# Patient Record
Sex: Female | Born: 1985 | Race: White | Hispanic: No
Health system: Southern US, Community
[De-identification: ages and names within clinical notes are randomized; demographics above are authoritative.]

## PROBLEM LIST (undated history)

## (undated) ENCOUNTER — Inpatient Hospital Stay (HOSPITAL_COMMUNITY): Payer: Self-pay

## (undated) DIAGNOSIS — Z8719 Personal history of other diseases of the digestive system: Secondary | ICD-10-CM

## (undated) DIAGNOSIS — G479 Sleep disorder, unspecified: Secondary | ICD-10-CM

## (undated) DIAGNOSIS — O99419 Diseases of the circulatory system complicating pregnancy, unspecified trimester: Secondary | ICD-10-CM

## (undated) DIAGNOSIS — R002 Palpitations: Secondary | ICD-10-CM

## (undated) DIAGNOSIS — I10 Essential (primary) hypertension: Secondary | ICD-10-CM

## (undated) DIAGNOSIS — M419 Scoliosis, unspecified: Secondary | ICD-10-CM

## (undated) DIAGNOSIS — I059 Rheumatic mitral valve disease, unspecified: Secondary | ICD-10-CM

## (undated) DIAGNOSIS — F419 Anxiety disorder, unspecified: Secondary | ICD-10-CM

## (undated) HISTORY — DX: Rheumatic mitral valve disease, unspecified: I05.9

## (undated) HISTORY — DX: Sleep disorder, unspecified: G47.9

## (undated) HISTORY — DX: Rheumatic mitral valve disease, unspecified: O99.419

## (undated) HISTORY — DX: Palpitations: R00.2

---

## 2012-05-18 ENCOUNTER — Emergency Department (HOSPITAL_COMMUNITY)
Admission: EM | Admit: 2012-05-18 | Discharge: 2012-05-18 | Discharge status: Against Medical Advice | Payer: 59 | Attending: Emergency Medicine | Admitting: Emergency Medicine

## 2012-05-18 ENCOUNTER — Encounter (HOSPITAL_COMMUNITY): Payer: Self-pay | Admitting: *Deleted

## 2012-05-18 DIAGNOSIS — R300 Dysuria: Secondary | ICD-10-CM | POA: Insufficient documentation

## 2012-05-18 DIAGNOSIS — M549 Dorsalgia, unspecified: Secondary | ICD-10-CM | POA: Insufficient documentation

## 2012-05-18 HISTORY — DX: Scoliosis, unspecified: M41.9

## 2012-05-18 LAB — URINALYSIS, ROUTINE W REFLEX MICROSCOPIC
Leukocytes, UA: NEGATIVE
Protein, ur: NEGATIVE mg/dL
Specific Gravity, Urine: 1.022 (ref 1.005–1.030)
Urobilinogen, UA: 0.2 mg/dL (ref 0.0–1.0)

## 2012-05-18 LAB — POCT PREGNANCY, URINE: Preg Test, Ur: NEGATIVE

## 2012-05-18 NOTE — ED Notes (Signed)
PT reports back pain that radiates into both legs. PT also reports dysuria, blood in stools ,sinus pain and increased gas. PCP last seen 3 weeks ago.

## 2012-05-24 ENCOUNTER — Ambulatory Visit
Admission: RE | Admit: 2012-05-24 | Discharge: 2012-05-24 | Disposition: A | Discharge status: Home / Self Care | Payer: 59 | Source: Ambulatory Visit | Attending: Family Medicine | Admitting: Family Medicine

## 2012-05-24 ENCOUNTER — Other Ambulatory Visit: Payer: Self-pay | Admitting: Family Medicine

## 2012-05-24 DIAGNOSIS — M545 Low back pain: Secondary | ICD-10-CM

## 2012-07-26 ENCOUNTER — Ambulatory Visit (INDEPENDENT_AMBULATORY_CARE_PROVIDER_SITE_OTHER): Payer: 59 | Admitting: Internal Medicine

## 2012-07-26 ENCOUNTER — Encounter: Payer: Self-pay | Admitting: *Deleted

## 2012-07-26 ENCOUNTER — Encounter: Payer: Self-pay | Admitting: Internal Medicine

## 2012-07-26 VITALS — BP 162/89 | HR 96 | Ht 63.0 in | Wt 174.4 lb

## 2012-07-26 DIAGNOSIS — I059 Rheumatic mitral valve disease, unspecified: Secondary | ICD-10-CM

## 2012-07-26 DIAGNOSIS — I340 Nonrheumatic mitral (valve) insufficiency: Secondary | ICD-10-CM

## 2012-07-26 NOTE — Patient Instructions (Addendum)
Your physician has requested that you have an echocardiogram. Echocardiography is a painless test that uses sound waves to create images of your heart. It provides your doctor with information about the size and shape of your heart and how well your heart's chambers and valves are working. This procedure takes approximately one hour. There are no restrictions for this procedure.   

## 2012-07-26 NOTE — Progress Notes (Signed)
HPI Patient says when she was notcing palpitations more.  Feels weird flopping.  Notices while resting.  Feels pounding. Does not feel them when she is active.  Denies SOB with activity.  No dizziness.   Had when had acid reflux.  Feels may be worse. Anxiety in past would make worse. Does water aerobics 3x per wk  An hour each visit.  No problems  No SOB  No palpitatons.  No chest tightness.  Allergies  Allergen Reactions  . Amoxicillin Itching    Current Outpatient Prescriptions  Medication Sig Dispense Refill  . bifidobacterium infantis (ALIGN) capsule Take 1 capsule by mouth daily.      . cetirizine (ZYRTEC) 10 MG tablet Take 10 mg by mouth daily.      . lansoprazole (PREVACID) 15 MG capsule Take 15 mg by mouth daily.      . Prenatal Vit-Fe Fumarate-FA (PRENATAL MULTIVITAMIN) TABS Take 1 tablet by mouth daily.        Past Medical History  Diagnosis Date  . Hiatal hernia   . Scoliosis   . Palpitations   . Trouble in sleeping     No past surgical history on file.  No family history on file.  History   Social History  . Marital Status: Married    Spouse Name: N/A    Number of Children: N/A  . Years of Education: N/A   Occupational History  . Not on file.   Social History Main Topics  . Smoking status: Never Smoker   . Smokeless tobacco: Not on file  . Alcohol Use: No  . Drug Use: No  . Sexually Active: Yes    Birth Control/ Protection: None   Other Topics Concern  . Not on file   Social History Narrative  . No narrative on file    Review of Systems:  All systems reviewed.  They are negative to the above problem except as previously stated.  Vital Signs: BP 162/89  Pulse 96  Ht 5\' 3"  (1.6 m)  Wt 174 lb 6.4 oz (79.107 kg)  BMI 30.89 kg/m2  Physical Exam Patient is in NAD HEENT:  Normocephalic, atraumatic. EOMI, PERRLA.  Neck: JVP is normal.  No bruits.  Lungs: clear to auscultation. No rales no wheezes.  Heart: Regular rate and rhythm. Normal S1,  S2. No S3.   No significant murmurs. PMI not displaced.  Abdomen:  Supple, nontender. Normal bowel sounds. No masses. No hepatomegaly.  Extremities:   Good distal pulses throughout. No lower extremity edema.  Musculoskeletal :moving all extremities.  Neuro:   alert and oriented x3.  CN II-XII grossly intact.  EKG:  SR  96 bpm.   Assessment and Plan:  Palpitations.  I am not convinced they are hemodynamically signif arrhythmia.  She notices them when she is quiet  Short lived I would recomm staying active.  Get adequate fluids.  Try relaxing 30 min before bedtime with legs up  See if helps  Discuss reflux meds with OB  SHe has had an echo in the past that by report said she had mild to moderate MR  I do not hear today  With that finding I would recomm a repeat echo to evla.

## 2012-07-31 ENCOUNTER — Ambulatory Visit (HOSPITAL_COMMUNITY): Payer: 59 | Attending: Internal Medicine

## 2012-07-31 DIAGNOSIS — Z331 Pregnant state, incidental: Secondary | ICD-10-CM | POA: Insufficient documentation

## 2012-07-31 DIAGNOSIS — R002 Palpitations: Secondary | ICD-10-CM

## 2012-07-31 DIAGNOSIS — I340 Nonrheumatic mitral (valve) insufficiency: Secondary | ICD-10-CM

## 2012-07-31 NOTE — Progress Notes (Signed)
Echocardiogram performed.  

## 2012-08-05 ENCOUNTER — Telehealth: Payer: Self-pay | Admitting: Internal Medicine

## 2012-08-05 NOTE — Telephone Encounter (Signed)
Called patient with echo results.

## 2012-08-05 NOTE — Telephone Encounter (Signed)
PT HAD ECHO 07-31-12, WOULD LIKE RESULTS

## 2012-08-06 LAB — OB RESULTS CONSOLE RUBELLA ANTIBODY, IGM: Rubella: IMMUNE

## 2012-08-06 LAB — OB RESULTS CONSOLE GC/CHLAMYDIA
Chlamydia: NEGATIVE
Gonorrhea: NEGATIVE

## 2012-08-06 LAB — OB RESULTS CONSOLE ANTIBODY SCREEN: Antibody Screen: NEGATIVE

## 2012-08-06 LAB — OB RESULTS CONSOLE ABO/RH

## 2012-10-13 ENCOUNTER — Inpatient Hospital Stay (HOSPITAL_COMMUNITY)
Admission: AD | Admit: 2012-10-13 | Discharge: 2012-10-13 | Disposition: A | Discharge status: Home / Self Care | Payer: 59 | Source: Ambulatory Visit | Attending: Obstetrics and Gynecology | Admitting: Obstetrics and Gynecology

## 2012-10-13 ENCOUNTER — Encounter (HOSPITAL_COMMUNITY): Payer: Self-pay | Admitting: Obstetrics and Gynecology

## 2012-10-13 DIAGNOSIS — K529 Noninfective gastroenteritis and colitis, unspecified: Secondary | ICD-10-CM

## 2012-10-13 DIAGNOSIS — M549 Dorsalgia, unspecified: Secondary | ICD-10-CM | POA: Insufficient documentation

## 2012-10-13 DIAGNOSIS — K5289 Other specified noninfective gastroenteritis and colitis: Secondary | ICD-10-CM

## 2012-10-13 DIAGNOSIS — O210 Mild hyperemesis gravidarum: Secondary | ICD-10-CM | POA: Insufficient documentation

## 2012-10-13 LAB — URINALYSIS, ROUTINE W REFLEX MICROSCOPIC
Glucose, UA: NEGATIVE mg/dL
Hgb urine dipstick: NEGATIVE
Ketones, ur: >80 mg/dL — AB
pH: 6 (ref 5.0–8.0)

## 2012-10-13 MED ORDER — LACTATED RINGERS IV BOLUS (SEPSIS)
1000.0000 mL | Freq: Once | INTRAVENOUS | Status: AC
  Administered 2012-10-13: 1000 mL via INTRAVENOUS

## 2012-10-13 MED ORDER — PROMETHAZINE HCL 25 MG/ML IJ SOLN
25.0000 mg | Freq: Once | INTRAVENOUS | Status: AC
  Administered 2012-10-13: 25 mg via INTRAVENOUS
  Filled 2012-10-13: qty 1

## 2012-10-13 MED ORDER — PROMETHAZINE HCL 25 MG PO TABS: 25.0000 mg | ORAL_TABLET | Freq: Four times a day (QID) | ORAL | Status: DC | PRN

## 2012-10-13 NOTE — MAU Note (Signed)
[name] is [redacted]w[redacted]d presents to MAU with chief complaint of vomiting X 3 days. No diarrhea; however stools are more "loose" than normal. Denies fever, says she is having some back pain. Shes vomited 4 times in the last 24 hours. Denies fever.

## 2012-10-13 NOTE — MAU Provider Note (Signed)
Chief Complaint: Emesis  First Provider Initiated Contact with Patient 10/13/12 0901      SUBJECTIVE HPI: [name] is a 27 y.o. G1P0 at [redacted]w[redacted]d by LMP who presents with vomiting and loose stools X 3 days. Different that N/V of pregnancy. Denies fever, chills, sick contacts, VB or LOF. Reports mild back pain and cramping.   Past Medical History  Diagnosis Date  . Hiatal hernia   . Scoliosis   . Palpitations   . Trouble in sleeping    OB History    Grav Para Term Preterm Abortions TAB SAB Ect Mult Living   1              # Outc Date GA Lbr Len/2nd Wgt Sex Del Anes PTL Lv   1 CUR              Past Surgical History  Procedure Date  . No past surgeries    History   Social History  . Marital Status: Married    Spouse Name: N/A    Number of Children: N/A  . Years of Education: N/A   Occupational History  . Not on file.   Social History Main Topics  . Smoking status: Never Smoker   . Smokeless tobacco: Not on file  . Alcohol Use: No  . Drug Use: No  . Sexually Active: Yes    Birth Control/ Protection: None   Other Topics Concern  . Not on file   Social History Narrative  . No narrative on file   No current facility-administered medications on file prior to encounter.   Current Outpatient Prescriptions on File Prior to Encounter  Medication Sig Dispense Refill  . omeprazole (PRILOSEC OTC) 20 MG tablet Take 20 mg by mouth daily.      . Prenatal Vit-Fe Fumarate-FA (PRENATAL MULTIVITAMIN) TABS Take 1 tablet by mouth daily.      . sertraline (ZOLOFT) 50 MG tablet Take 50 mg by mouth daily.      . promethazine (PHENERGAN) 25 MG tablet Take 1 tablet (25 mg total) by mouth every 6 (six) hours as needed for nausea.  30 tablet  1   Allergies  Allergen Reactions  . Amoxicillin Itching    ROS: Pertinent items in HPI  OBJECTIVE Blood pressure 127/90, pulse 130, temperature 97.9 F (36.6 C), resp. rate 18, last menstrual period 04/17/2012. 12:05 Pulse 96 after IV  fluids. Pulse: 97 GENERAL: Well-developed, well-nourished female in no acute distress, ill-appearing HEENT: Normocephalic HEART: Tachycardic RESP: normal effort ABDOMEN: Soft, non-tender EXTREMITIES: Nontender, no edema NEURO: Alert and oriented SPECULUM EXAM: deferred BIMANUAL: cervix long and closed; uterus S=D. FHR 152 by doppler.  LAB RESULTS Results for orders placed during the hospital encounter of 10/13/12 (from the past 24 hour(s))  URINALYSIS, ROUTINE W REFLEX MICROSCOPIC     Status: Abnormal   Collection Time   10/13/12  8:30 AM      Component Value Range   Color, Urine YELLOW  YELLOW   APPearance CLEAR  CLEAR   Specific Gravity, Urine >1.030 (*) 1.005 - 1.030   pH 6.0  5.0 - 8.0   Glucose, UA NEGATIVE  NEGATIVE mg/dL   Hgb urine dipstick NEGATIVE  NEGATIVE   Bilirubin Urine SMALL (*) NEGATIVE   Ketones, ur >80 (*) NEGATIVE mg/dL   Protein, ur NEGATIVE  NEGATIVE mg/dL   Urobilinogen, UA 0.2  0.0 - 1.0 mg/dL   Nitrite NEGATIVE  NEGATIVE   Leukocytes, UA NEGATIVE  NEGATIVE  IMAGING No results found.  MAU COURSE Feeling better and tolerating POs after Phenergan and IV fluids.   ASSESSMENT 1. Gastroenteritis, acute   2. Other current maternal conditions classifiable elsewhere, antepartum    PLAN Discharge home. Advance diet slowly.      Follow-up Information    Follow up with Oliver Pila, MD. On 10/15/2012.   Contact information:   [address], [address] [place] [address] [phone]       Follow up with [facility] [place] [facility]ERN[facility] ADMISS[facility]. (As needed if symptoms worsen)    Contact information:   [address] [id number] mc [place] [address] [phone]          Medication List     As of 10/13/2012 11:59 AM    TAKE these medications         DICLEGIS 10-10 MG Tbec   Generic drug: Doxylamine-Pyridoxine   Take 1 tablet by mouth 4 (four) times daily as needed. For nausea       omeprazole 20 MG tablet   Commonly known as: PRILOSEC OTC   Take 20 mg by mouth daily.      prenatal multivitamin Tabs   Take 1 tablet by mouth daily.      promethazine 25 MG tablet   Commonly known as: PHENERGAN   Take 1 tablet (25 mg total) by mouth every 6 (six) hours as needed for nausea.      sertraline 50 MG tablet   Commonly known as: ZOLOFT   Take 50 mg by mouth daily.        [place], [name] 10/13/2012  11:59 AM

## 2012-11-05 ENCOUNTER — Other Ambulatory Visit (HOSPITAL_COMMUNITY): Payer: Self-pay | Admitting: Obstetrics and Gynecology

## 2012-11-05 DIAGNOSIS — R1011 Right upper quadrant pain: Secondary | ICD-10-CM

## 2012-11-07 ENCOUNTER — Ambulatory Visit (HOSPITAL_COMMUNITY)
Admission: RE | Admit: 2012-11-07 | Discharge: 2012-11-07 | Disposition: A | Discharge status: Home / Self Care | Payer: 59 | Source: Ambulatory Visit | Attending: Obstetrics and Gynecology | Admitting: Obstetrics and Gynecology

## 2012-11-07 DIAGNOSIS — R1011 Right upper quadrant pain: Secondary | ICD-10-CM | POA: Insufficient documentation

## 2012-11-07 DIAGNOSIS — O99891 Other specified diseases and conditions complicating pregnancy: Secondary | ICD-10-CM | POA: Insufficient documentation

## 2012-11-11 ENCOUNTER — Inpatient Hospital Stay (HOSPITAL_COMMUNITY)
Admission: AD | Admit: 2012-11-11 | Discharge: 2012-11-11 | Disposition: A | Discharge status: Hospice | Payer: 59 | Source: Ambulatory Visit | Attending: Obstetrics and Gynecology | Admitting: Obstetrics and Gynecology

## 2012-11-11 ENCOUNTER — Encounter (HOSPITAL_COMMUNITY): Payer: Self-pay | Admitting: *Deleted

## 2012-11-11 DIAGNOSIS — O99891 Other specified diseases and conditions complicating pregnancy: Secondary | ICD-10-CM | POA: Insufficient documentation

## 2012-11-11 DIAGNOSIS — N949 Unspecified condition associated with female genital organs and menstrual cycle: Secondary | ICD-10-CM

## 2012-11-11 DIAGNOSIS — M545 Low back pain, unspecified: Secondary | ICD-10-CM | POA: Insufficient documentation

## 2012-11-11 DIAGNOSIS — R109 Unspecified abdominal pain: Secondary | ICD-10-CM | POA: Insufficient documentation

## 2012-11-11 HISTORY — DX: Anxiety disorder, unspecified: F41.9

## 2012-11-11 LAB — URINALYSIS, ROUTINE W REFLEX MICROSCOPIC
Bilirubin Urine: NEGATIVE
Glucose, UA: NEGATIVE mg/dL
Hgb urine dipstick: NEGATIVE
Nitrite: NEGATIVE
Specific Gravity, Urine: 1.02 (ref 1.005–1.030)
pH: 6 (ref 5.0–8.0)

## 2012-11-11 NOTE — MAU Provider Note (Signed)
History     CSN: [id number]  Arrival date and time: 11/11/12 1844   First Provider Initiated Contact with Patient 11/11/12 2153      Chief Complaint  Patient presents with  . Abdominal Pain  . Back Pain   HPI [name] is a 27 y.o. G1P0 at [redacted]w[redacted]d who presents to MAU with complaint of lower abdominal and lower back pain. The patient states that this has been going on since Friday. She feels that it occurs when she changes positions or strains her muscles in her lower abdomen or if she is standing for a long period of time. She does not have any pain right now. She denies vaginal bleeding, abnormal discharge, LOF, UTI symptoms or diarrhea. She has had some nausea without vomiting, however this has been consistent throughout the pregnancy. She reports occasional tightening of the upper abdomen. She feels that her PO hydration has been adequate, although she does drink juice instead of water some of the time. She reports good fetal movement.   OB History   Grav Para Term Preterm Abortions TAB SAB Ect Mult Living   1               Past Medical History  Diagnosis Date  . Hiatal hernia   . Scoliosis   . Palpitations   . Trouble in sleeping   . Anxiety     Past Surgical History  Procedure Laterality Date  . No past surgeries      Family History  Problem Relation Age of Onset  . Diabetes Mother   . Hypertension Mother   . Hypertension Father     History  Substance Use Topics  . Smoking status: Never Smoker   . Smokeless tobacco: Not on file  . Alcohol Use: No    Allergies:  Allergies  Allergen Reactions  . Amoxicillin Itching    Prescriptions prior to admission  Medication Sig Dispense Refill  . pantoprazole (PROTONIX) 20 MG tablet Take 20 mg by mouth daily.      . Prenatal Vit-Fe Fumarate-FA (PRENATAL MULTIVITAMIN) TABS Take 1 tablet by mouth daily.      . promethazine (PHENERGAN) 25 MG tablet Take 1 tablet (25 mg total) by mouth every 6 (six) hours as  needed for nausea.  30 tablet  1  . sertraline (ZOLOFT) 50 MG tablet Take 50 mg by mouth daily.        Review of Systems  Constitutional: Negative for fever, chills and malaise/fatigue.  Gastrointestinal: Positive for nausea and abdominal pain. Negative for vomiting and diarrhea.  Genitourinary: Negative for dysuria, urgency and frequency.       Neg - vaginal bleeding Neg- abnormal discharge  Musculoskeletal: Positive for back pain.  Neurological: Negative for tingling, focal weakness and headaches.   Physical Exam   Blood pressure 132/83, pulse 120, temperature 98.6 F (37 C), temperature source Oral, resp. rate 16, height 5' 1.5" (1.562 m), weight 171 lb 6.4 oz (77.747 kg), last menstrual period 05/29/2012, SpO2 100.00%.  Physical Exam  Constitutional: She is oriented to person, place, and time. She appears well-developed and well-nourished. No distress.  HENT:  Head: Normocephalic and atraumatic.  Cardiovascular: Regular rhythm and normal heart sounds.   tachycardic  Respiratory: Effort normal and breath sounds normal. No respiratory distress.  GI: Soft. Bowel sounds are normal. She exhibits no distension and no mass. There is tenderness (mild diffuse tenderness of the lower abdomen). There is no rebound and no guarding.  Neurological: She  is alert and oriented to person, place, and time.  Skin: Skin is warm and dry. No erythema.  Psychiatric: She has a normal mood and affect.  Dilation: Closed Effacement (%): Thick Cervical Position: Posterior Exam by:: Naaman Plummer PA  Results for orders placed during the hospital encounter of 11/11/12 (from the past 24 hour(s))  URINALYSIS, ROUTINE W REFLEX MICROSCOPIC     Status: Abnormal   Collection Time    11/11/12  7:40 PM      Result Value Range   Color, Urine YELLOW  YELLOW   APPearance CLEAR  CLEAR   Specific Gravity, Urine 1.020  1.005 - 1.030   pH 6.0  5.0 - 8.0   Glucose, UA NEGATIVE  NEGATIVE mg/dL   Hgb urine dipstick  NEGATIVE  NEGATIVE   Bilirubin Urine NEGATIVE  NEGATIVE   Ketones, ur 15 (*) NEGATIVE mg/dL   Protein, ur NEGATIVE  NEGATIVE mg/dL   Urobilinogen, UA 0.2  0.0 - 1.0 mg/dL   Nitrite NEGATIVE  NEGATIVE   Leukocytes, UA NEGATIVE  NEGATIVE    MAU Course  Procedures None  MDM Discussed patient with Dr. Ambrose Mantle. He agrees that this is most likely round ligament pain. Patient can take tylenol as needed, warm baths for comfort and follow-up as scheduled in the office this week.   Assessment and Plan  A: Round ligament pain  P: Discharge home Patient may take tylenol PRN for pain Discussed modifying activities that cause more discomfort and warm baths for comfort Patient should keep follow-up as scheduled in the office later this week Patient may return to MAU as needed or if her condition were to change or worsen  Freddi Starr, PA-C  11/11/2012, 9:53 PM

## 2012-11-11 NOTE — MAU Note (Signed)
Patient states she has been having lower abdominal and mid back pain since 2-21. Pain comes and goes. Has felt tightening in the mid abdomen. Vaginal irritation and buring today. Slight discharge during the pregnancy but the irritation is new. Feeling fetal movement. No bleeding, or vomiting. Has had nausea throughout the pregnancy.

## 2012-11-27 ENCOUNTER — Encounter (HOSPITAL_COMMUNITY): Payer: Self-pay | Admitting: *Deleted

## 2012-11-27 ENCOUNTER — Inpatient Hospital Stay (HOSPITAL_COMMUNITY)
Admission: AD | Admit: 2012-11-27 | Discharge: 2012-11-27 | Disposition: A | Discharge status: Home / Self Care | Payer: 59 | Source: Ambulatory Visit | Attending: Obstetrics and Gynecology | Admitting: Obstetrics and Gynecology

## 2012-11-27 DIAGNOSIS — O209 Hemorrhage in early pregnancy, unspecified: Secondary | ICD-10-CM | POA: Insufficient documentation

## 2012-11-27 DIAGNOSIS — R109 Unspecified abdominal pain: Secondary | ICD-10-CM | POA: Insufficient documentation

## 2012-11-27 DIAGNOSIS — M549 Dorsalgia, unspecified: Secondary | ICD-10-CM

## 2012-11-27 LAB — URINALYSIS, ROUTINE W REFLEX MICROSCOPIC
Glucose, UA: NEGATIVE mg/dL
Hgb urine dipstick: NEGATIVE
Specific Gravity, Urine: <1.005 — ABNORMAL LOW (ref 1.005–1.030)
Urobilinogen, UA: 0.2 mg/dL (ref 0.0–1.0)
pH: 5.5 (ref 5.0–8.0)

## 2012-11-27 LAB — URINE MICROSCOPIC-ADD ON

## 2012-11-27 LAB — WET PREP, GENITAL
Trich, Wet Prep: NONE SEEN
Yeast Wet Prep HPF POC: NONE SEEN

## 2012-11-27 NOTE — MAU Note (Signed)
Patient states she traveled to [place] this past weekend. Has noticed small spots of pink to red blood on the tissue off and on since. States she is having some abdominal cramping and low back pressure. Reports fetal movement.

## 2012-11-27 NOTE — MAU Provider Note (Signed)
History     CSN: [id number]  Arrival date and time: 11/27/12 1191   First Provider Initiated Contact with Patient 11/27/12 1918      Chief Complaint  Patient presents with  . Abdominal Pain  . Back Pain  . Vaginal Discharge   HPI [name] 27 y.o. [redacted]w[redacted]d   Traveled to [place] and back since last Thursday.  Has had some vaginal discharge and periodic spotting of very small amount of blood - dots on panty liner.  No blood yesterday.  Began again today.  Denies dysuria.  Has good fetal movement.  Has noticed a lower abdominal cramping today.  Called the office and was instructed to come to MAU.  OB History   Grav Para Term Preterm Abortions TAB SAB Ect Mult Living   1               Past Medical History  Diagnosis Date  . Hiatal hernia   . Scoliosis   . Palpitations   . Trouble in sleeping   . Anxiety     Past Surgical History  Procedure Laterality Date  . No past surgeries      Family History  Problem Relation Age of Onset  . Diabetes Mother   . Hypertension Mother   . Hypertension Father     History  Substance Use Topics  . Smoking status: Never Smoker   . Smokeless tobacco: Not on file  . Alcohol Use: No    Allergies:  Allergies  Allergen Reactions  . Amoxicillin Itching    Prescriptions prior to admission  Medication Sig Dispense Refill  . cetirizine (ZYRTEC) 10 MG tablet Take 10 mg by mouth daily.      . pantoprazole (PROTONIX) 20 MG tablet Take 20 mg by mouth daily.      . Prenatal Vit-Fe Fumarate-FA (PRENATAL MULTIVITAMIN) TABS Take 1 tablet by mouth daily.      . promethazine (PHENERGAN) 25 MG tablet Take 1 tablet (25 mg total) by mouth every 6 (six) hours as needed for nausea.  30 tablet  1  . sertraline (ZOLOFT) 50 MG tablet Take 50 mg by mouth daily.        Review of Systems  Constitutional: Negative for fever.  Gastrointestinal: Negative for nausea and vomiting.       Abdominal cramping  Genitourinary:       Vaginal discharge. Vaginal  spotting. No dysuria.  Musculoskeletal: Negative for back pain.   Physical Exam   Blood pressure 132/88, pulse 115, temperature 98.1 F (36.7 C), temperature source Oral, resp. rate 16, height 5\' 2"  (1.575 m), weight 177 lb 9.6 oz (80.559 kg), last menstrual period 05/29/2012, SpO2 100.00%.  Physical Exam  Nursing note and vitals reviewed. Constitutional: She is oriented to person, place, and time. She appears well-developed and well-nourished. No distress.  HENT:  Head: Normocephalic.  Eyes: EOM are normal.  Neck: Neck supple.  Cardiovascular: Normal rate, regular rhythm and normal heart sounds.   Respiratory: Effort normal and breath sounds normal. No respiratory distress.  GI: Soft. Bowel sounds are normal. She exhibits no distension and no mass. There is no tenderness. There is no rebound and no guarding.  Genitourinary: Vagina normal. Uterus is enlarged (appropriate for GA). Uterus is not tender. Cervix exhibits discharge (small amount of white mucus discharge noted at the cervical os and in the vagina) and friability. Cervix exhibits no motion tenderness. Right adnexum displays no mass and no tenderness. Left adnexum displays no mass and no  tenderness.    Musculoskeletal: Normal range of motion.  Neurological: She is alert and oriented to person, place, and time.  Skin: Skin is warm and dry.  Psychiatric: She has a normal mood and affect.   Results for orders placed during the hospital encounter of 11/27/12 (from the past 24 hour(s))  URINALYSIS, ROUTINE W REFLEX MICROSCOPIC     Status: Abnormal   Collection Time    11/27/12  6:35 PM      Result Value Range   Color, Urine YELLOW  YELLOW   APPearance HAZY (*) CLEAR   Specific Gravity, Urine <1.005 (*) 1.005 - 1.030   pH 5.5  5.0 - 8.0   Glucose, UA NEGATIVE  NEGATIVE mg/dL   Hgb urine dipstick NEGATIVE  NEGATIVE   Bilirubin Urine NEGATIVE  NEGATIVE   Ketones, ur NEGATIVE  NEGATIVE mg/dL   Protein, ur NEGATIVE  NEGATIVE  mg/dL   Urobilinogen, UA 0.2  0.0 - 1.0 mg/dL   Nitrite NEGATIVE  NEGATIVE   Leukocytes, UA SMALL (*) NEGATIVE  URINE MICROSCOPIC-ADD ON     Status: Abnormal   Collection Time    11/27/12  6:35 PM      Result Value Range   Squamous Epithelial / LPF MANY (*) RARE   WBC, UA 3-6  <3 WBC/hpf   Bacteria, UA RARE  RARE   Urine-Other AMORPHOUS URATES/PHOSPHATES    WET PREP, GENITAL     Status: Abnormal   Collection Time    11/27/12  8:20 PM      Result Value Range   Yeast Wet Prep HPF POC NONE SEEN  NONE SEEN   Trich, Wet Prep NONE SEEN  NONE SEEN   Clue Cells Wet Prep HPF POC NONE SEEN  NONE SEEN   WBC, Wet Prep HPF POC FEW (*) NONE SEEN   Fetal Monitoring: Baseline: 140 bpm, moderate variability, few accelerations, few variable decelerations Contractions: none Reassuring for gestational age  MAU Course  Procedures  MDM 2000 Care assumed by Naaman Plummer, PA 2000 - care assumed by Joseph Berkshire, PA Wet prep performed Discussed patient's lab results and NST with Dr. Ambrose Mantle. He agrees that the patient is not at risk for preterm labor at this time. She may follow-up in the office as scheduled or call sooner if she continues to have concerns.   Assessment and Plan  A: Vaginal bleeding in pregnancy  P: Discharge home Patient should keep follow-up appointment with [facility] as scheduled Patient may call the office if symptoms persist or worsen Patient may return to MAU as needed or if her condition were to change or worsen  Freddi Starr, PA-C  11/27/2012, 8:53 PM

## 2012-11-27 NOTE — MAU Note (Signed)
States having some vaginal irritation.

## 2013-01-07 ENCOUNTER — Inpatient Hospital Stay (HOSPITAL_COMMUNITY)
Admission: AD | Admit: 2013-01-07 | Discharge: 2013-01-08 | Disposition: A | Discharge status: Home / Self Care | Payer: Commercial Indemnity | Source: Ambulatory Visit | Attending: Obstetrics and Gynecology | Admitting: Obstetrics and Gynecology

## 2013-01-07 ENCOUNTER — Encounter (HOSPITAL_COMMUNITY): Payer: Self-pay | Admitting: *Deleted

## 2013-01-07 DIAGNOSIS — O4700 False labor before 37 completed weeks of gestation, unspecified trimester: Secondary | ICD-10-CM | POA: Insufficient documentation

## 2013-01-07 DIAGNOSIS — M545 Low back pain, unspecified: Secondary | ICD-10-CM | POA: Insufficient documentation

## 2013-01-07 DIAGNOSIS — O99891 Other specified diseases and conditions complicating pregnancy: Secondary | ICD-10-CM | POA: Insufficient documentation

## 2013-01-07 DIAGNOSIS — N859 Noninflammatory disorder of uterus, unspecified: Secondary | ICD-10-CM

## 2013-01-07 DIAGNOSIS — R109 Unspecified abdominal pain: Secondary | ICD-10-CM | POA: Insufficient documentation

## 2013-01-07 LAB — URINALYSIS, ROUTINE W REFLEX MICROSCOPIC
Glucose, UA: 100 mg/dL — AB
Leukocytes, UA: NEGATIVE
Nitrite: NEGATIVE
Specific Gravity, Urine: >1.03 — ABNORMAL HIGH (ref 1.005–1.030)
pH: 6 (ref 5.0–8.0)

## 2013-01-07 LAB — FETAL FIBRONECTIN: Fetal Fibronectin: NEGATIVE

## 2013-01-07 MED ORDER — NIFEDIPINE 10 MG PO CAPS: 10.0000 mg | ORAL_CAPSULE | Freq: Once | ORAL | Status: DC

## 2013-01-07 MED ORDER — NIFEDIPINE 10 MG PO CAPS
10.0000 mg | ORAL_CAPSULE | Freq: Three times a day (TID) | ORAL | Status: DC
  Administered 2013-01-07: 10 mg via ORAL
  Filled 2013-01-07: qty 1

## 2013-01-07 MED ORDER — OXYCODONE-ACETAMINOPHEN 5-325 MG PO TABS
1.0000 | ORAL_TABLET | Freq: Once | ORAL | Status: AC
  Administered 2013-01-07: 1 via ORAL
  Filled 2013-01-07: qty 1

## 2013-01-07 MED ORDER — NIFEDIPINE 10 MG PO CAPS
10.0000 mg | ORAL_CAPSULE | Freq: Once | ORAL | Status: AC
  Administered 2013-01-07: 10 mg via ORAL
  Filled 2013-01-07: qty 1

## 2013-01-07 NOTE — MAU Note (Signed)
PT SAYS  SHE STARTED HAVING ABD PAIN LAST NIGHT -  SHE CALLED [facility] TONIGHT-    HAS BEEN IN MAU- FEB-  HERE FOR UC.   LAST SEX- - Sunday.  DENIES HSV AND MRSA.

## 2013-01-07 NOTE — MAU Provider Note (Signed)
History     CSN: [id number]  Arrival date and time: 01/07/13 1943   First Provider Initiated Contact with Patient 01/07/13 2053      No chief complaint on file.  HPI This is a 27 y.o. female at [redacted]w[redacted]d who presents with c/o cramping in her abdomen and low back with "balling up" sensations. + fetal movement. No leaking or bleeding.   RN Note: PT SAYS SHE STARTED HAVING ABD PAIN LAST NIGHT - SHE CALLED [facility] TONIGHT- HAS BEEN IN MAU- FEB- HERE FOR UC. LAST SEX- - Sunday. DENIES HSV AND MRSA.  OB History   Grav Para Term Preterm Abortions TAB SAB Ect Mult Living   1               Past Medical History  Diagnosis Date  . Hiatal hernia   . Scoliosis   . Palpitations   . Trouble in sleeping   . Anxiety     Past Surgical History  Procedure Laterality Date  . No past surgeries      Family History  Problem Relation Age of Onset  . Diabetes Mother   . Hypertension Mother   . Hypertension Father     History  Substance Use Topics  . Smoking status: Never Smoker   . Smokeless tobacco: Not on file  . Alcohol Use: No    Allergies:  Allergies  Allergen Reactions  . Amoxicillin Itching    Prescriptions prior to admission  Medication Sig Dispense Refill  . cetirizine (ZYRTEC) 10 MG tablet Take 10 mg by mouth daily.      . pantoprazole (PROTONIX) 20 MG tablet Take 20 mg by mouth daily.      . Prenatal Vit-Fe Fumarate-FA (PRENATAL MULTIVITAMIN) TABS Take 1 tablet by mouth daily.      . promethazine (PHENERGAN) 25 MG tablet Take 1 tablet (25 mg total) by mouth every 6 (six) hours as needed for nausea.  30 tablet  1  . sertraline (ZOLOFT) 50 MG tablet Take 50 mg by mouth daily.        Review of Systems  Constitutional: Negative for fever, chills and malaise/fatigue.  Gastrointestinal: Positive for abdominal pain. Negative for nausea, vomiting, diarrhea and constipation.  Genitourinary: Negative for dysuria.  Neurological: Negative for dizziness, weakness and  headaches.   Physical Exam   Blood pressure 131/78, pulse 122, temperature 98.3 F (36.8 C), temperature source Oral, resp. rate 20, height 5\' 2"  (1.575 m), weight 189 lb 8 oz (85.957 kg), last menstrual period 05/29/2012.  Physical Exam  Constitutional: She is oriented to person, place, and time. She appears well-developed and well-nourished. No distress.  HENT:  Head: Normocephalic.  Cardiovascular: Normal rate.   Respiratory: Effort normal.  GI: Soft. She exhibits no distension. There is no tenderness. There is no rebound and no guarding.  Genitourinary: Vagina normal and uterus normal. No vaginal discharge found.  Cervix closed, soft, maybe 30-40%/-2/vertex  Musculoskeletal: Normal range of motion.  Neurological: She is alert and oriented to person, place, and time.  Skin: Skin is warm and dry.  Psychiatric: She has a normal mood and affect.  Fetal heart rated reassuring Uterine irritability, 10 second cramps frequently  Results for orders placed during the hospital encounter of 01/07/13 (from the past 24 hour(s))  URINALYSIS, ROUTINE W REFLEX MICROSCOPIC     Status: Abnormal   Collection Time    01/07/13  7:57 PM      Result Value Range   Color, Urine YELLOW  YELLOW  APPearance CLEAR  CLEAR   Specific Gravity, Urine >1.030 (*) 1.005 - 1.030   pH 6.0  5.0 - 8.0   Glucose, UA 100 (*) NEGATIVE mg/dL   Hgb urine dipstick NEGATIVE  NEGATIVE   Bilirubin Urine NEGATIVE  NEGATIVE   Ketones, ur 15 (*) NEGATIVE mg/dL   Protein, ur NEGATIVE  NEGATIVE mg/dL   Urobilinogen, UA 0.2  0.0 - 1.0 mg/dL   Nitrite NEGATIVE  NEGATIVE   Leukocytes, UA NEGATIVE  NEGATIVE  FETAL FIBRONECTIN     Status: None   Collection Time    01/07/13  9:05 PM      Result Value Range   Fetal Fibronectin NEGATIVE  NEGATIVE    MAU Course  Procedures  MDM Consulted Dr Jackelyn Knife. FFn done  >>  Negative.  Given 2 doses of Procardia,  He states no need to do cervical length if FFn is negative.  >>  Irritability subsided after second dose.    Assessment and Plan  A:  SIUP at [redacted]w[redacted]d        Uterine irritability, possibly related to dehydration         P:  Discharge home      PO hydration to clear urine color      Call Office in am if still cramping  [facility] 01/07/2013, 9:19 PM

## 2013-01-08 NOTE — Progress Notes (Signed)
FHT from 4-22 reviewed, reactive NST, irreg ctx, ctx spaced out.

## 2013-02-21 ENCOUNTER — Inpatient Hospital Stay (HOSPITAL_COMMUNITY)
Admission: AD | Admit: 2013-02-21 | Discharge: 2013-02-21 | Disposition: A | Discharge status: Home / Self Care | Payer: Managed Care, Other (non HMO) | Source: Ambulatory Visit | Attending: Obstetrics and Gynecology | Admitting: Obstetrics and Gynecology

## 2013-02-21 ENCOUNTER — Encounter (HOSPITAL_COMMUNITY): Payer: Self-pay

## 2013-02-21 ENCOUNTER — Inpatient Hospital Stay (HOSPITAL_COMMUNITY): Payer: Managed Care, Other (non HMO)

## 2013-02-21 DIAGNOSIS — O368390 Maternal care for abnormalities of the fetal heart rate or rhythm, unspecified trimester, not applicable or unspecified: Secondary | ICD-10-CM | POA: Insufficient documentation

## 2013-02-21 DIAGNOSIS — O4700 False labor before 37 completed weeks of gestation, unspecified trimester: Secondary | ICD-10-CM | POA: Insufficient documentation

## 2013-02-21 LAB — OB RESULTS CONSOLE GBS: GBS: POSITIVE

## 2013-02-21 NOTE — Progress Notes (Signed)
FHT from this am reviewed.  Reassuring but nmot reactive, occasional variable decel, min-mod variability.  BPP 8/8.

## 2013-02-21 NOTE — MAU Note (Signed)
Contractions every 4 minutes since 1 am this morning. Denies leaking of fluid or vaginal bleeding. Positive fetal movement. Negative pre E labs in office yesterday.

## 2013-03-03 ENCOUNTER — Encounter (HOSPITAL_COMMUNITY): Payer: Self-pay | Admitting: *Deleted

## 2013-03-03 ENCOUNTER — Telehealth (HOSPITAL_COMMUNITY): Payer: Self-pay | Admitting: *Deleted

## 2013-03-03 NOTE — Telephone Encounter (Signed)
Preadmission screen  

## 2013-03-04 ENCOUNTER — Encounter (HOSPITAL_COMMUNITY)
Admission: RE | Disposition: A | Discharge status: Home / Self Care | Payer: Self-pay | Source: Ambulatory Visit | Attending: Obstetrics and Gynecology

## 2013-03-04 ENCOUNTER — Encounter (HOSPITAL_COMMUNITY): Payer: Self-pay | Admitting: Anesthesiology

## 2013-03-04 ENCOUNTER — Inpatient Hospital Stay (HOSPITAL_COMMUNITY): Payer: Managed Care, Other (non HMO) | Admitting: Anesthesiology

## 2013-03-04 ENCOUNTER — Inpatient Hospital Stay (HOSPITAL_COMMUNITY)
Admission: RE | Admit: 2013-03-04 | Discharge: 2013-03-07 | DRG: 766 | Disposition: A | Discharge status: Home / Self Care | Payer: Managed Care, Other (non HMO) | Source: Ambulatory Visit | Attending: Obstetrics and Gynecology | Admitting: Obstetrics and Gynecology

## 2013-03-04 ENCOUNTER — Encounter (HOSPITAL_COMMUNITY): Payer: Self-pay

## 2013-03-04 VITALS — BP 124/86 | HR 97 | Temp 98.4°F | Resp 18 | Ht 63.0 in | Wt 192.0 lb

## 2013-03-04 DIAGNOSIS — Z98891 History of uterine scar from previous surgery: Secondary | ICD-10-CM

## 2013-03-04 DIAGNOSIS — Z22330 Carrier of Group B streptococcus: Secondary | ICD-10-CM

## 2013-03-04 DIAGNOSIS — O99892 Other specified diseases and conditions complicating childbirth: Secondary | ICD-10-CM | POA: Diagnosis present

## 2013-03-04 LAB — CBC
HCT: 33.6 % — ABNORMAL LOW (ref 36.0–46.0)
Hemoglobin: 10.7 g/dL — ABNORMAL LOW (ref 12.0–15.0)
MCH: 25.5 pg — ABNORMAL LOW (ref 26.0–34.0)
MCHC: 31.8 g/dL (ref 30.0–36.0)
MCV: 80 fL (ref 78.0–100.0)
RDW: 14.9 % (ref 11.5–15.5)

## 2013-03-04 SURGERY — Surgical Case
Anesthesia: Epidural

## 2013-03-04 MED ORDER — MORPHINE SULFATE 0.5 MG/ML IJ SOLN
INTRAMUSCULAR | Status: AC
  Filled 2013-03-04: qty 10

## 2013-03-04 MED ORDER — ACETAMINOPHEN 325 MG PO TABS: 650.0000 mg | ORAL_TABLET | ORAL | Status: DC | PRN

## 2013-03-04 MED ORDER — OXYTOCIN 40 UNITS IN LACTATED RINGERS INFUSION - SIMPLE MED: 62.5000 mL/h | INTRAVENOUS | Status: DC

## 2013-03-04 MED ORDER — SODIUM BICARBONATE 8.4 % IV SOLN
INTRAVENOUS | Status: AC
  Filled 2013-03-04: qty 50

## 2013-03-04 MED ORDER — CITRIC ACID-SODIUM CITRATE 334-500 MG/5ML PO SOLN
30.0000 mL | ORAL | Status: DC | PRN
  Administered 2013-03-04: 30 mL via ORAL
  Filled 2013-03-04: qty 15

## 2013-03-04 MED ORDER — LACTATED RINGERS IV SOLN
INTRAVENOUS | Status: DC
  Administered 2013-03-04 (×6): via INTRAVENOUS

## 2013-03-04 MED ORDER — LACTATED RINGERS IV SOLN
500.0000 mL | Freq: Once | INTRAVENOUS | Status: AC
  Administered 2013-03-04: 500 mL via INTRAVENOUS

## 2013-03-04 MED ORDER — MORPHINE SULFATE (PF) 0.5 MG/ML IJ SOLN
INTRAMUSCULAR | Status: DC | PRN
  Administered 2013-03-04: 4 ug via EPIDURAL

## 2013-03-04 MED ORDER — PHENYLEPHRINE 40 MCG/ML (10ML) SYRINGE FOR IV PUSH (FOR BLOOD PRESSURE SUPPORT)
PREFILLED_SYRINGE | INTRAVENOUS | Status: AC
  Filled 2013-03-04: qty 5

## 2013-03-04 MED ORDER — DIPHENHYDRAMINE HCL 50 MG/ML IJ SOLN: 12.5000 mg | INTRAMUSCULAR | Status: DC | PRN

## 2013-03-04 MED ORDER — LACTATED RINGERS IV SOLN
INTRAVENOUS | Status: DC | PRN
  Administered 2013-03-04: 21:00:00 via INTRAVENOUS

## 2013-03-04 MED ORDER — CLINDAMYCIN PHOSPHATE 900 MG/50ML IV SOLN
900.0000 mg | Freq: Three times a day (TID) | INTRAVENOUS | Status: DC
  Administered 2013-03-04 (×2): 900 mg via INTRAVENOUS
  Filled 2013-03-04 (×5): qty 50

## 2013-03-04 MED ORDER — OXYTOCIN 10 UNIT/ML IJ SOLN
40.0000 [IU] | INTRAMUSCULAR | Status: DC | PRN
  Administered 2013-03-04: 21:00:00 via INTRAVENOUS

## 2013-03-04 MED ORDER — ONDANSETRON HCL 4 MG/2ML IJ SOLN: 4.0000 mg | Freq: Four times a day (QID) | INTRAMUSCULAR | Status: DC | PRN

## 2013-03-04 MED ORDER — PHENYLEPHRINE 40 MCG/ML (10ML) SYRINGE FOR IV PUSH (FOR BLOOD PRESSURE SUPPORT)
80.0000 ug | PREFILLED_SYRINGE | INTRAVENOUS | Status: DC | PRN
  Filled 2013-03-04: qty 5

## 2013-03-04 MED ORDER — SODIUM BICARBONATE 8.4 % IV SOLN
INTRAVENOUS | Status: DC | PRN
  Administered 2013-03-04: 5 mL via EPIDURAL

## 2013-03-04 MED ORDER — ONDANSETRON HCL 4 MG/2ML IJ SOLN
INTRAMUSCULAR | Status: DC | PRN
  Administered 2013-03-04: 4 mg via INTRAVENOUS

## 2013-03-04 MED ORDER — PHENYLEPHRINE 40 MCG/ML (10ML) SYRINGE FOR IV PUSH (FOR BLOOD PRESSURE SUPPORT): 80.0000 ug | PREFILLED_SYRINGE | INTRAVENOUS | Status: DC | PRN

## 2013-03-04 MED ORDER — LIDOCAINE HCL (PF) 1 % IJ SOLN: 30.0000 mL | INTRAMUSCULAR | Status: DC | PRN

## 2013-03-04 MED ORDER — EPHEDRINE 5 MG/ML INJ
10.0000 mg | INTRAVENOUS | Status: DC | PRN
  Filled 2013-03-04: qty 4

## 2013-03-04 MED ORDER — GENTAMICIN SULFATE 40 MG/ML IJ SOLN
330.0000 mg | Freq: Once | INTRAVENOUS | Status: DC
  Filled 2013-03-04: qty 8.25

## 2013-03-04 MED ORDER — IBUPROFEN 600 MG PO TABS: 600.0000 mg | ORAL_TABLET | Freq: Four times a day (QID) | ORAL | Status: DC | PRN

## 2013-03-04 MED ORDER — LACTATED RINGERS IV SOLN: 500.0000 mL | INTRAVENOUS | Status: DC | PRN

## 2013-03-04 MED ORDER — SCOPOLAMINE 1 MG/3DAYS TD PT72: 1.0000 | MEDICATED_PATCH | Freq: Once | TRANSDERMAL | Status: DC

## 2013-03-04 MED ORDER — OXYTOCIN 40 UNITS IN LACTATED RINGERS INFUSION - SIMPLE MED
1.0000 m[IU]/min | INTRAVENOUS | Status: DC
  Administered 2013-03-04: 1 m[IU]/min via INTRAVENOUS
  Filled 2013-03-04: qty 1000

## 2013-03-04 MED ORDER — MEPERIDINE HCL 25 MG/ML IJ SOLN: 6.2500 mg | INTRAMUSCULAR | Status: DC | PRN

## 2013-03-04 MED ORDER — EPHEDRINE 5 MG/ML INJ: 10.0000 mg | INTRAVENOUS | Status: DC | PRN

## 2013-03-04 MED ORDER — FENTANYL CITRATE 0.05 MG/ML IJ SOLN: 25.0000 ug | INTRAMUSCULAR | Status: DC | PRN

## 2013-03-04 MED ORDER — FENTANYL 2.5 MCG/ML BUPIVACAINE 1/10 % EPIDURAL INFUSION (WH - ANES)
INTRAMUSCULAR | Status: DC | PRN
  Administered 2013-03-04: 14 mL/h via EPIDURAL

## 2013-03-04 MED ORDER — OXYTOCIN BOLUS FROM INFUSION: 500.0000 mL | INTRAVENOUS | Status: DC

## 2013-03-04 MED ORDER — LIDOCAINE HCL (PF) 1 % IJ SOLN
INTRAMUSCULAR | Status: DC | PRN
  Administered 2013-03-04 (×2): 4 mL

## 2013-03-04 MED ORDER — OXYTOCIN 10 UNIT/ML IJ SOLN
INTRAMUSCULAR | Status: AC
  Filled 2013-03-04: qty 4

## 2013-03-04 MED ORDER — KETOROLAC TROMETHAMINE 60 MG/2ML IM SOLN: 60.0000 mg | Freq: Once | INTRAMUSCULAR | Status: AC | PRN

## 2013-03-04 MED ORDER — SCOPOLAMINE 1 MG/3DAYS TD PT72
MEDICATED_PATCH | TRANSDERMAL | Status: AC
  Administered 2013-03-04: 1.5 mg via TRANSDERMAL
  Filled 2013-03-04: qty 1

## 2013-03-04 MED ORDER — FENTANYL 2.5 MCG/ML BUPIVACAINE 1/10 % EPIDURAL INFUSION (WH - ANES)
14.0000 mL/h | INTRAMUSCULAR | Status: DC | PRN
  Filled 2013-03-04: qty 125

## 2013-03-04 MED ORDER — ONDANSETRON HCL 4 MG/2ML IJ SOLN
INTRAMUSCULAR | Status: AC
  Filled 2013-03-04: qty 2

## 2013-03-04 MED ORDER — TERBUTALINE SULFATE 1 MG/ML IJ SOLN: 0.2500 mg | Freq: Once | INTRAMUSCULAR | Status: AC | PRN

## 2013-03-04 MED ORDER — OXYCODONE-ACETAMINOPHEN 5-325 MG PO TABS: 1.0000 | ORAL_TABLET | ORAL | Status: DC | PRN

## 2013-03-04 MED ORDER — KETOROLAC TROMETHAMINE 60 MG/2ML IM SOLN
INTRAMUSCULAR | Status: AC
  Administered 2013-03-04: 60 mg via INTRAMUSCULAR
  Filled 2013-03-04: qty 2

## 2013-03-04 MED ORDER — LIDOCAINE-EPINEPHRINE (PF) 2 %-1:200000 IJ SOLN
INTRAMUSCULAR | Status: AC
  Filled 2013-03-04: qty 20

## 2013-03-04 MED ORDER — PHENYLEPHRINE HCL 10 MG/ML IJ SOLN
INTRAMUSCULAR | Status: DC | PRN
  Administered 2013-03-04: 40 ug via INTRAVENOUS
  Administered 2013-03-04: 80 ug via INTRAVENOUS
  Administered 2013-03-04 (×2): 40 ug via INTRAVENOUS

## 2013-03-04 SURGICAL SUPPLY — 31 items
CLAMP CORD UMBIL (MISCELLANEOUS) IMPLANT
CLOTH BEACON ORANGE TIMEOUT ST (SAFETY) ×2 IMPLANT
CONTAINER PREFILL 10% NBF 15ML (MISCELLANEOUS) IMPLANT
DRAPE LG THREE QUARTER DISP (DRAPES) ×2 IMPLANT
DRSG OPSITE POSTOP 4X10 (GAUZE/BANDAGES/DRESSINGS) ×2 IMPLANT
DRSG VASELINE 3X18 (GAUZE/BANDAGES/DRESSINGS) ×2 IMPLANT
DURAPREP 26ML APPLICATOR (WOUND CARE) ×2 IMPLANT
ELECT REM PT RETURN 9FT ADLT (ELECTROSURGICAL) ×2
ELECTRODE REM PT RTRN 9FT ADLT (ELECTROSURGICAL) ×1 IMPLANT
EXTRACTOR VACUUM KIWI (MISCELLANEOUS) IMPLANT
EXTRACTOR VACUUM M CUP 4 TUBE (SUCTIONS) IMPLANT
GLOVE BIO SURGEON STRL SZ7.5 (GLOVE) ×2 IMPLANT
GOWN PREVENTION PLUS XLARGE (GOWN DISPOSABLE) ×2 IMPLANT
GOWN STRL REIN XL XLG (GOWN DISPOSABLE) ×4 IMPLANT
KIT ABG SYR 3ML LUER SLIP (SYRINGE) IMPLANT
NEEDLE HYPO 25X5/8 SAFETYGLIDE (NEEDLE) IMPLANT
NS IRRIG 1000ML POUR BTL (IV SOLUTION) ×2 IMPLANT
PACK C SECTION WH (CUSTOM PROCEDURE TRAY) ×2 IMPLANT
PAD OB MATERNITY 4.3X12.25 (PERSONAL CARE ITEMS) ×2 IMPLANT
RTRCTR C-SECT PINK 25CM LRG (MISCELLANEOUS) IMPLANT
STAPLER VISISTAT 35W (STAPLE) IMPLANT
SUT PLAIN 0 NONE (SUTURE) IMPLANT
SUT VIC AB 0 CT1 36 (SUTURE) ×16 IMPLANT
SUT VIC AB 3-0 CTX 36 (SUTURE) ×2 IMPLANT
SUT VIC AB 3-0 SH 27 (SUTURE)
SUT VIC AB 3-0 SH 27X BRD (SUTURE) IMPLANT
SUT VIC AB 4-0 KS 27 (SUTURE) IMPLANT
SUT VICRYL 0 TIES 12 18 (SUTURE) IMPLANT
TOWEL OR 17X24 6PK STRL BLUE (TOWEL DISPOSABLE) ×6 IMPLANT
TRAY FOLEY CATH 14FR (SET/KITS/TRAYS/PACK) ×2 IMPLANT
WATER STERILE IRR 1000ML POUR (IV SOLUTION) ×2 IMPLANT

## 2013-03-04 NOTE — Progress Notes (Signed)
Patient ID: [name], female   DOB: Feb 05, 1986, 27 y.o.   MRN: [id number] Pitocin at 9 mu/ minute and some contractions that are becoming more painful Cervix 3 cm 50% effaced and thje vertex is at - 2 station. AROM produced lightly meconium stained fluid.

## 2013-03-04 NOTE — Op Note (Signed)
[name], [name]               ACCOUNT NO.:  [id number]  MEDICAL RECORD NO.:  [id number]  LOCATION:  [facility]                          FACILITY:  [facility]  PHYSICIAN:  Malachi Pro. Ambrose Mantle, M.D. DATE OF BIRTH:  05/13/1986  DATE OF PROCEDURE:  03/04/2013 DATE OF DISCHARGE:                              OPERATIVE REPORT   PREOPERATIVE DIAGNOSES: 1. Intrauterine pregnancy at 39 plus weeks. 2. Second stage of labor. 3. Deep deceleration of the fetal heart rate in spite of proper     positioning oxygen and stopping Pitocin. 4. The vertex was at a 0 to +1 station, so I did not feel the baby was     deliverable vaginally.  I took the patient for immediate cesarean     section.  POSTOPERATIVE DIAGNOSES: 1. Intrauterine pregnancy at 39 plus weeks. 2. Second stage of labor. 3. Deep deceleration of the fetal heart rate in spite of proper     positioning oxygen and stopping Pitocin. 4. The vertex was at a 0 to +1 station, so I did not feel the baby was     deliverable vaginally.  I took the patient for immediate cesarean     section. 5. Thick meconium staining.  OPERATION:  Low-transverse cervical cesarean section.  OPERATOR:  Malachi Pro. Ambrose Mantle, MD  ANESTHESIA:  Epidural anesthesia.  DESCRIPTION OF PROCEDURE:  The patient was brought to the operating room and while anesthesia was being confirmed, I pushed the uterus far to the left and auscultated the heart rate with the external monitor and the heart rate was acceptable.  There were decelerations, but recovery. Once Dr. Rodman Pickle told me I could prep the abdomen, I scrubbed, prepped the abdomen with Betadine solution and draped it as a sterile field.  I did a time-out.  Anesthesia was confirmed.  I made a transverse incision through the skin, subcutaneous tissue, and fascia.  The fascia was separated from the rectus muscles superiorly and inferiorly.  Peritoneum was opened.  Lower uterine segment was exposed.  A short transverse incision was  made through the superficial layers of the myometrium.  I went the rest away into the amniotic sac with my finger, got a lot of thick meconium-stained fluid, pulled superiorly and inferiorly on the incision, reached out in the pelvis, delivered the baby.  After the head was delivered, the baby cried immediately.  I suctioned the nose and mouth, delivered the rest of the baby.  Clamped the cord, cut it, and gave the baby to Dr. Joana Reamer who was in attendance.  She assigned the Apgars and we agreed that the pH was not necessary.  Routine cord blood studies were obtained.  The placenta was removed intact.  The inside of the uterus was inspected, found to be free of any debris.  I did remove some extra membranes that were meconium stained.  I then closed the uterus in 2 layers using a running lock suture of 0 Vicryl on the first layer, nonlocking suture of the same material on the second layer.  I liberally irrigated the entire pelvis, confirmed hemostasis, inspected the uterus, tubes, and ovaries which were normal, and then closed the abdominal wall with interrupted sutures  of 0 Vicryl on the rectus muscle and peritoneum in 1 layer, 2 running sutures of 0 Vicryl on the fascia, running 3-0 Vicryl on the subcutaneous tissue, and staples on the skin. The patient seemed to tolerate the procedure well.  I do not have the Apgars, but the baby did fine.  Blood loss was estimated at 1000 mL. Sponge and needle counts were correct.  The fetal scalp electrode was left, so that it came in through the incision.  I had the nurse remove the scalp electrode through the vagina.  Sponge and needle counts were correct and the patient was returned to recovery in satisfactory condition.     Malachi Pro. Ambrose Mantle, M.D.     TFH/MEDQ  D:  03/04/2013  T:  03/04/2013  Job:  [id number]

## 2013-03-04 NOTE — Anesthesia Preprocedure Evaluation (Signed)
Anesthesia Evaluation    Airway Mallampati: III TM Distance: >3 FB Neck ROM: Full    Dental no notable dental hx. (+) Teeth Intact   Pulmonary  breath sounds clear to auscultation  Pulmonary exam normal       Cardiovascular negative cardio ROS  Rhythm:Regular Rate:Normal     Neuro/Psych PSYCHIATRIC DISORDERS  Neuromuscular disease    GI/Hepatic Neg liver ROS, hiatal hernia,   Endo/Other  negative endocrine ROS  Renal/GU negative Renal ROS  negative genitourinary   Musculoskeletal   Abdominal   Peds  Hematology negative hematology ROS (+)   Anesthesia Other Findings   Reproductive/Obstetrics (+) Pregnancy (fetal distress --> stat c/s)                           Anesthesia Physical  Anesthesia Plan  ASA: II and emergent  Anesthesia Plan: Epidural   Post-op Pain Management:    Induction:   Airway Management Planned: Natural Airway  Additional Equipment:   Intra-op Plan:   Post-operative Plan:   Informed Consent: I have reviewed the patients History and Physical, chart, labs and discussed the procedure including the risks, benefits and alternatives for the proposed anesthesia with the patient or authorized representative who has indicated his/her understanding and acceptance.     Plan Discussed with: Anesthesiologist, Surgeon and CRNA  Anesthesia Plan Comments:         Anesthesia Quick Evaluation

## 2013-03-04 NOTE — Consult Note (Signed)
Neonatology Note:   Attendance at C-section:    I was asked by Dr. Ambrose Mantle to attend this Stat primary C/S at term due to fetal HR decelerations. The mother is a G1P1 O pos, GBS pos with borderline HTN late in the pregnancy, being induced. ROM 9 hours prior to delivery, fluid with thin meconium. The mother got Clindamycin > 4 hours prior to delivery and was afebrile during labor. Infant with decreased tone but a spontaneous cry. We did bulb suctioning and gave stimulation. He cried well, then became somewhat quiet at about 3 minutes with slightly dusky color. We gave stimulation and he pinked up well after that. Tone was very good by 5 minutes. Ap 7/9. Lungs clear to ausc in DR. To CN to care of Pediatrician.   Doretha Sou, MD

## 2013-03-04 NOTE — Anesthesia Procedure Notes (Signed)
Epidural Patient location during procedure: OB Start time: 03/04/2013 2:22 PM  Staffing Anesthesiologist: Majel Giel A. Performed by: anesthesiologist   Preanesthetic Checklist Completed: patient identified, site marked, surgical consent, pre-op evaluation, timeout performed, IV checked, risks and benefits discussed and monitors and equipment checked  Epidural Patient position: sitting Prep: site prepped and draped and DuraPrep Patient monitoring: continuous pulse ox and blood pressure Approach: midline Injection technique: LOR air  Needle:  Needle type: Tuohy  Needle gauge: 17 G Needle length: 9 cm and 9 Needle insertion depth: 6 cm Catheter type: closed end flexible Catheter size: 19 Gauge Catheter at skin depth: 11 cm Test dose: negative and Other  Assessment Events: blood not aspirated, injection not painful, no injection resistance, negative IV test and no paresthesia  Additional Notes Patient identified. Risks and benefits discussed including failed block, incomplete  Pain control, post dural puncture headache, nerve damage, paralysis, blood pressure Changes, nausea, vomiting, reactions to medications-both toxic and allergic and post Partum back pain. All questions were answered. Patient expressed understanding and wished to proceed. Sterile technique was used throughout procedure. Epidural site was Dressed with sterile barrier dressing. No paresthesias, signs of intravascular injection Or signs of intrathecal spread were encountered.  Patient was more comfortable after the epidural was dosed. Please see RN's note for documentation of vital signs and FHR which are stable.

## 2013-03-04 NOTE — Progress Notes (Signed)
Patient ID: [name], female   DOB: 1986/04/10, 27 y.o.   MRN: [id number] Contractions are painful and pt has asked for an epidural. The cervix is 3-4- cm 70% effaced and the vertex is at - 2 station. She may receive the epidural

## 2013-03-04 NOTE — Progress Notes (Signed)
Patient ID: [name], female   DOB: September 16, 1986, 27 y.o.   MRN: [id number] I was called around 8:05 PM and was told the baby had experienced some deep decelerations that had been corrected with positioning. I was told the cervix was 10 cm dilated and the vertex was at +1 station. I came to the hospital immediately and the FHR had continued to have decelerations in spite of O2 ,positioning and stopping the pitocin.I did not want the pt to push with the bradycardia and when my exam confirmed the nurse's exam I recommended immediate c section.

## 2013-03-04 NOTE — Progress Notes (Signed)
Patient ID: [name], female   DOB: 1986/03/09, 27 y.o.   MRN: [id number] Pt received her epidural and was 6 cm at 5:05 PM Now, she is 8 cm 100% effaced and the vertex is at 0 station

## 2013-03-04 NOTE — H&P (Signed)
[name], [name]               ACCOUNT NO.:  [id number]  MEDICAL RECORD NO.:  [id number]  LOCATION:                                 FACILITY:  PHYSICIAN:  [name], M.D. DATE OF BIRTH:  Oct 20, 1985  DATE OF ADMISSION: DATE OF DISCHARGE:                             HISTORY & PHYSICAL   PRESENT ILLNESS:  A 27 year old white female, para 0, gravida 1, EDC at March 06, 2013, admitted for induction of labor.  The patient is O positive with a negative antibody, RPR nonreactive, hepatitis B surface antigen negative, rubella positive, HIV negative, GC and Chlamydia negative.  Urine culture negative.  One-hour Glucola was normal at 131. The patient had a leukocytosis during pregnancy.  I spoke to Dr. Truett [name], hematologist.  He felt that the leukocytosis was due to pregnancy, but if it persisted postpartum, he would need to see her. The patient complained of itching during latter part of pregnancy and requested bile acids after first one was normal.  Subsequent bile acids were normal x2.  Group B strep culture was positive.  Her cervix is relatively favorable now and she is admitted for induction of labor.  PAST MEDICAL HISTORY:  Hiatal hernia, mitral valve disorder, scoliosis.  SURGICAL HISTORY:  None.  MEDICATIONS:  Prenatal vitamins.  ALLERGIES:  No latex allergy.  She is allergic to amoxicillin.  SOCIAL HISTORY:  Does not drink.  Does not smoke.  No illicit drugs. She is married.  Has a master's degree.  Self-employed Customer service manager.  The group B strep is susceptible to clindamycin.  Multiple nonstress test for borderline hypertension had been done in late pregnancy and they have been sometimes nonreactive and biophysical profiles have been normal.  PHYSICAL EXAMINATION:  GENERAL:  On admission, well-developed, somewhat obese white female, in no distress. VITAL SIGNS:  Blood pressure 128/90, pulse 80. HEART:  Normal size and sounds.  No murmurs. LUNGS:  Clear to  auscultation. ABDOMEN:  Soft.  Fundal height 37.5 cm.  Fetal heart tones normal. Cervix 2 cm, 40% vertex at a -3.  ADMITTING IMPRESSION:  Intrauterine pregnancy at 39+ weeks, borderline hypertension.  The patient had a 24-hour urine that showed less than 300 mg per 24 hours.  She is admitted for induction of labor.     [name], M.D.     TFH/MEDQ  D:  03/03/2013  T:  03/03/2013  Job:  [id number]

## 2013-03-04 NOTE — Anesthesia Preprocedure Evaluation (Signed)
Anesthesia Evaluation    Airway Mallampati: III TM Distance: >3 FB Neck ROM: Full    Dental no notable dental hx. (+) Teeth Intact   Pulmonary  breath sounds clear to auscultation  Pulmonary exam normal       Cardiovascular negative cardio ROS  Rhythm:Regular Rate:Normal     Neuro/Psych PSYCHIATRIC DISORDERS Anxiety  Neuromuscular disease    GI/Hepatic Neg liver ROS, hiatal hernia,   Endo/Other  negative endocrine ROS  Renal/GU negative Renal ROS  negative genitourinary   Musculoskeletal   Abdominal   Peds  Hematology negative hematology ROS (+)   Anesthesia Other Findings   Reproductive/Obstetrics (+) Pregnancy                           Anesthesia Physical Anesthesia Plan  ASA: II  Anesthesia Plan: Epidural   Post-op Pain Management:    Induction:   Airway Management Planned: Natural Airway  Additional Equipment:   Intra-op Plan:   Post-operative Plan:   Informed Consent: I have reviewed the patients History and Physical, chart, labs and discussed the procedure including the risks, benefits and alternatives for the proposed anesthesia with the patient or authorized representative who has indicated his/her understanding and acceptance.     Plan Discussed with: Anesthesiologist  Anesthesia Plan Comments:         Anesthesia Quick Evaluation

## 2013-03-04 NOTE — Transfer of Care (Signed)
Immediate Anesthesia Transfer of Care Note  Patient: [name]  Procedure(s) Performed: Procedure(s): CESAREAN SECTION (N/A)  Patient Location: PACU  Anesthesia Type:Epidural  Level of Consciousness: awake  Airway & Oxygen Therapy: Patient Spontanous Breathing  Post-op Assessment: Report given to PACU RN  Post vital signs: Reviewed and stable  Complications: No apparent anesthesia complications

## 2013-03-05 ENCOUNTER — Encounter (HOSPITAL_COMMUNITY): Payer: Self-pay | Admitting: Obstetrics and Gynecology

## 2013-03-05 LAB — CBC
HCT: 31.5 % — ABNORMAL LOW (ref 36.0–46.0)
Hemoglobin: 10.4 g/dL — ABNORMAL LOW (ref 12.0–15.0)
MCH: 26.2 pg (ref 26.0–34.0)
MCHC: 33 g/dL (ref 30.0–36.0)
MCV: 79.3 fL (ref 78.0–100.0)
RDW: 15 % (ref 11.5–15.5)

## 2013-03-05 LAB — CCBB MATERNAL DONOR DRAW

## 2013-03-05 MED ORDER — DIPHENHYDRAMINE HCL 25 MG PO CAPS
25.0000 mg | ORAL_CAPSULE | ORAL | Status: DC | PRN
  Administered 2013-03-05: 25 mg via ORAL
  Filled 2013-03-05: qty 1

## 2013-03-05 MED ORDER — SODIUM CHLORIDE 0.9 % IJ SOLN: 3.0000 mL | INTRAMUSCULAR | Status: DC | PRN

## 2013-03-05 MED ORDER — SIMETHICONE 80 MG PO CHEW: 80.0000 mg | CHEWABLE_TABLET | ORAL | Status: DC | PRN

## 2013-03-05 MED ORDER — SIMETHICONE 80 MG PO CHEW
80.0000 mg | CHEWABLE_TABLET | Freq: Three times a day (TID) | ORAL | Status: DC
  Administered 2013-03-05 – 2013-03-07 (×9): 80 mg via ORAL

## 2013-03-05 MED ORDER — NALBUPHINE HCL 10 MG/ML IJ SOLN
5.0000 mg | INTRAMUSCULAR | Status: DC | PRN
  Filled 2013-03-05: qty 1

## 2013-03-05 MED ORDER — NALOXONE HCL 0.4 MG/ML IJ SOLN: 0.4000 mg | INTRAMUSCULAR | Status: DC | PRN

## 2013-03-05 MED ORDER — DIPHENHYDRAMINE HCL 50 MG/ML IJ SOLN
12.5000 mg | INTRAMUSCULAR | Status: DC | PRN
  Administered 2013-03-05: 12.5 mg via INTRAVENOUS
  Filled 2013-03-05: qty 1

## 2013-03-05 MED ORDER — WITCH HAZEL-GLYCERIN EX PADS: 1.0000 "application " | MEDICATED_PAD | CUTANEOUS | Status: DC | PRN

## 2013-03-05 MED ORDER — CLINDAMYCIN PHOSPHATE 900 MG/50ML IV SOLN
900.0000 mg | Freq: Three times a day (TID) | INTRAVENOUS | Status: AC
  Administered 2013-03-05 (×2): 900 mg via INTRAVENOUS
  Filled 2013-03-05 (×2): qty 50

## 2013-03-05 MED ORDER — KETOROLAC TROMETHAMINE 30 MG/ML IJ SOLN: 30.0000 mg | Freq: Four times a day (QID) | INTRAMUSCULAR | Status: AC | PRN

## 2013-03-05 MED ORDER — LANOLIN HYDROUS EX OINT: 1.0000 "application " | TOPICAL_OINTMENT | CUTANEOUS | Status: DC | PRN

## 2013-03-05 MED ORDER — ZOLPIDEM TARTRATE 5 MG PO TABS: 5.0000 mg | ORAL_TABLET | Freq: Every evening | ORAL | Status: DC | PRN

## 2013-03-05 MED ORDER — MEASLES, MUMPS & RUBELLA VAC ~~LOC~~ INJ
0.5000 mL | INJECTION | Freq: Once | SUBCUTANEOUS | Status: DC
  Filled 2013-03-05: qty 0.5

## 2013-03-05 MED ORDER — MENTHOL 3 MG MT LOZG: 1.0000 | LOZENGE | OROMUCOSAL | Status: DC | PRN

## 2013-03-05 MED ORDER — IBUPROFEN 600 MG PO TABS
600.0000 mg | ORAL_TABLET | Freq: Four times a day (QID) | ORAL | Status: DC
  Administered 2013-03-05 – 2013-03-07 (×9): 600 mg via ORAL
  Filled 2013-03-05 (×9): qty 1

## 2013-03-05 MED ORDER — ONDANSETRON HCL 4 MG/2ML IJ SOLN: 4.0000 mg | Freq: Three times a day (TID) | INTRAMUSCULAR | Status: DC | PRN

## 2013-03-05 MED ORDER — DIPHENHYDRAMINE HCL 25 MG PO CAPS: 25.0000 mg | ORAL_CAPSULE | Freq: Four times a day (QID) | ORAL | Status: DC | PRN

## 2013-03-05 MED ORDER — OXYTOCIN 40 UNITS IN LACTATED RINGERS INFUSION - SIMPLE MED: 62.5000 mL/h | INTRAVENOUS | Status: AC

## 2013-03-05 MED ORDER — NALOXONE HCL 1 MG/ML IJ SOLN
1.0000 ug/kg/h | INTRAVENOUS | Status: DC | PRN
  Filled 2013-03-05: qty 2

## 2013-03-05 MED ORDER — METOCLOPRAMIDE HCL 5 MG/ML IJ SOLN: 10.0000 mg | Freq: Three times a day (TID) | INTRAMUSCULAR | Status: DC | PRN

## 2013-03-05 MED ORDER — DIBUCAINE 1 % RE OINT: 1.0000 "application " | TOPICAL_OINTMENT | RECTAL | Status: DC | PRN

## 2013-03-05 MED ORDER — NALBUPHINE HCL 10 MG/ML IJ SOLN
5.0000 mg | INTRAMUSCULAR | Status: DC | PRN
  Administered 2013-03-05 (×2): 10 mg via INTRAVENOUS
  Filled 2013-03-05 (×3): qty 1

## 2013-03-05 MED ORDER — DIPHENHYDRAMINE HCL 50 MG/ML IJ SOLN: 25.0000 mg | INTRAMUSCULAR | Status: DC | PRN

## 2013-03-05 MED ORDER — LORATADINE 10 MG PO TABS
10.0000 mg | ORAL_TABLET | Freq: Every day | ORAL | Status: AC
  Administered 2013-03-05 – 2013-03-07 (×3): 10 mg via ORAL
  Filled 2013-03-05 (×4): qty 1

## 2013-03-05 MED ORDER — ONDANSETRON HCL 4 MG/2ML IJ SOLN: 4.0000 mg | INTRAMUSCULAR | Status: DC | PRN

## 2013-03-05 MED ORDER — OXYCODONE-ACETAMINOPHEN 5-325 MG PO TABS
1.0000 | ORAL_TABLET | ORAL | Status: DC | PRN
  Administered 2013-03-05 – 2013-03-07 (×5): 1 via ORAL
  Filled 2013-03-05 (×5): qty 1

## 2013-03-05 MED ORDER — SENNOSIDES-DOCUSATE SODIUM 8.6-50 MG PO TABS
2.0000 | ORAL_TABLET | Freq: Every day | ORAL | Status: DC
  Administered 2013-03-05 – 2013-03-06 (×3): 2 via ORAL

## 2013-03-05 MED ORDER — PRENATAL MULTIVITAMIN CH
1.0000 | ORAL_TABLET | Freq: Every day | ORAL | Status: AC
  Administered 2013-03-05 – 2013-03-07 (×3): 1 via ORAL
  Filled 2013-03-05 (×3): qty 1

## 2013-03-05 MED ORDER — ONDANSETRON HCL 4 MG PO TABS: 4.0000 mg | ORAL_TABLET | ORAL | Status: DC | PRN

## 2013-03-05 MED ORDER — PANTOPRAZOLE SODIUM 20 MG PO TBEC
20.0000 mg | DELAYED_RELEASE_TABLET | Freq: Every day | ORAL | Status: AC
  Administered 2013-03-05 – 2013-03-07 (×3): 20 mg via ORAL
  Filled 2013-03-05 (×4): qty 1

## 2013-03-05 MED ORDER — TETANUS-DIPHTH-ACELL PERTUSSIS 5-2.5-18.5 LF-MCG/0.5 IM SUSP
0.5000 mL | Freq: Once | INTRAMUSCULAR | Status: DC
  Filled 2013-03-05: qty 0.5

## 2013-03-05 MED ORDER — LACTATED RINGERS IV SOLN
INTRAVENOUS | Status: AC
  Administered 2013-03-05: 06:00:00 via INTRAVENOUS

## 2013-03-05 MED ORDER — SERTRALINE HCL 50 MG PO TABS
50.0000 mg | ORAL_TABLET | Freq: Every day | ORAL | Status: AC
  Administered 2013-03-05 – 2013-03-07 (×3): 50 mg via ORAL
  Filled 2013-03-05 (×4): qty 1

## 2013-03-05 NOTE — Anesthesia Postprocedure Evaluation (Signed)
  Anesthesia Post-op Note  Patient: [name]  Procedure(s) Performed: Procedure(s): CESAREAN SECTION (N/A)  Patient Location: PACU and Mother/Baby  Anesthesia Type:Epidural  Level of Consciousness: awake  Airway and Oxygen Therapy: Patient Spontanous Breathing  Post-op Pain: none  Post-op Assessment: Patient's Cardiovascular Status Stable, Respiratory Function Stable, Patent Airway, No signs of Nausea or vomiting, Adequate PO intake, Pain level controlled, No headache, No backache, No residual numbness and No residual motor weakness  Post-op Vital Signs: Reviewed and stable  Complications: No apparent anesthesia complications

## 2013-03-05 NOTE — Progress Notes (Signed)

## 2013-03-05 NOTE — Anesthesia Postprocedure Evaluation (Signed)
  Anesthesia Post-op Note  Anesthesia Post Note  Patient: [name]  Procedure(s) Performed: Procedure(s) (LRB): CESAREAN SECTION (N/A)  Anesthesia type: Epidural  Patient location: PACU  Post pain: Pain level controlled  Post assessment: Post-op Vital signs reviewed  Last Vitals:  Filed Vitals:   03/05/13 0018  BP: 127/81  Pulse: 87  Temp: 37.8 C  Resp: 16    Post vital signs: stable  Level of consciousness: awake  Complications: No apparent anesthesia complications

## 2013-03-05 NOTE — Anesthesia Postprocedure Evaluation (Signed)
  Anesthesia Post-op Note  Patient went for C/S and epidural was used for C/S.  Charts were accidentally not linked.  See C/S chart for further details.

## 2013-03-05 NOTE — Progress Notes (Signed)
Patient ID: [name], female   DOB: 01-Jun-1986, 27 y.o.   MRN: [id number] #1 AFEBRILE bp NORMAL NO COMPLAINTS hgb STABLE

## 2013-03-06 NOTE — Progress Notes (Signed)
Patient ID: [name], female   DOB: 02/10/1986, 27 y.o.   MRN: [id number] #2 afebrile BP normal Tolerating a regular diet, passing flatus, ambulating well and voiding well.

## 2013-03-07 MED ORDER — PANTOPRAZOLE SODIUM 20 MG PO TBEC: 20.0000 mg | DELAYED_RELEASE_TABLET | Freq: Every day | ORAL | Status: DC

## 2013-03-07 MED ORDER — OXYCODONE-ACETAMINOPHEN 5-325 MG PO TABS: 1.0000 | ORAL_TABLET | Freq: Four times a day (QID) | ORAL | Status: DC | PRN

## 2013-03-07 MED ORDER — IBUPROFEN 600 MG PO TABS: 600.0000 mg | ORAL_TABLET | Freq: Four times a day (QID) | ORAL | Status: DC | PRN

## 2013-03-07 NOTE — Progress Notes (Signed)
Patient ID: [name], female   DOB: [birth year], 27 y.o.   MRN: [id number] #3 afebrile doing well for d/c

## 2013-03-07 NOTE — Discharge Summary (Signed)
[name], [name]               ACCOUNT NO.:  [id number]  MEDICAL RECORD NO.:  [id number]  LOCATION:  [id number]                          FACILITY:  [facility]  PHYSICIAN:  Malachi Pro. Ambrose Mantle, M.D. DATE OF BIRTH:  06/02/1986  DATE OF ADMISSION:  03/04/2013 DATE OF DISCHARGE:  03/07/2013                              DISCHARGE SUMMARY   HISTORY OF PRESENT ILLNESS:  A 27 year old white female, para 0, gravida 1, EDC March 06, 2013, admitted for induction of labor.  O positive, negative antibody, RPR nonreactive, hepatitis B surface antigen negative, rubella positive, HIV negative, GC and Chlamydia negative. Urine culture negative.  One hour Glucola 131.  The patient had a leukocytosis during pregnancy.  We will follow the postpartum checkup. If she remains, I will have her see the hematologist.  Group B strep was positive.  She requested multiple bile acid test because she was itching, they were all negative.  The patient was admitted for induction of labor.  She progressed to 10 cm dilatation, always had bradycardia when she went on her back, so she was maintained in lateral position throughout pregnancy and throughout labor got to full dilatation, began having severe decelerations, associated with the severe decelerations was marked variability, so I proceeded to do an urgent C-section.  It was done under epidural anesthesia being female infant delivered 7 pounds 11 ounces.  Apgars of 7 and 9 at 1 and 5 minutes, lot of thick meconium- stained fluid was present.  Postoperatively, the patient did quite well and was discharged on the third postop day passing flatus, tolerating a diet, voiding well, ambulating well with minimal pain.  FINAL DIAGNOSES:  Intrauterine pregnancy at 39+ weeks, delivered vertex by C-section, fetal stress associated with marked decelerations of the fetal heart rate and marked variability.  OPERATION:  Low-transverse cervical C-section.  FINAL CONDITION:   Improved.  INSTRUCTIONS:  Our regular discharge instruction booklet.  The patient is advised to return to the office in about 5 days for followup examination.  LABORATORY DATA:  On admission, white count was only 13,300, hemoglobin 10.7, hematocrit 33.6, platelet count 225,000.  RPR nonreactive. Followup hemoglobin 10.4.  The patient will be given prescriptions for Motrin 600 mg 30 tablets 1 every 6 hours as needed for pain, Percocet 5/325, 30 tablets, 1 every 6 hours as needed for pain, Protonix 20 mg a day, Zyrtec as needed and Zoloft 50 mg daily.  She is to return to the office in about 4-5 days to have her staples removed and bandage removed.    Malachi Pro. Ambrose Mantle, M.D.    TFH/MEDQ  D:  03/07/2013  T:  03/07/2013  Job:  [id number]

## 2013-05-07 ENCOUNTER — Ambulatory Visit (HOSPITAL_COMMUNITY)
Admission: RE | Admit: 2013-05-07 | Discharge: 2013-05-07 | Disposition: A | Discharge status: Home / Self Care | Payer: Commercial Indemnity | Attending: Psychiatry | Admitting: Psychiatry

## 2013-05-07 DIAGNOSIS — F411 Generalized anxiety disorder: Secondary | ICD-10-CM | POA: Insufficient documentation
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
## 2013-07-22 ENCOUNTER — Other Ambulatory Visit: Payer: Self-pay

## 2013-07-22 ENCOUNTER — Encounter: Payer: Self-pay | Admitting: Family Medicine

## 2013-09-07 ENCOUNTER — Encounter (HOSPITAL_COMMUNITY): Payer: Self-pay | Admitting: Emergency Medicine

## 2013-09-07 ENCOUNTER — Emergency Department (INDEPENDENT_AMBULATORY_CARE_PROVIDER_SITE_OTHER)
Admission: EM | Admit: 2013-09-07 | Discharge: 2013-09-07 | Disposition: A | Discharge status: Home / Self Care | Payer: Managed Care, Other (non HMO) | Source: Home / Self Care | Attending: Family Medicine | Admitting: Family Medicine

## 2013-09-07 DIAGNOSIS — H9209 Otalgia, unspecified ear: Secondary | ICD-10-CM

## 2013-09-07 DIAGNOSIS — H6981 Other specified disorders of Eustachian tube, right ear: Secondary | ICD-10-CM

## 2013-09-07 DIAGNOSIS — H6120 Impacted cerumen, unspecified ear: Secondary | ICD-10-CM

## 2013-09-07 DIAGNOSIS — H6980 Other specified disorders of Eustachian tube, unspecified ear: Secondary | ICD-10-CM

## 2013-09-07 DIAGNOSIS — H9203 Otalgia, bilateral: Secondary | ICD-10-CM

## 2013-09-07 DIAGNOSIS — H6123 Impacted cerumen, bilateral: Secondary | ICD-10-CM

## 2013-09-07 IMAGING — US US ABDOMEN COMPLETE
1 series · 13 of 25 positions shown · non-contrast
Comparison: None.

CLINICAL DATA: Side and back pain related to meals.  22 weeks EGA.

COMPLETE ABDOMINAL ULTRASOUND

[Series 1: us abdomen complete · 13 of 71 slices shown]
[im 1/71]
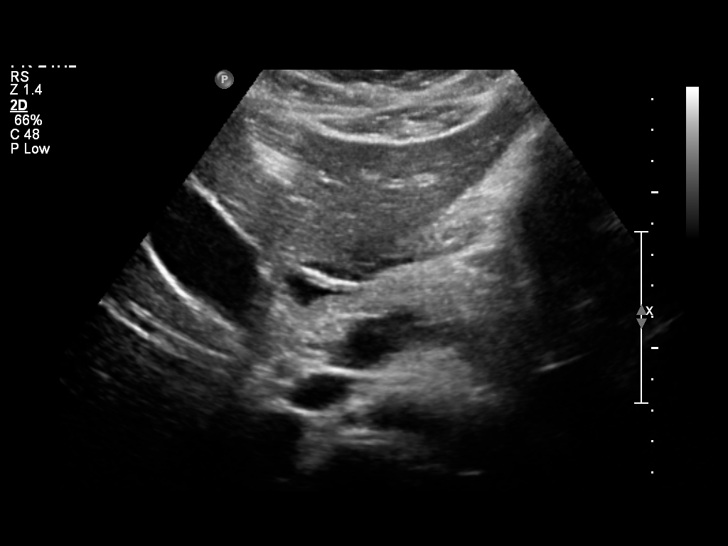
[im 6/71]
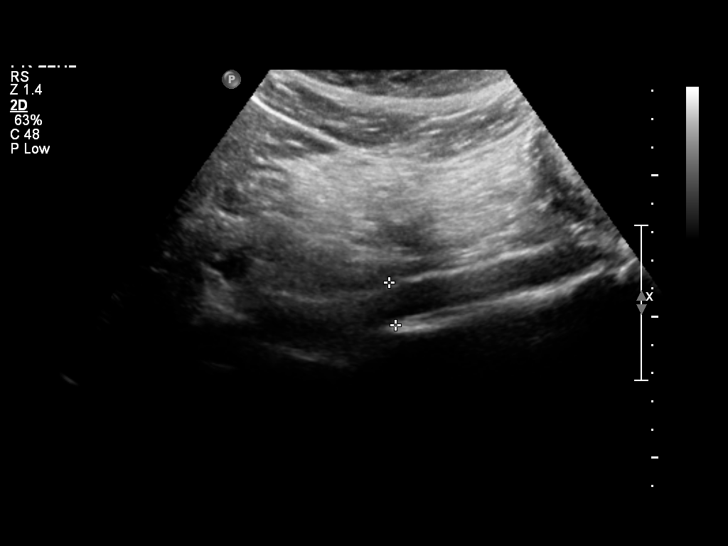
[im 12/71]
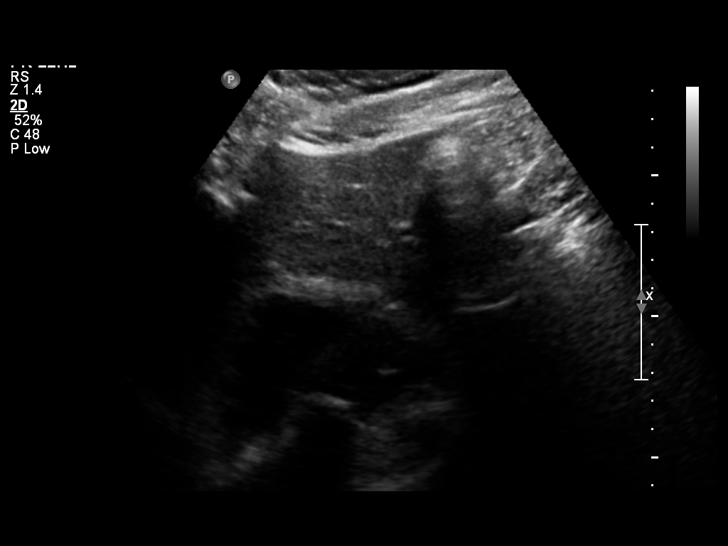
[im 18/71]
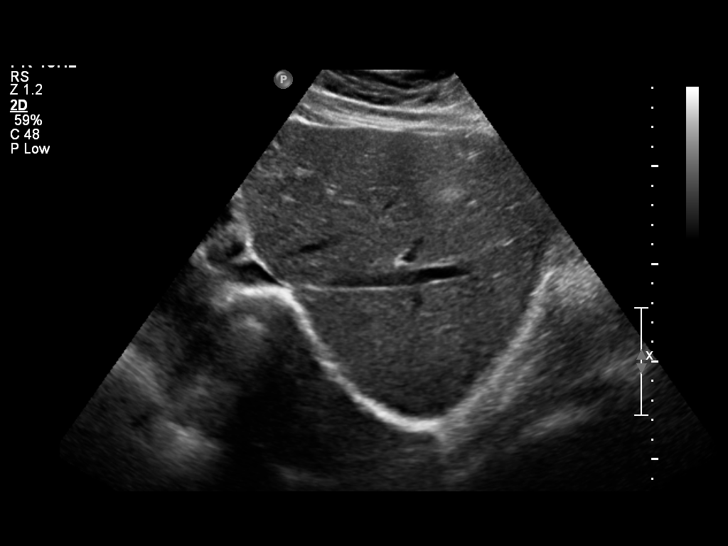
[im 24/71]
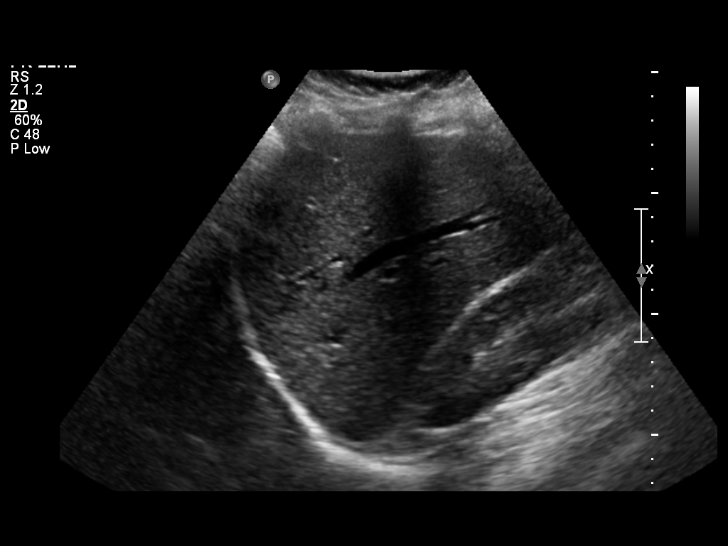
[im 30/71]
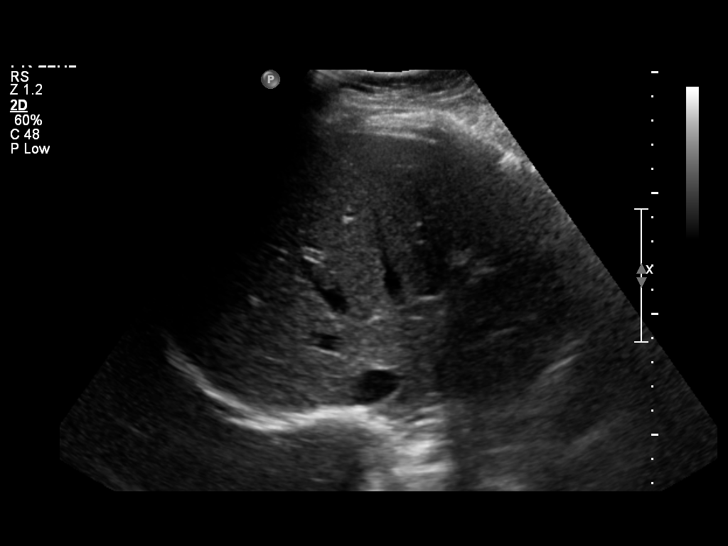
[im 36/71]
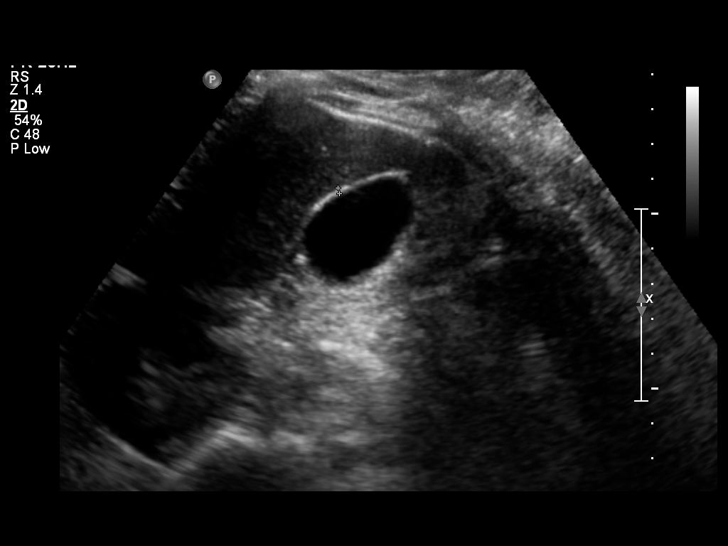
[im 41/71]
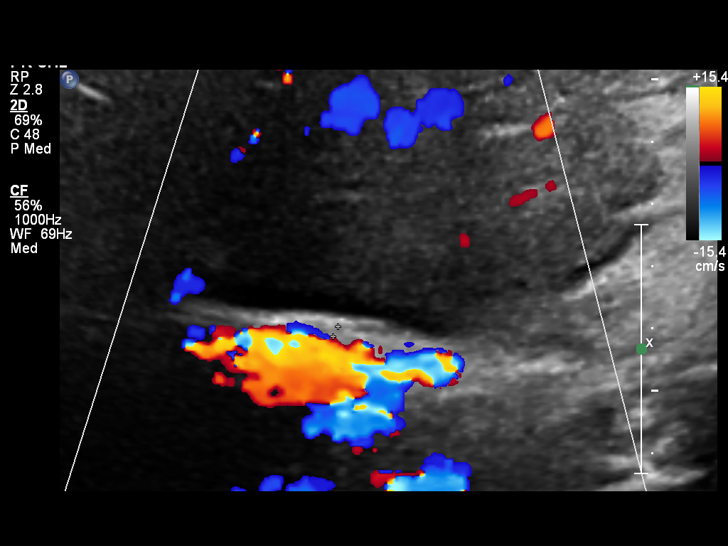
[im 47/71]
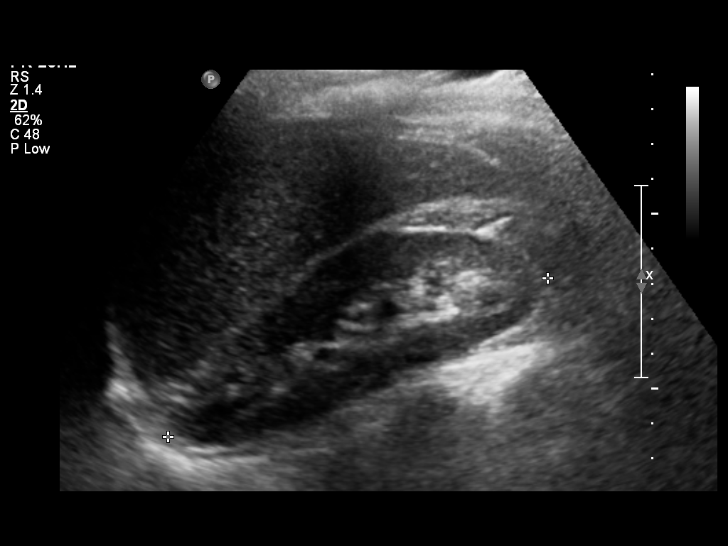
[im 53/71]
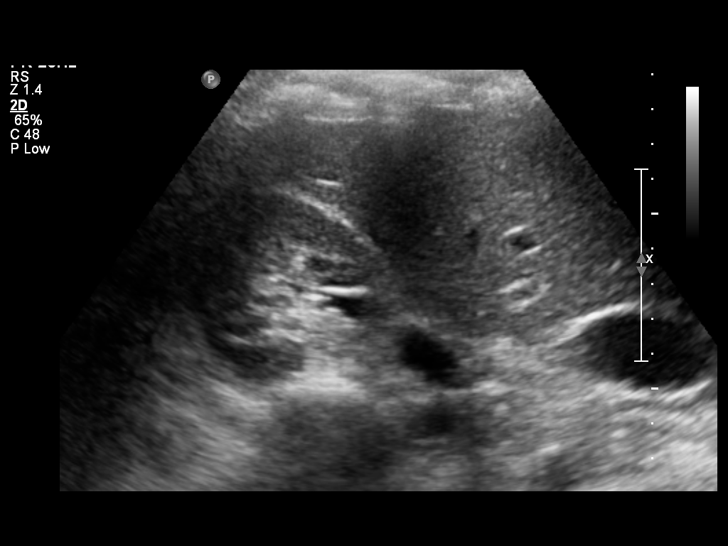
[im 59/71]
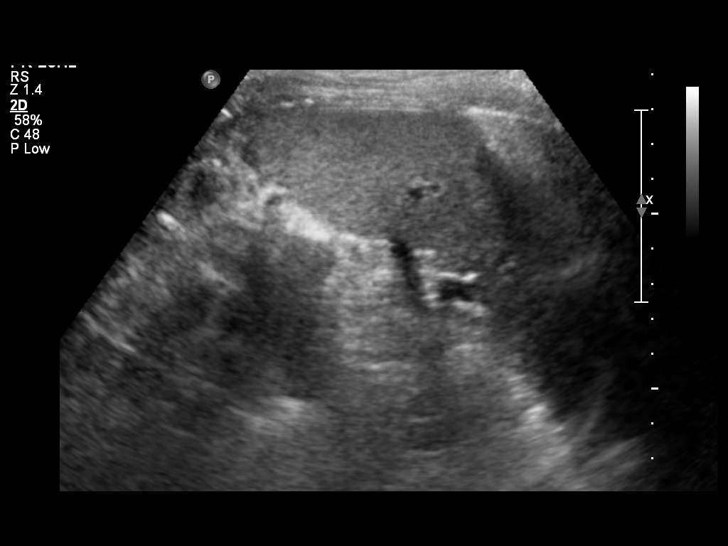
[im 65/71]
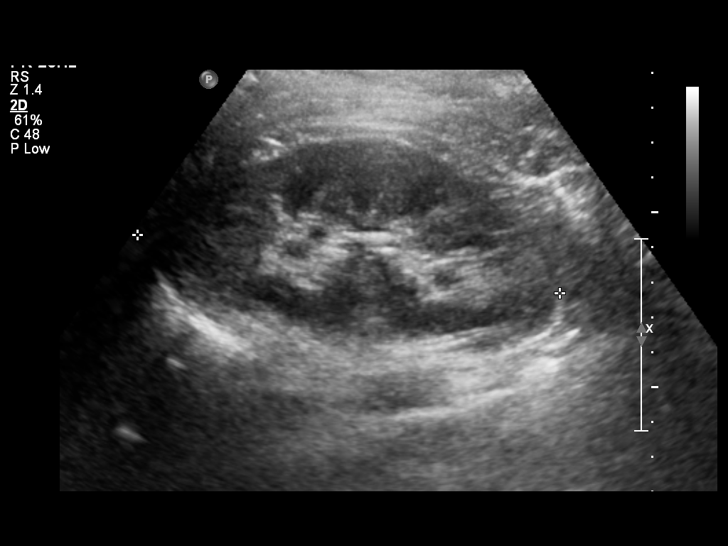
[im 71/71]
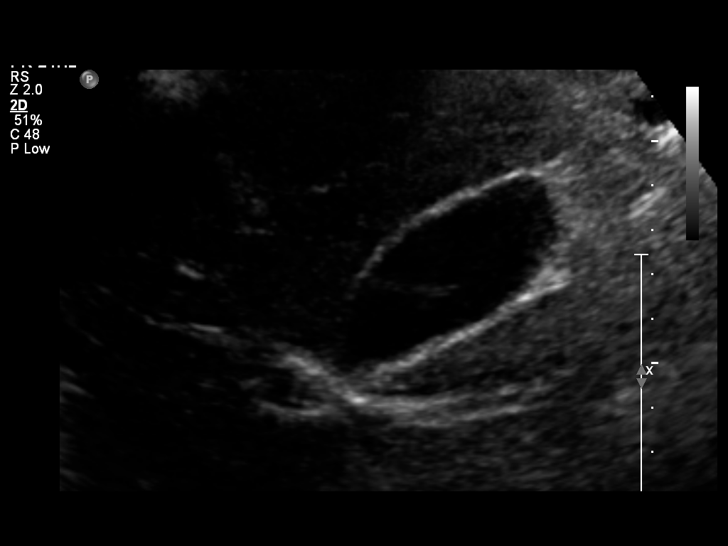

[13 of 25 positions shown; findings below may reference images not displayed]

FINDINGS: Gallbladder:  Is well distended and shows no evidence for
intraluminal stones or sludge.  No pericholecystic fluid or
gallbladder wall thickening is noted.  Evaluation for a sonographic
Murphy's sign is negative

Common bile duct:  Only a small portion of the common bile duct is
visualized and has a normal caliber of 2 mm

Liver:  Appears homogeneous in echotexture with no focal
parenchymal abnormality or signs of intrahepatic ductal dilatation

IVC:  The proximal portion appears normal

Pancreas:  The head and body have a normal appearance.  The tail is
obscured by overlying gas and incompletely assessed

Spleen:  Has a sagittal length of 8.9 cm and a homogeneous
parenchymal pattern

Right Kidney:  Has a sagittal length of 11.7 cm.  No focal
parenchymal abnormality or signs of hydronephrosis are seen

Left Kidney:  Has a sagittal length of 11.9 cm.  No focal
parenchymal abnormalities or signs of hydronephrosis are evident

Abdominal aorta:  Has a maximal caliber of 19 mm with no aneurysmal
dilatation seen.
IMPRESSION: Normal abdominal ultrasound

## 2013-09-07 MED ORDER — FLUTICASONE PROPIONATE 50 MCG/ACT NA SUSP: 2.0000 | Freq: Two times a day (BID) | NASAL | Status: DC

## 2013-09-07 NOTE — ED Provider Notes (Signed)
CSN: [id number]     Arrival date & time 09/07/13  1731 History   First MD Initiated Contact with Patient 09/07/13 1843     Chief Complaint  Patient presents with  . Otalgia  . Sore Throat   (Consider location/radiation/quality/duration/timing/severity/associated sxs/prior Treatment) HPI Comments: 27 year old female presents complaining of right ear pain that radiates down to her neck, now starting to have some slight fullness and pressure in her left ear. A few days ago, she also had a sore throat but this has mostly resolved. She has multiple contacts at home with viral upper respiratory infections. She's not had any fever, chills, NVD, or any other systemic symptoms.  Patient is a 27 y.o. female presenting with ear pain and pharyngitis.  Otalgia Associated symptoms: sore throat   Associated symptoms: no abdominal pain, no cough, no fever, no rash and no vomiting   Sore Throat Pertinent negatives include no chest pain, no abdominal pain and no shortness of breath.    Past Medical History  Diagnosis Date  . Hiatal hernia   . Scoliosis   . Palpitations   . Trouble in sleeping   . Anxiety   . Mitral valve disorder in pregnancy    Past Surgical History  Procedure Laterality Date  . No past surgeries    . Cesarean section N/A 03/04/2013    Procedure: CESAREAN SECTION;  Surgeon: Bing Plume, MD;  Location: [facility];  Service: Obstetrics;  Laterality: N/A;   Family History  Problem Relation Age of Onset  . Diabetes Mother   . Hypertension Mother   . Kidney disease Mother   . Hypertension Father    History  Substance Use Topics  . Smoking status: Never Smoker   . Smokeless tobacco: Never Used  . Alcohol Use: No   OB History   Grav Para Term Preterm Abortions TAB SAB Ect Mult Living   1 1 1  0 0 0 0 0 0 1     Review of Systems  Constitutional: Negative for fever and chills.  HENT: Positive for ear pain and sore throat.   Eyes: Negative for visual disturbance.   Respiratory: Negative for cough and shortness of breath.   Cardiovascular: Negative for chest pain, palpitations and leg swelling.  Gastrointestinal: Negative for nausea, vomiting and abdominal pain.  Endocrine: Negative for polydipsia and polyuria.  Genitourinary: Negative for dysuria, urgency and frequency.  Musculoskeletal: Negative for arthralgias and myalgias.  Skin: Negative for rash.  Neurological: Negative for dizziness, weakness and light-headedness.    Allergies  Amoxicillin  Home Medications   Current Outpatient Rx  Name  Route  Sig  Dispense  Refill  . busPIRone (BUSPAR) 15 MG tablet   Oral   Take 15 mg by mouth 2 (two) times daily.         . cetirizine (ZYRTEC) 10 MG tablet   Oral   Take 10 mg by mouth daily.         Marland Kitchen ibuprofen (ADVIL,MOTRIN) 600 MG tablet   Oral   Take 1 tablet (600 mg total) by mouth every 6 (six) hours as needed for pain.   30 tablet   0   . pantoprazole (PROTONIX) 20 MG tablet   Oral   Take 1 tablet (20 mg total) by mouth daily.   30 tablet   2   . Prenatal Vit-Fe Fumarate-FA (PRENATAL MULTIVITAMIN) TABS   Oral   Take 1 tablet by mouth daily.         Marland Kitchen  sertraline (ZOLOFT) 50 MG tablet   Oral   Take 50 mg by mouth daily.         . fluticasone (FLONASE) 50 MCG/ACT nasal spray   Each Nare   Place 2 sprays into both nostrils 2 (two) times daily.   1 g   2   . oxyCODONE-acetaminophen (PERCOCET/ROXICET) 5-325 MG per tablet   Oral   Take 1 tablet by mouth every 6 (six) hours as needed.   30 tablet   0    BP 143/87  Pulse 87  Temp(Src) 98.5 F (36.9 C) (Oral)  Resp 18  SpO2 98%  LMP 08/27/2013  Breastfeeding? Yes Physical Exam  Nursing note and vitals reviewed. Constitutional: She is oriented to person, place, and time. Vital signs are normal. She appears well-developed and well-nourished. No distress.  HENT:  Head: Normocephalic and atraumatic.  Right Ear: There is drainage.  Left Ear: There is drainage.   Nose: Nose normal. Right sinus exhibits no maxillary sinus tenderness and no frontal sinus tenderness. Left sinus exhibits no maxillary sinus tenderness and no frontal sinus tenderness.  Mouth/Throat: Uvula is midline, oropharynx is clear and moist and mucous membranes are normal.  Bilateral cerumen impaction  Neck: Normal range of motion. Neck supple.  Cardiovascular: Normal rate, regular rhythm and normal heart sounds.  Exam reveals no gallop and no friction rub.   No murmur heard. Pulmonary/Chest: Effort normal and breath sounds normal. No respiratory distress. She has no wheezes. She has no rales.  Lymphadenopathy:    She has no cervical adenopathy.  Neurological: She is alert and oriented to person, place, and time. She has normal strength. Coordination normal.  Skin: Skin is warm and dry. No rash noted. She is not diaphoretic.  Psychiatric: She has a normal mood and affect. Judgment normal.    ED Course  Procedures (including critical care time) Labs Review Labs Reviewed - No data to display Imaging Review No results found.  EKG Interpretation    Date/Time:    Ventricular Rate:    PR Interval:    QRS Duration:   QT Interval:    QTC Calculation:   R Axis:     Text Interpretation:              MDM   1. Otalgia of both ears   2. Eustachian tube dysfunction, right   3. Cerumen impaction, bilateral    Cerumen rinsed out, TMs look perfect, no signs of infection. Eustachian tube dysfunction, treat with Flonase and Advil allergy sinus. Followup when necessary    Graylon Good, PA-C 09/07/13 1921

## 2013-09-07 NOTE — ED Notes (Signed)
27 yr old is here with complaints of ear pain - Right; sore throat for three days. Denies: SOB' Chest Pains

## 2013-09-08 NOTE — ED Provider Notes (Signed)
Medical screening examination/treatment/procedure(s) were performed by a resident physician or non-physician practitioner and as the supervising physician I was immediately available for consultation/collaboration.  Cove Haydon, MD    Azharia Surratt S Ayriana Wix, MD 09/08/13 0738 

## 2013-12-12 ENCOUNTER — Ambulatory Visit: Payer: Self-pay | Admitting: Family Medicine

## 2014-07-20 ENCOUNTER — Encounter (HOSPITAL_COMMUNITY): Payer: Self-pay | Admitting: Emergency Medicine

## 2017-09-20 ENCOUNTER — Ambulatory Visit (INDEPENDENT_AMBULATORY_CARE_PROVIDER_SITE_OTHER): Payer: Managed Care, Other (non HMO) | Admitting: Obstetrics and Gynecology

## 2017-09-20 ENCOUNTER — Encounter: Payer: Self-pay | Admitting: Obstetrics and Gynecology

## 2017-09-20 VITALS — BP 130/90 | HR 113 | Ht 63.0 in | Wt 172.0 lb

## 2017-09-20 DIAGNOSIS — N898 Other specified noninflammatory disorders of vagina: Secondary | ICD-10-CM

## 2017-09-20 DIAGNOSIS — N949 Unspecified condition associated with female genital organs and menstrual cycle: Secondary | ICD-10-CM | POA: Diagnosis not present

## 2017-09-20 LAB — POCT WET PREP WITH KOH
CLUE CELLS WET PREP PER HPF POC: NEGATIVE
KOH Prep POC: NEGATIVE
Trichomonas, UA: NEGATIVE
YEAST WET PREP PER HPF POC: NEGATIVE

## 2017-09-20 NOTE — Progress Notes (Signed)
Chief Complaint  Patient presents with  . Vaginitis    HPI:      [name] is a 32 y.o. G2P1001 who LMP was Patient's last menstrual period was 08/13/2017., presents today for NP eval of increased vag d/c (clear to white, thin) with mild burning vaginally. No itch, odor. Hx of BV in the past. No recent abx use. No meds to treat. No LBP, vaginal bleeding, fevers, pelvic pain, urin sx. She is [redacted]w[redacted]d pregnant and has NOB appt 09/26/17.    Past Medical History:  Diagnosis Date  . Anxiety   . Hiatal hernia   . 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 Palpitations   . Scoliosis   . Trouble in sleeping     Past Surgical History:  Procedure Laterality Date  . CESAREAN SECTION N/A 03/04/2013   Procedure: CESAREAN SECTION;  Surgeon: Bing Plume, MD;  Location: [facility];  Service: Obstetrics;  Laterality: N/A;  . NO PAST SURGERIES      Family History  Problem Relation Age of Onset  . Diabetes Mother   . Hypertension Mother   . Kidney disease Mother   . Hypertension Father     Social History   Socioeconomic History  . Marital status: Married    Spouse name: Not on file  . Number of children: Not on file  . Years of education: Not on file  . Highest education level: Not on file  Social Needs  . Financial resource strain: Not on file  . Food insecurity - worry: Not on file  . Food insecurity - inability: Not on file  . Transportation needs - medical: Not on file  . Transportation needs - non-medical: Not on file  Occupational History  . Not on file  Tobacco Use  . Smoking status: Never Smoker  . Smokeless tobacco: Never Used  Substance and Sexual Activity  . Alcohol use: No  . Drug use: No  . Sexual activity: Yes    Birth control/protection: None  Other Topics Concern  . Not on file  Social History Narrative  . Not on file     Current Outpatient Medications:  .  cetirizine (ZYRTEC) 10 MG tablet, Take 10 mg by mouth daily., Disp: , Rfl:  .  pantoprazole  (PROTONIX) 20 MG tablet, Take 1 tablet (20 mg total) by mouth daily., Disp: 30 tablet, Rfl: 2 .  Prenatal Vit-Fe Fumarate-FA (PRENATAL MULTIVITAMIN) TABS, Take 1 tablet by mouth daily., Disp: , Rfl:  .  sertraline (ZOLOFT) 50 MG tablet, Take 50 mg by mouth daily., Disp: , Rfl:  .  busPIRone (BUSPAR) 15 MG tablet, Take 15 mg by mouth 2 (two) times daily., Disp: , Rfl:  .  fluticasone (FLONASE) 50 MCG/ACT nasal spray, Place 2 sprays into both nostrils 2 (two) times daily. (Patient not taking: Reported on 09/20/2017), Disp: 1 g, Rfl: 2 .  ibuprofen (ADVIL,MOTRIN) 600 MG tablet, Take 1 tablet (600 mg total) by mouth every 6 (six) hours as needed for pain. (Patient not taking: Reported on 09/20/2017), Disp: 30 tablet, Rfl: 0 .  oxyCODONE-acetaminophen (PERCOCET/ROXICET) 5-325 MG per tablet, Take 1 tablet by mouth every 6 (six) hours as needed. (Patient not taking: Reported on 09/20/2017), Disp: 30 tablet, Rfl: 0   ROS:  Review of Systems  Constitutional: Negative for fever.  Gastrointestinal: Negative for blood in stool, constipation, diarrhea, nausea and vomiting.  Genitourinary: Positive for vaginal discharge. Negative for dyspareunia, dysuria, flank pain, frequency, hematuria, urgency, vaginal bleeding  and vaginal pain.  Musculoskeletal: Negative for back pain.  Skin: Negative for rash.     OBJECTIVE:   Vitals:  BP 130/90   Pulse (!) 113   Ht 5\' 3"  (1.6 m)   Wt 172 lb (78 kg)   LMP 08/13/2017   BMI 30.47 kg/m   Physical Exam  Constitutional: She is oriented to person, place, and time and well-developed, well-nourished, and in no distress. Vital signs are normal.  Genitourinary: Vagina normal, uterus normal, cervix normal, right adnexa normal, left adnexa normal and vulva normal. Uterus is not enlarged. Cervix exhibits no motion tenderness and no tenderness. Right adnexum displays no mass and no tenderness. Left adnexum displays no mass and no tenderness. Vulva exhibits no erythema, no  exudate, no lesion, no rash and no tenderness. Vagina exhibits no lesion.  Neurological: She is oriented to person, place, and time.  Vitals reviewed.   Results: Results for orders placed or performed in visit on 09/20/17 (from the past 24 hour(s))  POCT Wet Prep with KOH     Status: Normal   Collection Time: 09/20/17 10:39 AM  Result Value Ref Range   Trichomonas, UA Negative    Clue Cells Wet Prep HPF POC neg    Epithelial Wet Prep HPF POC  Few, Moderate, Many, Too numerous to count   Yeast Wet Prep HPF POC neg    Bacteria Wet Prep HPF POC  Few   RBC Wet Prep HPF POC     WBC Wet Prep HPF POC     KOH Prep POC Negative Negative     Assessment/Plan: Vaginal discharge - Neg wet prep/neg exam. Question related to pregnancy. F/u prn sx/as NOB appt prn. - Plan: POCT Wet Prep with KOH  Vaginal burning - Neg exam. Question due to vaginal moisture. Try OTC hydrocortisone crm ext prn sx. F/u at NOB.    Return if symptoms worsen or fail to improve.  Alicia B. Copland, PA-C 09/20/2017 10:40 AM

## 2017-09-20 NOTE — Patient Instructions (Signed)
I value your feedback and entrusting us with your care. If you get a [facility] patient survey, I would appreciate you taking the time to let us know about your experience today. Thank you! 

## 2017-09-25 ENCOUNTER — Ambulatory Visit (INDEPENDENT_AMBULATORY_CARE_PROVIDER_SITE_OTHER): Payer: Managed Care, Other (non HMO) | Admitting: Maternal Newborn

## 2017-09-25 ENCOUNTER — Encounter: Payer: Self-pay | Admitting: Maternal Newborn

## 2017-09-25 VITALS — BP 140/80 | Wt 173.0 lb

## 2017-09-25 DIAGNOSIS — Z8759 Personal history of other complications of pregnancy, childbirth and the puerperium: Secondary | ICD-10-CM

## 2017-09-25 DIAGNOSIS — O0990 Supervision of high risk pregnancy, unspecified, unspecified trimester: Secondary | ICD-10-CM | POA: Insufficient documentation

## 2017-09-25 DIAGNOSIS — Z3481 Encounter for supervision of other normal pregnancy, first trimester: Secondary | ICD-10-CM

## 2017-09-25 DIAGNOSIS — O34219 Maternal care for unspecified type scar from previous cesarean delivery: Secondary | ICD-10-CM

## 2017-09-25 NOTE — Progress Notes (Signed)
09/25/2017   Chief Complaint: Amenorrhea, positive home pregnancy test, desires prenatal care.  Transfer of Care Patient: no  History of Present Illness: [name] is a 32 y.o. G3P1011 at 5679w1d based on Patient's last menstrual period on 08/13/2017, with an Estimated Date of Delivery: 05/20/18, with the above CC.   Her periods were: regular periods every 28 days She was using no method when she conceived.  She has Positive signs or symptoms of nausea/vomiting of pregnancy. Zofran worked for her during her last pregnancy. She has Negative signs or symptoms of miscarriage or preterm labor; she has some brown spotting and cramping that she is anxious about but feels that it may be lessening. She identifies Negative Zika risk factors for her and her partner On any different medications around the time she conceived/early pregnancy: Yes, was taking Buspar but has recently discontinued. Currently taking Zoloft, Protonix, Zyrtec and Tylenol History of varicella: Yes   ROS: A 12-point review of systems was performed and negative, except as stated in the above HPI.  OBGYN History: As per HPI. OB History  Gravida Para Term Preterm AB Living  3 1 1  0 1 1  SAB TAB Ectopic Multiple Live Births  1 0 0 0 1    # Outcome Date GA Lbr Len/2nd Weight Sex Delivery Anes PTL Lv  3 Current           2 SAB 07/2017             Birth Comments: chemical preg  1 Term 03/[birth year]13 5439w4d 09:18 / 00:56 7 lb 10.9 oz (3.484 kg) M CS-LTranv EPI  LIV      Any issues with any prior pregnancies: yes, G2 was a very early miscarriage Any prior children are healthy, doing well, without any problems or issues: yes History of pap smears: Yes. Last pap smear 2017, normal per patient and she declines Pap today.  History of STIs: No   Past Medical History: Past Medical History:  Diagnosis Date  . Anxiety   . Hiatal hernia   . 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 Palpitations   . Scoliosis   . Trouble in sleeping      Past Surgical History: Past Surgical History:  Procedure Laterality Date  . CESAREAN SECTION N/A 03/04/2013   Procedure: CESAREAN SECTION;  Surgeon: Bing Plumehomas F Henley, MD;  Location: [facility];  Service: Obstetrics;  Laterality: N/A;  . NO PAST SURGERIES      Family History:  Family History  Problem Relation Age of Onset  . Diabetes Mother   . Hypertension Mother   . Kidney disease Mother   . Hypertension Father    She denies any female cancers, bleeding or blood clotting disorders.  She denies any history of intellectual disability, birth defects or genetic disorders in her or the FOB's history  Social History:  Social History   Socioeconomic History  . Marital status: Married    Spouse name: Not on file  . Number of children: 1  . Years of education: 4718  . Highest education level: Not on file  Social Needs  . Financial resource strain: Not on file  . Food insecurity - worry: Not on file  . Food insecurity - inability: Not on file  . Transportation needs - medical: Not on file  . Transportation needs - non-medical: Not on file  Occupational History  . Occupation: self employed  Tobacco Use  . Smoking status: Never Smoker  . Smokeless tobacco: Never  Used  Substance and Sexual Activity  . Alcohol use: No  . Drug use: No  . Sexual activity: Yes    Birth control/protection: None  Other Topics Concern  . Not on file  Social History Narrative  . Not on file   Any cats in the household: no Denies history of and current domestic violence.  Allergy: Allergies  Allergen Reactions  . Amoxicillin Itching    Current Outpatient Medications:  Current Outpatient Medications:  .  cetirizine (ZYRTEC) 10 MG tablet, Take 10 mg by mouth daily., Disp: , Rfl:  .  pantoprazole (PROTONIX) 20 MG tablet, Take 1 tablet (20 mg total) by mouth daily., Disp: 30 tablet, Rfl: 2 .  Prenatal Vit-Fe Fumarate-FA (PRENATAL MULTIVITAMIN) TABS, Take 1 tablet by mouth daily., Disp: , Rfl:  .   sertraline (ZOLOFT) 50 MG tablet, Take 100 mg by mouth daily. , Disp: , Rfl:  .  busPIRone (BUSPAR) 15 MG tablet, Take 15 mg by mouth 2 (two) times daily., Disp: , Rfl:  .  fluticasone (FLONASE) 50 MCG/ACT nasal spray, Place 2 sprays into both nostrils 2 (two) times daily. (Patient not taking: Reported on 09/20/2017), Disp: 1 g, Rfl: 2   Physical Exam:   BP 140/80   Wt 173 lb (78.5 kg)   LMP 08/13/2017   BMI 30.65 kg/m  Body mass index is 30.65 kg/m. Constitutional: Well nourished, well developed female in no acute distress.  Neck:  Supple, normal appearance, and no thyromegaly  Cardiovascular: 1/6 SEM is heard at upper left sternal border, S1 and S2 normal, regular rate and rhythm Respiratory:  Clear to auscultation bilateral. Normal respiratory effort Abdomen: no masses, hernias; diffusely non tender to palpation, non distended Breasts: breasts appear normal, no suspicious masses, no skin or nipple changes or axillary nodes, risk and benefit of breast self-exam was discussed. Neuro/Psych:  Normal mood and affect.  Skin:  Warm and dry.  Lymphatic:  No inguinal lymphadenopathy.   Pelvic exam: is not limited by body habitus External genitalia, Bartholin's glands, Urethra, Skene's glands: within normal limits Vagina: within normal limits and with small amount of old blood in the vault  Cervix: normal appearing cervix without discharge or lesions, closed/long/high Uterus:  enlarged, c/w early pregnancy Adnexa:  no mass, fullness, tenderness  Assessment: [name] is a 32 y.o. G3P1011 105w1d based on Patient's last menstrual period was 08/13/2017. with an Estimated Date of Delivery: 05/20/18, presenting for prenatal care.  Plan:  1) Avoid alcoholic beverages. 2) Patient encouraged not to smoke.  3) Discontinue the use of all non-medicinal drugs and chemicals.  4) Take prenatal vitamins daily.  5) Seatbelt use advised 6) Nutrition, food safety (fish, cheese advisories, and high nitrite  foods) and exercise discussed. 7) Hospital and practice style delivering at [facility] discussed  8) Patient is asked about travel to areas at risk for the Zika virus, and counseled to avoid travel and exposure to mosquitoes or sexual partners who may have themselves been exposed to the virus. Testing is discussed, and will be ordered as appropriate.  9) [facility]birth classes at [facility] advised 10) Genetic Screening, such as with 1st Trimester Screening, cell free fetal DNA, AFP testing, and Ultrasound, as well as with amniocentesis and CVS as appropriate, is discussed with patient. She plans to decline genetic testing this pregnancy. 11) Bonjesta sample given for nausea. Discussed risks/benefits of Zofran. Patient will try Bonjesta and call for Rx, may desire Zofran if ineffective. 12) Discussed risks/benefits of continuing on Zoloft. Patient  has severe anxiety without medication and tried to discontinue during her last pregnancy without success. She will continue taking at lowest possible dose as managed by the prescribing provider. 13) SEM evaluated by cardiologist during prior pregnancy with normal echocardiogram. 14) CMP and P/C ratio today as baseline due to history of gHTN, questionable cHTN. 15) Ultrasound in one week for dates/viability.  Problem list reviewed and updated.  Return in about 1 week (around 10/02/2017) for ROB following ultrasound.   [name], [name] [facility], [facility] 09/25/2017  1:42 PM

## 2017-09-25 NOTE — Patient Instructions (Signed)
First Trimester of Pregnancy The first trimester of pregnancy is from week 1 until the end of week 13 (months 1 through 3). A week after a sperm fertilizes an egg, the egg will implant on the wall of the uterus. This embryo will begin to develop into a baby. Genes from you and your partner will form the baby. The female genes will determine whether the baby will be a boy or a girl. At 6-8 weeks, the eyes and face will be formed, and the heartbeat can be seen on ultrasound. At the end of 12 weeks, all the baby's organs will be formed. Now that you are pregnant, you will want to do everything you can to have a healthy baby. Two of the most important things are to get good prenatal care and to follow your health care provider's instructions. Prenatal care is all the medical care you receive before the baby's birth. This care will help prevent, find, and treat any problems during the pregnancy and childbirth. Body changes during your first trimester Your body goes through many changes during pregnancy. The changes vary from woman to woman.  You may gain or lose a couple of pounds at first.  You may feel sick to your stomach (nauseous) and you may throw up (vomit). If the vomiting is uncontrollable, call your health care provider.  You may tire easily.  You may develop headaches that can be relieved by medicines. All medicines should be approved by your health care provider.  You may urinate more often. Painful urination may mean you have a bladder infection.  You may develop heartburn as a result of your pregnancy.  You may develop constipation because certain hormones are causing the muscles that push stool through your intestines to slow down.  You may develop hemorrhoids or swollen veins (varicose veins).  Your breasts may begin to grow larger and become tender. Your nipples may stick out more, and the tissue that surrounds them (areola) may become darker.  Your gums may bleed and may be  sensitive to brushing and flossing.  Dark spots or blotches (chloasma, mask of pregnancy) may develop on your face. This will likely fade after the baby is born.  Your menstrual periods will stop.  You may have a loss of appetite.  You may develop cravings for certain kinds of food.  You may have changes in your emotions from day to day, such as being excited to be pregnant or being concerned that something may go wrong with the pregnancy and baby.  You may have more vivid and strange dreams.  You may have changes in your hair. These can include thickening of your hair, rapid growth, and changes in texture. Some women also have hair loss during or after pregnancy, or hair that feels dry or thin. Your hair will most likely return to normal after your baby is born.  What to expect at prenatal visits During a routine prenatal visit:  You will be weighed to make sure you and the baby are growing normally.  Your blood pressure will be taken.  Your abdomen will be measured to track your baby's growth.  The fetal heartbeat will be listened to between weeks 10 and 14 of your pregnancy.  Test results from any previous visits will be discussed.  Your health care provider may ask you:  How you are feeling.  If you are feeling the baby move.  If you have had any abnormal symptoms, such as leaking fluid, bleeding, severe headaches,   or abdominal cramping.  If you are using any tobacco products, including cigarettes, chewing tobacco, and electronic cigarettes.  If you have any questions.  Other tests that may be performed during your first trimester include:  Blood tests to find your blood type and to check for the presence of any previous infections. The tests will also be used to check for low iron levels (anemia) and protein on red blood cells (Rh antibodies). Depending on your risk factors, or if you previously had diabetes during pregnancy, you may have tests to check for high blood  sugar that affects pregnant women (gestational diabetes).  Urine tests to check for infections, diabetes, or protein in the urine.  An ultrasound to confirm the proper growth and development of the baby.  Fetal screens for spinal cord problems (spina bifida) and Down syndrome.  HIV (human immunodeficiency virus) testing. Routine prenatal testing includes screening for HIV, unless you choose not to have this test.  You may need other tests to make sure you and the baby are doing well.  Follow these instructions at home: Medicines  Follow your health care provider's instructions regarding medicine use. Specific medicines may be either safe or unsafe to take during pregnancy.  Take a prenatal vitamin that contains at least 600 micrograms (mcg) of folic acid.  If you develop constipation, try taking a stool softener if your health care provider approves. Eating and drinking  Eat a balanced diet that includes fresh fruits and vegetables, whole grains, good sources of protein such as meat, eggs, or tofu, and low-fat dairy. Your health care provider will help you determine the amount of weight gain that is right for you.  Avoid raw meat and uncooked cheese. These carry germs that can cause birth defects in the baby.  Eating four or five small meals rather than three large meals a day may help relieve nausea and vomiting. If you start to feel nauseous, eating a few soda crackers can be helpful. Drinking liquids between meals, instead of during meals, also seems to help ease nausea and vomiting.  Limit foods that are high in fat and processed sugars, such as fried and sweet foods.  To prevent constipation: ? Eat foods that are high in fiber, such as fresh fruits and vegetables, whole grains, and beans. ? Drink enough fluid to keep your urine clear or pale yellow. Activity  Exercise only as directed by your health care provider. Most women can continue their usual exercise routine during  pregnancy. Try to exercise for 30 minutes at least 5 days a week. Exercising will help you: ? Control your weight. ? Stay in shape. ? Be prepared for labor and delivery.  Experiencing pain or cramping in the lower abdomen or lower back is a good sign that you should stop exercising. Check with your health care provider before continuing with normal exercises.  Try to avoid standing for long periods of time. Move your legs often if you must stand in one place for a long time.  Avoid heavy lifting.  Wear low-heeled shoes and practice good posture.  You may continue to have sex unless your health care provider tells you not to. Relieving pain and discomfort  Wear a good support bra to relieve breast tenderness.  Take warm sitz baths to soothe any pain or discomfort caused by hemorrhoids. Use hemorrhoid cream if your health care provider approves.  Rest with your legs elevated if you have leg cramps or low back pain.  If you develop   varicose veins in your legs, wear support hose. Elevate your feet for 15 minutes, 3-4 times a day. Limit salt in your diet. Prenatal care  Schedule your prenatal visits by the twelfth week of pregnancy. They are usually scheduled monthly at first, then more often in the last 2 months before delivery.  Write down your questions. Take them to your prenatal visits.  Keep all your prenatal visits as told by your health care provider. This is important. Safety  Wear your seat belt at all times when driving.  Make a list of emergency phone numbers, including numbers for family, friends, the hospital, and police and fire departments. General instructions  Ask your health care provider for a referral to a local prenatal education class. Begin classes no later than the beginning of month 6 of your pregnancy.  Ask for help if you have counseling or nutritional needs during pregnancy. Your health care provider can offer advice or refer you to specialists for help  with various needs.  Do not use hot tubs, steam rooms, or saunas.  Do not douche or use tampons or scented sanitary pads.  Do not cross your legs for long periods of time.  Avoid cat litter boxes and soil used by cats. These carry germs that can cause birth defects in the baby and possibly loss of the fetus by miscarriage or stillbirth.  Avoid all smoking, herbs, alcohol, and medicines not prescribed by your health care provider. Chemicals in these products affect the formation and growth of the baby.  Do not use any products that contain nicotine or tobacco, such as cigarettes and e-cigarettes. If you need help quitting, ask your health care provider. You may receive counseling support and other resources to help you quit.  Schedule a dentist appointment. At home, brush your teeth with a soft toothbrush and be gentle when you floss. Contact a health care provider if:  You have dizziness.  You have mild pelvic cramps, pelvic pressure, or nagging pain in the abdominal area.  You have persistent nausea, vomiting, or diarrhea.  You have a bad smelling vaginal discharge.  You have pain when you urinate.  You notice increased swelling in your face, hands, legs, or ankles.  You are exposed to fifth disease or chickenpox.  You are exposed to German measles (rubella) and have never had it. Get help right away if:  You have a fever.  You are leaking fluid from your vagina.  You have spotting or bleeding from your vagina.  You have severe abdominal cramping or pain.  You have rapid weight gain or loss.  You vomit blood or material that looks like coffee grounds.  You develop a severe headache.  You have shortness of breath.  You have any kind of trauma, such as from a fall or a car accident. Summary  The first trimester of pregnancy is from week 1 until the end of week 13 (months 1 through 3).  Your body goes through many changes during pregnancy. The changes vary from  woman to woman.  You will have routine prenatal visits. During those visits, your health care provider will examine you, discuss any test results you may have, and talk with you about how you are feeling. This information is not intended to replace advice given to you by your health care provider. Make sure you discuss any questions you have with your health care provider. Document Released: 08/29/2001 Document Revised: 08/16/2016 Document Reviewed: 08/16/2016 Elsevier Interactive Patient Education  2018 Elsevier   Inc.  

## 2017-09-25 NOTE — Progress Notes (Signed)
C/o brown d/c, diarrhea, cramping, nausea.rj

## 2017-09-26 ENCOUNTER — Encounter: Payer: Self-pay | Admitting: Obstetrics & Gynecology

## 2017-09-26 LAB — RPR+RH+ABO+RUB AB+AB SCR+CB...
ANTIBODY SCREEN: NEGATIVE
HEMOGLOBIN: 12.4 g/dL (ref 11.1–15.9)
HIV SCREEN 4TH GENERATION: NONREACTIVE
Hematocrit: 38.1 % (ref 34.0–46.6)
Hepatitis B Surface Ag: NEGATIVE
MCH: 26.9 pg (ref 26.6–33.0)
MCHC: 32.5 g/dL (ref 31.5–35.7)
MCV: 83 fL (ref 79–97)
PLATELETS: 279 10*3/uL (ref 150–379)
RBC: 4.61 x10E6/uL (ref 3.77–5.28)
RDW: 15.7 % — ABNORMAL HIGH (ref 12.3–15.4)
RPR: NONREACTIVE
RUBELLA: 1.45 {index} (ref >0.99)
Rh Factor: POSITIVE
VARICELLA: 378 {index} (ref >165)
WBC: 12.5 10*3/uL — AB (ref 3.4–10.8)

## 2017-09-26 LAB — PROTEIN / CREATININE RATIO, URINE
Creatinine, Urine: 268.2 mg/dL
PROTEIN/CREAT RATIO: 104 mg/g{creat} (ref 0–200)
Protein, Ur: 27.8 mg/dL

## 2017-09-27 LAB — GC/CHLAMYDIA PROBE AMP
Chlamydia trachomatis, NAA: NEGATIVE
NEISSERIA GONORRHOEAE BY PCR: NEGATIVE

## 2017-09-27 LAB — URINE CULTURE

## 2017-09-30 ENCOUNTER — Other Ambulatory Visit: Payer: Self-pay | Admitting: Advanced Practice Midwife

## 2017-09-30 DIAGNOSIS — O219 Vomiting of pregnancy, unspecified: Secondary | ICD-10-CM

## 2017-09-30 MED ORDER — PROMETHAZINE HCL 25 MG PO TABS: 25.0000 mg | ORAL_TABLET | Freq: Four times a day (QID) | ORAL | 2 refills | Status: DC | PRN

## 2017-09-30 NOTE — Progress Notes (Signed)
Phenergan Rx sent per patient request for her nausea/vomiting. Health visitornsurance doesn't cover [employer]esta.

## 2017-10-01 ENCOUNTER — Other Ambulatory Visit: Payer: Self-pay | Admitting: Maternal Newborn

## 2017-10-01 ENCOUNTER — Telehealth: Payer: Self-pay

## 2017-10-01 DIAGNOSIS — O219 Vomiting of pregnancy, unspecified: Secondary | ICD-10-CM

## 2017-10-01 MED ORDER — BONJESTA 20-20 MG PO TBCR: 1.0000 | EXTENDED_RELEASE_TABLET | Freq: Every day | ORAL | 2 refills | Status: DC

## 2017-10-01 NOTE — Telephone Encounter (Signed)
Pt called after hour triage line stating she is out of the [employer]esta medication for Nausea. Please call in refill. She is not sure if insurance will cover it but has not tried Zofran or Phenergan.

## 2017-10-01 NOTE — Progress Notes (Signed)
Bonjesta Rx sent to pharmacy.  Marcelyn BruinsJacelyn Schmid, [name] 10/01/2017  1:26 PM

## 2017-10-04 ENCOUNTER — Ambulatory Visit (INDEPENDENT_AMBULATORY_CARE_PROVIDER_SITE_OTHER): Payer: Managed Care, Other (non HMO)

## 2017-10-04 ENCOUNTER — Ambulatory Visit (INDEPENDENT_AMBULATORY_CARE_PROVIDER_SITE_OTHER): Payer: Managed Care, Other (non HMO) | Admitting: Advanced Practice Midwife

## 2017-10-04 VITALS — BP 132/86 | Wt 172.0 lb

## 2017-10-04 DIAGNOSIS — R002 Palpitations: Secondary | ICD-10-CM | POA: Insufficient documentation

## 2017-10-04 DIAGNOSIS — K219 Gastro-esophageal reflux disease without esophagitis: Secondary | ICD-10-CM | POA: Insufficient documentation

## 2017-10-04 DIAGNOSIS — Z362 Encounter for other antenatal screening follow-up: Secondary | ICD-10-CM

## 2017-10-04 DIAGNOSIS — Z3481 Encounter for supervision of other normal pregnancy, first trimester: Secondary | ICD-10-CM

## 2017-10-04 DIAGNOSIS — Z3A01 Less than 8 weeks gestation of pregnancy: Secondary | ICD-10-CM

## 2017-10-04 DIAGNOSIS — M419 Scoliosis, unspecified: Secondary | ICD-10-CM | POA: Insufficient documentation

## 2017-10-04 DIAGNOSIS — K449 Diaphragmatic hernia without obstruction or gangrene: Secondary | ICD-10-CM | POA: Insufficient documentation

## 2017-10-04 DIAGNOSIS — Z3480 Encounter for supervision of other normal pregnancy, unspecified trimester: Secondary | ICD-10-CM

## 2017-10-04 DIAGNOSIS — I059 Rheumatic mitral valve disease, unspecified: Secondary | ICD-10-CM | POA: Insufficient documentation

## 2017-10-04 NOTE — Progress Notes (Signed)
  Routine Prenatal Care Visit  Subjective  [name] is a 32 y.o. G3P1011 at 4649w3d being seen today for ongoing prenatal care.  She is currently monitored for the following issues for this high-risk pregnancy and has Supervision of normal pregnancy; History of gestational hypertension; History of cesarean delivery, currently pregnant; Hiatal hernia; Mitral valve disorder; Palpitations; and Scoliosis deformity of spine on their problem list.  ----------------------------------------------------------------------------------- Patient reports hemorrhoids. Phenergan is helping nausea.    . Vag. Bleeding: None.   . Denies leaking of fluid.  ----------------------------------------------------------------------------------- The following portions of the patient's history were reviewed and updated as appropriate: allergies, current medications, past family history, past medical history, past social history, past surgical history and problem list. Problem list updated.   Objective  Blood pressure 132/86, weight 172 lb (78 kg), last menstrual period 08/13/2017, currently breastfeeding. Pregravid weight 168 lb (76.2 kg) Total Weight Gain 4 lb (1.814 kg) Urinalysis:      Fetal Status: Fetal Heart Rate (bpm): 153         General:  Alert, oriented and cooperative. Patient is in no acute distress.  Skin: Skin is warm and dry. No rash noted.   Cardiovascular: Normal heart rate noted  Respiratory: Normal respiratory effort, no problems with respiration noted  Abdomen: Soft, gravid, appropriate for gestational age.       Pelvic:  Cervical exam deferred        Extremities: Normal range of motion.     Mental Status: Normal mood and affect. Normal behavior. Normal judgment and thought content.   Assessment   32 y.o. G3P1011 at 6949w3d by  05/20/2018, by Last Menstrual Period presenting for routine prenatal visit  Plan   third Problems (from 09/25/17 to present)    Problem Noted Resolved   Supervision  of normal pregnancy 09/25/2017 by [name], [name], [name] No   Overview Addendum 10/01/2017  8:42 AM by [name], [name], [name]    Clinic [facility] Prenatal [facility]  Dating  Blood type:     Genetic Screen 1 Screen:    AFP:     Quad:     NIPS: Antibody:   Anatomic US  Rubella:   Varicella:    GTT Early:               Third trimester:  RPR:     Rhogam  HBsAg:     TDaP vaccine                       Flu Shot: HIV:     Baby Food                                GBS:   Contraception  Pap:  CBB     CS/VBAC 2014   Support Person                  Preterm labor symptoms and general obstetric precautions including but not limited to vaginal bleeding, contractions, leaking of fluid and fetal movement were reviewed in detail with the patient.   Return in about 4 weeks (around 11/01/2017) for rob.  Tresea MallJane Leno Mathes, [name]  10/04/2017 11:48 AM

## 2017-10-04 NOTE — Progress Notes (Signed)
U/s today. No vb. No lof.  

## 2017-10-04 NOTE — Progress Notes (Signed)
This established (LMP) EDD is 05/20/18 not 08/13/18

## 2017-10-05 LAB — URINE DRUG PANEL 7
Amphetamines, Urine: NEGATIVE ng/mL
BENZODIAZEPINE QUANT UR: NEGATIVE ng/mL
Barbiturate Quant, Ur: NEGATIVE ng/mL
CANNABINOID QUANT UR: NEGATIVE ng/mL
COCAINE (METAB.): NEGATIVE ng/mL
Opiate Quant, Ur: NEGATIVE ng/mL
PCP QUANT UR: NEGATIVE ng/mL

## 2017-10-15 ENCOUNTER — Telehealth: Payer: Self-pay

## 2017-10-15 NOTE — Telephone Encounter (Signed)
Give Bonjesta a few more days, it should be highly effective at preventing nausea.  Can still take Phenergan when has nausea, either PO or PR use (does she need Rx?) (Bonjesta prevents, Phenergan treats; OK to take both)

## 2017-10-15 NOTE — Telephone Encounter (Signed)
Pt aware. Also, aware to be seen if doesn't keep liquids down for 24hrs.

## 2017-10-15 NOTE — Telephone Encounter (Signed)
Just received Bonjesta, took one Sat HS, one Sunday am, Sunday HS.  Nausea is pretty bad and vomitting x1wk.  Pt wondering if she can still take phenergan and how long before bongesta takes full effect?  [phone]

## 2017-10-16 ENCOUNTER — Telehealth: Payer: Self-pay

## 2017-10-16 NOTE — Telephone Encounter (Signed)
Pt called back. Denies bleeding or cramping. Advised okay to take tylenol and use ice/heating pad for discomfort. Pt to notify if any other concerns.

## 2017-10-16 NOTE — Telephone Encounter (Signed)
Pt called stating that she is about [redacted] weeks pregnant and fell on the last 3-4 steps of her stairs and fell on her tail bone pretty hard and wants to make sure there is nothing to be concerned about. Left msg for pt to call back. Pt cb# [phone]

## 2017-10-30 ENCOUNTER — Ambulatory Visit (INDEPENDENT_AMBULATORY_CARE_PROVIDER_SITE_OTHER): Payer: Managed Care, Other (non HMO) | Admitting: Maternal Newborn

## 2017-10-30 VITALS — BP 130/80 | Wt 163.0 lb

## 2017-10-30 DIAGNOSIS — O26891 Other specified pregnancy related conditions, first trimester: Secondary | ICD-10-CM | POA: Diagnosis not present

## 2017-10-30 DIAGNOSIS — Z3481 Encounter for supervision of other normal pregnancy, first trimester: Secondary | ICD-10-CM

## 2017-10-30 DIAGNOSIS — R300 Dysuria: Secondary | ICD-10-CM

## 2017-10-30 DIAGNOSIS — O219 Vomiting of pregnancy, unspecified: Secondary | ICD-10-CM

## 2017-10-30 DIAGNOSIS — N898 Other specified noninflammatory disorders of vagina: Secondary | ICD-10-CM

## 2017-10-30 LAB — POCT URINALYSIS DIPSTICK
BILIRUBIN UA: NEGATIVE
Blood, UA: NEGATIVE
Glucose, UA: NEGATIVE
Ketones, UA: NEGATIVE
LEUKOCYTES UA: NEGATIVE
Nitrite, UA: NEGATIVE
UROBILINOGEN UA: NEGATIVE U/dL — AB
pH, UA: 5 (ref 5.0–8.0)

## 2017-10-30 MED ORDER — PROMETHAZINE HCL 25 MG PO TABS: 25.0000 mg | ORAL_TABLET | Freq: Four times a day (QID) | ORAL | 2 refills | Status: DC | PRN

## 2017-10-30 NOTE — Progress Notes (Signed)
C/o morning sickness, for about 3d last week did not eat solids just liquids; itching and discomfort, pink tinted d/c, some burning now and then c urinating.- See urinalysis.rj

## 2017-10-31 ENCOUNTER — Encounter: Payer: Self-pay | Admitting: Maternal Newborn

## 2017-10-31 ENCOUNTER — Telehealth: Payer: Self-pay

## 2017-10-31 ENCOUNTER — Other Ambulatory Visit: Payer: Self-pay | Admitting: Maternal Newborn

## 2017-10-31 DIAGNOSIS — B3731 Acute candidiasis of vulva and vagina: Secondary | ICD-10-CM

## 2017-10-31 DIAGNOSIS — B373 Candidiasis of vulva and vagina: Secondary | ICD-10-CM

## 2017-10-31 MED ORDER — TERCONAZOLE 0.4 % VA CREA: 1.0000 | TOPICAL_CREAM | Freq: Every day | VAGINAL | 1 refills | Status: AC

## 2017-10-31 NOTE — Telephone Encounter (Signed)
Pt was seen yesterday by JS.  Calling for test results.  Sxs are worse today - burning in area.  Can she have RX?  [phone]

## 2017-10-31 NOTE — Progress Notes (Signed)
    Routine Prenatal Care Visit  Subjective  [name] is a 32 y.o. G3P1011 at 2776w2d being seen today for ongoing prenatal care.  She is currently monitored for the following issues for this low-risk pregnancy and has Supervision of normal pregnancy; History of gestational hypertension; History of cesarean delivery, currently pregnant; Hiatal hernia; Mitral valve disorder; Palpitations; and Scoliosis deformity of spine on their problem list.  ----------------------------------------------------------------------------------- Patient reports nausea, vaginal irritation and vomiting. Has some vaginal itching and burning on urination. She had a GI virus last week during which she could only tolerate liquids, but otherwise getting some relief from Phenergan and can tolerate solids. Vag. Bleeding: None. Denies leaking of fluid.  ----------------------------------------------------------------------------------- The following portions of the patient's history were reviewed and updated as appropriate: allergies, current medications, past family history, past medical history, past social history, past surgical history and problem list. Problem list updated.   Objective  Blood pressure 130/80, weight 163 lb (73.9 kg), last menstrual period 08/13/2017, currently breastfeeding. Pregravid weight 168 lb (76.2 kg) Total Weight Gain  (-2.268 kg) Urinalysis: Urine Protein: Trace Urine Glucose: Negative  Fetal Status: Fetal Heart Rate (bpm): 154         General:  Alert, oriented and cooperative. Patient is in no acute distress.  Skin: Skin is warm and dry. No rash noted.   Cardiovascular: Normal heart rate noted  Respiratory: Normal respiratory effort, no problems with respiration noted  Abdomen: Soft, gravid, appropriate for gestational age. Pain/Pressure: Absent     Pelvic:  Cervical exam deferred        Extremities: Normal range of motion.     Mental Status: Normal mood and affect. Normal behavior.  Normal judgment and thought content.     Assessment   31 y.o. G3P1011 at 5476w2d, EDD 05/20/2018 by Last Menstrual Period presenting for routine prenatal visit.  Plan   third Problems (from 09/25/17 to present)    Problem Noted Resolved   Supervision of normal pregnancy 09/25/2017 by [name], Nicholson Starace Y, [name] No   Overview Addendum 10/01/2017  8:42 AM by [name], Webster Patrone Y, [name]    [facility] Prenatal Labs  Dating  Blood type:     Genetic Screen 1 Screen:    AFP:     Quad:     NIPS: Antibody:   Anatomic US  Rubella:   Varicella:    GTT Early:               Third trimester:  RPR:     Rhogam  HBsAg:     TDaP vaccine                       Flu Shot: HIV:     Baby Food                                GBS:   Contraception  Pap:  CBB     CS/VBAC    Support Person               UA negative but sent urine culture due to symptoms. Wet prep negative but vaginal irritation present, sent NuSwab.  Preterm labor symptoms and general obstetric precautions including but not limited to vaginal bleeding, contractions, leaking of fluid and fetal movement were reviewed in detail with the patient.  Return in about 4 weeks (around 11/27/2017) for ROB.  Marcelyn BruinsJacelyn Reighlyn Elmes, [name] 10/31/2017  8:07 PM

## 2017-11-01 ENCOUNTER — Encounter: Payer: Managed Care, Other (non HMO) | Admitting: Certified Nurse Midwife

## 2017-11-01 ENCOUNTER — Encounter: Payer: Managed Care, Other (non HMO) | Admitting: Advanced Practice Midwife

## 2017-11-01 LAB — URINE CULTURE

## 2017-11-01 LAB — NUSWAB VAGINITIS PLUS (VG+)
Candida albicans, NAA: NEGATIVE
Candida glabrata, NAA: NEGATIVE
Chlamydia trachomatis, NAA: NEGATIVE
NEISSERIA GONORRHOEAE, NAA: NEGATIVE
TRICH VAG BY NAA: NEGATIVE

## 2017-11-08 ENCOUNTER — Telehealth: Payer: Self-pay

## 2017-11-08 DIAGNOSIS — O99711 Diseases of the skin and subcutaneous tissue complicating pregnancy, first trimester: Principal | ICD-10-CM

## 2017-11-08 DIAGNOSIS — L299 Pruritus, unspecified: Secondary | ICD-10-CM

## 2017-11-08 NOTE — Treatment Plan (Signed)
Left message with patient about need to come in fasting to have labs drawn to rule out cholestasis as the reason for her pruritus.  Marcelyn BruinsJacelyn Foday Cone, [name] 11/08/2017  2:13 PM

## 2017-11-08 NOTE — Telephone Encounter (Signed)
Pt calling with c/o itching all over her body, especially at night on soles of feet, legs, arms, back, scalp and eyes. She has not been taking zyrtec during first trimester and wonders if this is related or if she needs to be tested for cholestasis due to having to be tested for it during previous pregnancy. She was negative during previous pregnancy.   Advised pt to make sure she is well hydrated and moisturizing skin to rule out anything else. She did take a zyrtec this morning to see if that will help.   Please advise for any other recommendations. Pt aware JS out of the office until this afternoon. Thank you.   Pt cb# [phone]

## 2017-11-09 ENCOUNTER — Other Ambulatory Visit: Payer: Managed Care, Other (non HMO)

## 2017-11-11 ENCOUNTER — Other Ambulatory Visit: Payer: Self-pay

## 2017-11-11 ENCOUNTER — Encounter: Payer: Self-pay | Admitting: Emergency Medicine

## 2017-11-11 ENCOUNTER — Emergency Department
Admission: EM | Admit: 2017-11-11 | Discharge: 2017-11-11 | Disposition: A | Discharge status: Home / Self Care | Payer: Managed Care, Other (non HMO) | Attending: Emergency Medicine | Admitting: Emergency Medicine

## 2017-11-11 ENCOUNTER — Emergency Department: Payer: Managed Care, Other (non HMO)

## 2017-11-11 DIAGNOSIS — Z79899 Other long term (current) drug therapy: Secondary | ICD-10-CM | POA: Diagnosis not present

## 2017-11-11 DIAGNOSIS — Z3A13 13 weeks gestation of pregnancy: Secondary | ICD-10-CM | POA: Insufficient documentation

## 2017-11-11 DIAGNOSIS — O219 Vomiting of pregnancy, unspecified: Secondary | ICD-10-CM | POA: Insufficient documentation

## 2017-11-11 LAB — URINALYSIS, COMPLETE (UACMP) WITH MICROSCOPIC
Bilirubin Urine: NEGATIVE
GLUCOSE, UA: NEGATIVE mg/dL
Hgb urine dipstick: NEGATIVE
KETONES UR: 80 mg/dL — AB
Leukocytes, UA: NEGATIVE
Nitrite: NEGATIVE
PH: 5 (ref 5.0–8.0)
Protein, ur: NEGATIVE mg/dL
SPECIFIC GRAVITY, URINE: 1.021 (ref 1.005–1.030)

## 2017-11-11 LAB — COMPREHENSIVE METABOLIC PANEL
ALBUMIN: 4 g/dL (ref 3.5–5.0)
ALT: 14 U/L (ref 14–54)
AST: 25 U/L (ref 15–41)
Alkaline Phosphatase: 67 U/L (ref 38–126)
Anion gap: 12 (ref 5–15)
BUN: 9 mg/dL (ref 6–20)
CHLORIDE: 101 mmol/L (ref 101–111)
CO2: 22 mmol/L (ref 22–32)
CREATININE: 0.57 mg/dL (ref 0.44–1.00)
Calcium: 9.3 mg/dL (ref 8.9–10.3)
GFR calc Af Amer: >60 mL/min (ref >60)
GLUCOSE: 102 mg/dL — AB (ref 65–99)
Potassium: 3.6 mmol/L (ref 3.5–5.1)
SODIUM: 135 mmol/L (ref 135–145)
Total Bilirubin: 0.4 mg/dL (ref 0.3–1.2)
Total Protein: 8.3 g/dL — ABNORMAL HIGH (ref 6.5–8.1)

## 2017-11-11 LAB — HCG, QUANTITATIVE, PREGNANCY: hCG, Beta Chain, Quant, S: 105812 m[IU]/mL — ABNORMAL HIGH (ref <5)

## 2017-11-11 LAB — BETA-HYDROXYBUTYRIC ACID: BETA-HYDROXYBUTYRIC ACID: 0.92 mmol/L — AB (ref 0.05–0.27)

## 2017-11-11 LAB — CBC
HCT: 44.3 % (ref 35.0–47.0)
Hemoglobin: 14.6 g/dL (ref 12.0–16.0)
MCH: 27.6 pg (ref 26.0–34.0)
MCHC: 33 g/dL (ref 32.0–36.0)
MCV: 83.7 fL (ref 80.0–100.0)
PLATELETS: 283 10*3/uL (ref 150–440)
RBC: 5.29 MIL/uL — ABNORMAL HIGH (ref 3.80–5.20)
RDW: 15.9 % — AB (ref 11.5–14.5)
WBC: 14 10*3/uL — ABNORMAL HIGH (ref 3.6–11.0)

## 2017-11-11 LAB — LIPASE, BLOOD: LIPASE: 28 U/L (ref 11–51)

## 2017-11-11 MED ORDER — METOCLOPRAMIDE HCL 10 MG PO TABS: 10.0000 mg | ORAL_TABLET | Freq: Three times a day (TID) | ORAL | 0 refills | Status: DC

## 2017-11-11 MED ORDER — DEXTROSE 5 % AND 0.9 % NACL IV BOLUS
1000.0000 mL | Freq: Once | INTRAVENOUS | Status: DC
  Filled 2017-11-11: qty 1000

## 2017-11-11 MED ORDER — DEXTROSE 50 % IV SOLN
1.0000 | Freq: Once | INTRAVENOUS | Status: AC
Start: 2017-11-11 — End: 2017-11-11
  Administered 2017-11-11: 50 mL via INTRAVENOUS

## 2017-11-11 MED ORDER — DEXTROSE 50 % IV SOLN
INTRAVENOUS | Status: AC
  Filled 2017-11-11: qty 100

## 2017-11-11 MED ORDER — SODIUM CHLORIDE 0.9 % IV BOLUS (SEPSIS)
1000.0000 mL | Freq: Once | INTRAVENOUS | Status: AC
  Administered 2017-11-11: 1000 mL via INTRAVENOUS

## 2017-11-11 MED ORDER — METOCLOPRAMIDE HCL 5 MG/ML IJ SOLN
10.0000 mg | Freq: Once | INTRAMUSCULAR | Status: AC
  Administered 2017-11-11: 10 mg via INTRAVENOUS
  Filled 2017-11-11: qty 2

## 2017-11-11 MED ORDER — DEXTROSE 50 % IV SOLN
1.0000 | Freq: Once | INTRAVENOUS | Status: AC
  Administered 2017-11-11: 50 mL via INTRAVENOUS

## 2017-11-11 NOTE — ED Provider Notes (Signed)
[facility] Emergency Department Provider Note  ____________________________________________   First MD Initiated Contact with Patient 11/11/17 1845     (approximate)  I have reviewed the triage vital signs and the nursing notes.   HISTORY  Chief Complaint Emesis    HPI [name] is a 32 y.o. female who self presents the emergency department with multiple complaints.  She is G3 P1 and roughly [redacted] weeks pregnant with a desired pregnancy.  She has reported nausea vomiting and several episodes of diarrhea for the past several days.  Her symptoms were gradual onset and intermittent.  They are worse when trying to eat.  They are now moderate to severe.  She is concerned that she has become dehydrated.  She has been taking bonjesta with minimal relief.  She is also concerned that she might have cholestasis because her arms have been itching.  She also reports having a recent yeast infection and some light pink spotting in her underwear.  She also reports having painful stools and is concerned she might have a hemorrhoid.  Past Medical History:  Diagnosis Date  . Anxiety   . Hiatal hernia   . 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 Palpitations   . Scoliosis   . Trouble in sleeping     Patient Active Problem List   Diagnosis Date Noted  . Hiatal hernia 10/04/2017  . Mitral valve disorder 10/04/2017  . Palpitations 10/04/2017  . Scoliosis deformity of spine 10/04/2017  . Supervision of normal pregnancy 09/25/2017  . History of gestational hypertension 09/25/2017  . History of cesarean delivery, currently pregnant 09/25/2017    Past Surgical History:  Procedure Laterality Date  . CESAREAN SECTION N/A 03/04/2013   Procedure: CESAREAN SECTION;  Surgeon: Bing Plume, MD;  Location: [facility]S;  Service: Obstetrics;  Laterality: N/A;  . NO PAST SURGERIES      Prior to Admission medications   Medication Sig Start Date End Date Taking? Authorizing Provider   BONJESTA 20-20 MG TBCR Take 1 tablet by mouth at bedtime. May take one additional tablet daily as needed. 10/01/17   Oswaldo Conroy, [name]  cetirizine (ZYRTEC) 10 MG tablet Take 10 mg by mouth daily.    [provider]  metoCLOPramide (REGLAN) 10 MG tablet Take 1 tablet (10 mg total) by mouth 3 (three) times daily with meals. 11/11/17 11/11/18  Merrily Brittle, MD  pantoprazole (PROTONIX) 20 MG tablet Take 1 tablet (20 mg total) by mouth daily. 03/07/13   Tracey Harries, MD  Prenatal Vit-Fe Fumarate-FA (PRENATAL MULTIVITAMIN) TABS Take 1 tablet by mouth daily.    [provider]  promethazine (PHENERGAN) 25 MG tablet Take 1 tablet (25 mg total) by mouth every 6 (six) hours as needed for nausea or vomiting. 10/30/17   Oswaldo Conroy, [name]  sertraline (ZOLOFT) 50 MG tablet Take 100 mg by mouth daily.     [provider]    Allergies Amoxicillin  Family History  Problem Relation Age of Onset  . Diabetes Mother   . Hypertension Mother   . Kidney disease Mother   . Hypertension Father     Social History Social History   Tobacco Use  . Smoking status: Never Smoker  . Smokeless tobacco: Never Used  Substance Use Topics  . Alcohol use: No  . Drug use: No    Review of Systems Constitutional: No fever/chills Eyes: No visual changes. ENT: No sore throat. Cardiovascular: Denies chest pain. Respiratory: Denies shortness of  breath. Gastrointestinal: No abdominal pain.  Positive for nausea, or for vomiting.  Positive for diarrhea.  No constipation. Genitourinary: Negative for dysuria. Musculoskeletal: Negative for back pain. Skin: Negative for rash. Neurological: Negative for headaches, focal weakness or numbness.   ____________________________________________   PHYSICAL EXAM:  VITAL SIGNS: ED Triage Vitals  Enc Vitals Group     BP 11/11/17 1754 (!) 159/91     Pulse Rate 11/11/17 1754 (!) 115     Resp 11/11/17 1754 20     Temp 11/11/17 1754 99.2 F  (37.3 C)     Temp Source 11/11/17 1754 Oral     SpO2 11/11/17 1754 99 %     Weight 11/11/17 1752 158 lb (71.7 kg)     Height 11/11/17 1752 5\' 3"  (1.6 m)     Head Circumference --      Peak Flow --      Pain Score --      Pain Loc --      Pain Edu? --      Excl. in GC? --     Constitutional: Alert and oriented x4 pleasant cooperative speaks in full clear sentences no diaphoresis Eyes: PERRL EOMI. Head: Atraumatic. Nose: No congestion/rhinnorhea. Mouth/Throat: No trismus Neck: No stridor.   Cardiovascular: Normal rate, regular rhythm. Grossly normal heart sounds.  Good peripheral circulation. Respiratory: Normal respiratory effort.  No retractions. Lungs CTAB and moving good air Gastrointestinal: Soft nontender Rectal exam chaperoned by female tech Stephanie: Normal external exam no external hemorrhoids brown stool no internal hemorrhoids appreciated Musculoskeletal: No lower extremity edema   Neurologic:  Normal speech and language. No gross focal neurologic deficits are appreciated. Skin:  Skin is warm, dry and intact. No rash noted. Psychiatric: Mood and affect are normal. Speech and behavior are normal.    ____________________________________________   DIFFERENTIAL includes but not limited to  Miscarriage, threatened miscarriage, internal hemorrhoid, external hemorrhoid, cholestasis of pregnancy, morning sickness, hyperemesis gravidarum ____________________________________________   LABS (all labs ordered are listed, but only abnormal results are displayed)  Labs Reviewed  COMPREHENSIVE METABOLIC PANEL - Abnormal; Notable for the following components:      Result Value   Glucose, Bld 102 (*)    Total Protein 8.3 (*)    All other components within normal limits  CBC - Abnormal; Notable for the following components:   WBC 14.0 (*)    RBC 5.29 (*)    RDW 15.9 (*)    All other components within normal limits  URINALYSIS, COMPLETE (UACMP) WITH MICROSCOPIC - Abnormal;  Notable for the following components:   Color, Urine YELLOW (*)    APPearance HAZY (*)    Ketones, ur 80 (*)    Bacteria, UA RARE (*)    Squamous Epithelial / LPF 0-5 (*)    All other components within normal limits  HCG, QUANTITATIVE, PREGNANCY - Abnormal; Notable for the following components:   hCG, Beta Chain, Quant, S 161,096 (*)    All other components within normal limits  BETA-HYDROXYBUTYRIC ACID - Abnormal; Notable for the following components:   Beta-Hydroxybutyric Acid 0.92 (*)    All other components within normal limits  LIPASE, BLOOD    Lab work reviewed by me shows ketosis and elevated beta hydroxybutyric acid consistent with starvation.  Elevated white count is nonspecific __________________________________________  EKG   ____________________________________________  RADIOLOGY  Ultrasound reviewed by me with no acute disease ____________________________________________   PROCEDURES  Procedure(s) performed: no  Procedures  Critical Care performed: no  Observation: no ____________________________________________   INITIAL IMPRESSION / ASSESSMENT AND PLAN / ED COURSE  Pertinent labs & imaging results that were available during my care of the patient were reviewed by me and considered in my medical decision making (see chart for details).  Fortunately the patient's ultrasound is reassuring.  She is clinically somewhat dehydrated and given 1 L of normal saline with 2 A of D50 along with 10 mg of intravenous Reglan with improvement in her symptoms.  Regarding her spotting does not appear to be a threatened miscarriage.  She has no evidence of hemorrhoids.  I will discharge her home with Reglan as an outpatient.  She verbalizes understanding and agreement with the plan.      ____________________________________________   FINAL CLINICAL IMPRESSION(S) / ED DIAGNOSES  Final diagnoses:  Nausea and vomiting in pregnancy      NEW MEDICATIONS STARTED  DURING THIS VISIT:  Discharge Medication List as of 11/11/2017  9:40 PM    START taking these medications   Details  metoCLOPramide (REGLAN) 10 MG tablet Take 1 tablet (10 mg total) by mouth 3 (three) times daily with meals., Starting Sun 11/11/2017, Until Mon 11/11/2018, Print         Note:  This document was prepared using Dragon voice recognition software and may include unintentional dictation errors.     Merrily Brittleifenbark, Royston Bekele, MD 11/12/17 2056

## 2017-11-11 NOTE — ED Notes (Signed)
Iv discontinued from left forearm.  IV cath intact.

## 2017-11-11 NOTE — ED Triage Notes (Addendum)
Pt has had NVD since Thursday.  Is [redacted] weeks pregnant.  G3P1.  Some lower abdominal cramping with diarrhea and vomiting.  Has had some spotting as well on tissue per pt but is minimal; her OB told her to mention.  Has had burning in vagina. Has had ABO in epic

## 2017-11-11 NOTE — Discharge Instructions (Signed)
Fortunately today your ultrasound and blood work were reassuring.  Please take your nausea medication as needed and follow up with your OB and your PMD as scheduled.  It was a pleasure to take care of you today, and thank you for coming to our emergency department.  If you have any questions or concerns before leaving please ask the nurse to grab me and I'm more than happy to go through your aftercare instructions again.  If you were prescribed any opioid pain medication today such as Norco, Vicodin, Percocet, morphine, hydrocodone, or oxycodone please make sure you do not drive when you are taking this medication as it can alter your ability to drive safely.  If you have any concerns once you are home that you are not improving or are in fact getting worse before you can make it to your follow-up appointment, please do not hesitate to call 911 and come back for further evaluation.  Merrily BrittleNeil Todd Jelinski, MD  Results for orders placed or performed during the hospital encounter of 11/11/17  Lipase, blood  Result Value Ref Range   Lipase 28 11 - 51 U/L  Comprehensive metabolic panel  Result Value Ref Range   Sodium 135 135 - 145 mmol/L   Potassium 3.6 3.5 - 5.1 mmol/L   Chloride 101 101 - 111 mmol/L   CO2 22 22 - 32 mmol/L   Glucose, Bld 102 (H) 65 - 99 mg/dL   BUN 9 6 - 20 mg/dL   Creatinine, Ser 9.810.57 0.44 - 1.00 mg/dL   Calcium 9.3 8.9 - 19.110.3 mg/dL   Total Protein 8.3 (H) 6.5 - 8.1 g/dL   Albumin 4.0 3.5 - 5.0 g/dL   AST 25 15 - 41 U/L   ALT 14 14 - 54 U/L   Alkaline Phosphatase 67 38 - 126 U/L   Total Bilirubin 0.4 0.3 - 1.2 mg/dL   GFR calc non Af Amer >60 >60 mL/min   GFR calc Af Amer >60 >60 mL/min   Anion gap 12 5 - 15  CBC  Result Value Ref Range   WBC 14.0 (H) 3.6 - 11.0 K/uL   RBC 5.29 (H) 3.80 - 5.20 MIL/uL   Hemoglobin 14.6 12.0 - 16.0 g/dL   HCT 47.844.3 29.535.0 - 62.147.0 %   MCV 83.7 80.0 - 100.0 fL   MCH 27.6 26.0 - 34.0 pg   MCHC 33.0 32.0 - 36.0 g/dL   RDW 30.815.9 (H) 65.711.5 - 84.614.5  %   Platelets 283 150 - 440 K/uL  Urinalysis, Complete w Microscopic  Result Value Ref Range   Color, Urine YELLOW (A) YELLOW   APPearance HAZY (A) CLEAR   Specific Gravity, Urine 1.021 1.005 - 1.030   pH 5.0 5.0 - 8.0   Glucose, UA NEGATIVE NEGATIVE mg/dL   Hgb urine dipstick NEGATIVE NEGATIVE   Bilirubin Urine NEGATIVE NEGATIVE   Ketones, ur 80 (A) NEGATIVE mg/dL   Protein, ur NEGATIVE NEGATIVE mg/dL   Nitrite NEGATIVE NEGATIVE   Leukocytes, UA NEGATIVE NEGATIVE   RBC / HPF 0-5 0 - 5 RBC/hpf   WBC, UA 0-5 0 - 5 WBC/hpf   Bacteria, UA RARE (A) NONE SEEN   Squamous Epithelial / LPF 0-5 (A) NONE SEEN   Mucus PRESENT   hCG, quantitative, pregnancy  Result Value Ref Range   hCG, Beta Chain, Quant, S 105,812 (H) <5 mIU/mL  Beta-hydroxybutyric acid  Result Value Ref Range   Beta-Hydroxybutyric Acid 0.92 (H) 0.05 - 0.27 mmol/L   [place]  Ob Comp < 14 Wks  Result Date: 11/11/2017 CLINICAL DATA:  Vaginal bleeding. Gestational age by LMP of 12 weeks 6 days. EXAM: OBSTETRIC <14 WK ULTRASOUND TECHNIQUE: Transabdominal ultrasound was performed for evaluation of the gestation as well as the maternal uterus and adnexal regions. COMPARISON:  None. FINDINGS: Intrauterine gestational sac: Single Yolk sac:  Not Visualized. Embryo:  Visualized. Cardiac Activity: Visualized. Heart Rate: 173 bpm CRL:   72 mm   13 w 3 d                  [place] EDC: 05/16/2018 Subchorionic hemorrhage:  None visualized. Maternal uterus/adnexae: Small left ovarian corpus luteum cyst noted. Normal appearance of right ovary. No evidence of mass or abnormal free fluid. IMPRESSION: Single living IUP measuring 13 weeks 3 days, which is concordant with LMP. No significant maternal uterine or adnexal abnormality identified. Electronically Signed   By: Myles Rosenthal M.D.   On: 11/11/2017 19:42

## 2017-11-12 ENCOUNTER — Other Ambulatory Visit: Payer: Managed Care, Other (non HMO)

## 2017-11-12 ENCOUNTER — Telehealth: Payer: Self-pay | Admitting: Certified Nurse Midwife

## 2017-11-12 DIAGNOSIS — L299 Pruritus, unspecified: Secondary | ICD-10-CM

## 2017-11-12 DIAGNOSIS — O99711 Diseases of the skin and subcutaneous tissue complicating pregnancy, first trimester: Secondary | ICD-10-CM

## 2017-11-12 NOTE — Telephone Encounter (Signed)
lmtrc

## 2017-11-12 NOTE — Telephone Encounter (Signed)
Pt aware.

## 2017-11-12 NOTE — Telephone Encounter (Signed)
Yes she can do both Reglan for prevention Phenergan when she still feels sick

## 2017-11-12 NOTE — Telephone Encounter (Signed)
Patient has question about med, was seen in ER over the weekend and given nausea med, she is unsure if she should continue nausea med she is already on too or just take the new medication

## 2017-11-12 NOTE — Telephone Encounter (Signed)
Pt wants to know if she can still take the Phenergan as needed with the Reglan? She says the [place] does not help any, so I told her just quit the bonjesta. Please advise

## 2017-11-12 NOTE — Telephone Encounter (Signed)
Yes, that is also how we prescribe it for nausea in pregnancy

## 2017-11-12 NOTE — Telephone Encounter (Signed)
ER prescribed Reglan 3 times aday. Pt wants to know if this is safe for her to take. Please advise

## 2017-11-12 NOTE — Telephone Encounter (Signed)
Pt returning JP's call.  Please call back.  [phone]

## 2017-11-13 LAB — BILE ACIDS, TOTAL: BILE ACIDS TOTAL: 3.6 umol/L — AB (ref 4.7–24.5)

## 2017-11-13 LAB — AST: AST: 11 IU/L (ref 0–40)

## 2017-11-13 LAB — ALT: ALT: 10 IU/L (ref 0–32)

## 2017-11-15 ENCOUNTER — Telehealth: Payer: Self-pay

## 2017-11-15 NOTE — Telephone Encounter (Signed)
Pt is currently 13 weeks with c/o back pain in middle of back at bra line that wraps around to front. She feels like her posture affects pain and also has scoliosis. Muscles feel stretched and sore and also some rib pain.   Advised okay to take tylenol and comfort measures to include heating pad, correcting posture, epsom salt bath with water temp lower than 100 and stretching to help with muscle stiffness.   Pt had stomach flu last week and still having daily vomiting. Advised that rib and back pain could be related to force from vomiting. Encouraged fluids. Pt states nausea is getting slightly better and she is able to keep food down during the day. Pt d/c reglan due to side effects.   Pt appreciative and voiced understanding.

## 2017-11-21 ENCOUNTER — Telehealth: Payer: Self-pay

## 2017-11-21 NOTE — Telephone Encounter (Signed)
Pt called after hour nurse last evening at 6:29pm c/o a stomach bug since that morning, 14wks preg, vomited x5, took phenergan, urinated around 5pm, denies fever.  [phone]  Left detailed msg for pt to call and sched appt.

## 2017-11-21 NOTE — Telephone Encounter (Signed)
Pt returning call from this am.  She is no longer cramping and feels much better today.  She will wait to be seen at her appt.  Adv I agreed, cramping is normal as long as it doesn't double her over or stop her in her tracks.  She aware to be seen if she goes 24hrs and doesn't keep liquids down.

## 2017-11-24 ENCOUNTER — Emergency Department
Admission: EM | Admit: 2017-11-24 | Discharge: 2017-11-24 | Disposition: A | Discharge status: Home / Self Care | Payer: Managed Care, Other (non HMO) | Attending: Emergency Medicine | Admitting: Emergency Medicine

## 2017-11-24 ENCOUNTER — Encounter: Payer: Self-pay | Admitting: Emergency Medicine

## 2017-11-24 ENCOUNTER — Other Ambulatory Visit: Payer: Self-pay

## 2017-11-24 DIAGNOSIS — Z79899 Other long term (current) drug therapy: Secondary | ICD-10-CM | POA: Insufficient documentation

## 2017-11-24 DIAGNOSIS — N898 Other specified noninflammatory disorders of vagina: Secondary | ICD-10-CM | POA: Insufficient documentation

## 2017-11-24 LAB — WET PREP, GENITAL
Clue Cells Wet Prep HPF POC: NONE SEEN
Sperm: NONE SEEN
Trich, Wet Prep: NONE SEEN
Yeast Wet Prep HPF POC: NONE SEEN

## 2017-11-24 LAB — URINALYSIS, ROUTINE W REFLEX MICROSCOPIC
BILIRUBIN URINE: NEGATIVE
Glucose, UA: NEGATIVE mg/dL
HGB URINE DIPSTICK: NEGATIVE
Ketones, ur: NEGATIVE mg/dL
Leukocytes, UA: NEGATIVE
NITRITE: NEGATIVE
PROTEIN: NEGATIVE mg/dL
Specific Gravity, Urine: 1.018 (ref 1.005–1.030)
pH: 6 (ref 5.0–8.0)

## 2017-11-24 LAB — CHLAMYDIA/NGC RT PCR (ARMC ONLY)
CHLAMYDIA TR: NOT DETECTED
N GONORRHOEAE: NOT DETECTED

## 2017-11-24 LAB — HCG, QUANTITATIVE, PREGNANCY: HCG, BETA CHAIN, QUANT, S: 58670 m[IU]/mL — AB (ref <5)

## 2017-11-24 LAB — POCT PREGNANCY, URINE: Preg Test, Ur: POSITIVE — AB

## 2017-11-24 NOTE — Discharge Instructions (Signed)
we have not found anything to treat any vaginal discharge. It may just be the more profuse discharged in women get whether pregnant. Please follow-up with your OB/GYN doctor as planned. Please return for any further problems.

## 2017-11-24 NOTE — ED Provider Notes (Signed)
[facility] Emergency Department Provider Note   ____________________________________________   First MD Initiated Contact with Patient 11/24/17 1256     (approximate)  I have reviewed the triage vital signs and the nursing notes.   HISTORY  Chief Complaint Vaginal Discharge  HPI [name] is a 32 y.o. female Who tells me she has a more prominent clear to yellow vaginal discharge for the last few weeks. She is [redacted] weeks pregnant. She denied any a nor cramping to me.  Past Medical History:  Diagnosis Date  . Anxiety   . Hiatal hernia   . 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 Palpitations   . Scoliosis   . Trouble in sleeping     Patient Active Problem List   Diagnosis Date Noted  . Hiatal hernia 10/04/2017  . Mitral valve disorder 10/04/2017  . Palpitations 10/04/2017  . Scoliosis deformity of spine 10/04/2017  . Supervision of normal pregnancy 09/25/2017  . History of gestational hypertension 09/25/2017  . History of cesarean delivery, currently pregnant 09/25/2017    Past Surgical History:  Procedure Laterality Date  . CESAREAN SECTION N/A 03/04/2013   Procedure: CESAREAN SECTION;  Surgeon: Bing Plumehomas F Henley, MD;  Location: [facility];  Service: Obstetrics;  Laterality: N/A;  . NO PAST SURGERIES      Prior to Admission medications   Medication Sig Start Date End Date Taking? Authorizing Provider  BONJESTA 20-20 MG TBCR Take 1 tablet by mouth at bedtime. May take one additional tablet daily as needed. 10/01/17   [name], [name], [name]  cetirizine (ZYRTEC) 10 MG tablet Take 10 mg by mouth daily.    [provider]  metoCLOPramide (REGLAN) 10 MG tablet Take 1 tablet (10 mg total) by mouth 3 (three) times daily with meals. 11/11/17 11/11/18  Merrily Brittleifenbark, Neil, MD  pantoprazole (PROTONIX) 20 MG tablet Take 1 tablet (20 mg total) by mouth daily. 03/07/13   Tracey HarriesHenley, Thomas, MD  Prenatal Vit-Fe Fumarate-FA (PRENATAL MULTIVITAMIN) TABS Take 1  tablet by mouth daily.    [provider]  promethazine (PHENERGAN) 25 MG tablet Take 1 tablet (25 mg total) by mouth every 6 (six) hours as needed for nausea or vomiting. 10/30/17   [name], [name], [name]  sertraline (ZOLOFT) 50 MG tablet Take 100 mg by mouth daily.     [provider]    Allergies Amoxicillin  Family History  Problem Relation Age of Onset  . Diabetes Mother   . Hypertension Mother   . Kidney disease Mother   . Hypertension Father     Social History Social History   Tobacco Use  . Smoking status: Never Smoker  . Smokeless tobacco: Never Used  Substance Use Topics  . Alcohol use: No  . Drug use: No    Review of Systems  Constitutional: No fever/chills Eyes: No visual changes. ENT: No sore throat. Cardiovascular: Denies chest pain. Respiratory: Denies shortness of breath. Gastrointestinal: No abdominal pain.  No nausea, no vomiting.  No diarrhea.  No constipation. Genitourinary: Negative for dysuria. Musculoskeletal: Negative for back pain. Skin: Negative for rash. Neurological: Negative for headaches, focal weakness   ____________________________________________   PHYSICAL EXAM:  VITAL SIGNS: ED Triage Vitals  Enc Vitals Group     BP 11/24/17 1130 (!) 143/89     Pulse Rate 11/24/17 1130 (!) 106     Resp 11/24/17 1130 16     Temp 11/24/17 1130 (!) 97.5 F (36.4 C)     Temp  Source 11/24/17 1130 Oral     SpO2 11/24/17 1130 100 %     Weight 11/24/17 1132 158 lb (71.7 kg)     Height 11/24/17 1132 5\' 3"  (1.6 m)     Head Circumference --      Peak Flow --      Pain Score 11/24/17 1132 2     Pain Loc --      Pain Edu? --      Excl. in GC? --     Constitutional: Alert and oriented. Well appearing and in no acute distress. Eyes: Conjunctivae are normal. PERRL. EOMI. Head: Atraumatic. Nose: No congestion/rhinnorhea. Mouth/Throat: Mucous membranes are moist.  Oropharynx non-erythematous. Neck: No stridor.  Cardiovascular:  Normal rate, regular rhythm. Grossly normal heart sounds.  Good peripheral circulation. Respiratory: Normal respiratory effort.  No retractions. Lungs CTAB. Gastrointestinal: Soft and nontender. No distention. No abdominal bruits. No CVA tenderness. Genitourinary:normal perineum and vagina there is a small amount of whitish discharge no odor. There is no cervical motion tenderness or masses.  *Musculoskeletal: No lower extremity tenderness nor edema.  No joint effusions. Neurologic:  Normal speech and language. No gross focal neurologic deficits are appreciated. No gait instability. Skin:  Skin is warm, dry and intact. No rash noted. Psychiatric: Mood and affect are normal. Speech and behavior are normal.  ____________________________________________   LABS (all labs ordered are listed, but only abnormal results are displayed)  Labs Reviewed  WET PREP, GENITAL - Abnormal; Notable for the following components:      Result Value   WBC, Wet Prep HPF POC RARE (*)    All other components within normal limits  URINALYSIS, ROUTINE W REFLEX MICROSCOPIC - Abnormal; Notable for the following components:   Color, Urine YELLOW (*)    APPearance CLOUDY (*)    All other components within normal limits  HCG, QUANTITATIVE, PREGNANCY - Abnormal; Notable for the following components:   hCG, Beta Chain, Quant, S 58,670 (*)    All other components within normal limits  POCT PREGNANCY, URINE - Abnormal; Notable for the following components:   Preg Test, Ur POSITIVE (*)    All other components within normal limits  CHLAMYDIA/NGC RT PCR ([facility] ONLY)  POC URINE PREG, ED   ____________________________________________  EKG   ____________________________________________  RADIOLOGY  ED MD interpretat  Official radiology report(s): No results found.  ____________________________________________   PROCEDURES  Procedure(s) performed:   Procedures  Critical Care performed:    ____________________________________________   INITIAL IMPRESSION / ASSESSMENT AND PLAN / ED COURSE  patient feeling well looks well I will let her go.  On reviewing the chart just after patient discharged I noticed that her initial blood pressure which I had not initially seen was elevated. I have called her and asked her to come back or see her doctor tomorrow for further evaluation for this elevated blood pressure. Again she did not have any abdominal pain when I saw her does not have any protein in her urine but I think the best thing would be to have her rechecked very soon        ____________________________________________   FINAL CLINICAL IMPRESSION(S) / ED DIAGNOSES  Final diagnoses:  Vaginal discharge     ED Discharge Orders    None       Note:  This document was prepared using Dragon voice recognition software and may include unintentional dictation errors.    Arnaldo Natal, MD 11/24/17 [phone]

## 2017-11-24 NOTE — ED Triage Notes (Signed)
Pt presents to ED via POV c/o clear and yellow vaginal discharge. [redacted] weeks pregnant. Denies bleeding. 3/10 lower abd cramping. Pt states she called her OB/GYN triage nurse who told pt to come in.

## 2017-11-26 ENCOUNTER — Telehealth: Payer: Self-pay

## 2017-11-26 NOTE — Telephone Encounter (Signed)
Adv pt okay to wait for appt tomorrwo to be seen.  Pt states she feels fine, doesn't see spots.

## 2017-11-26 NOTE — Telephone Encounter (Signed)
Pt was seen in ED for d/c.  However, her BP on intake was 143/89, 3hrs later at discharge it wa 127/72.  When she got home she had a msg from ED doc stating she should be seen for BP.  Has appt tomorrow, is it okay for her to wait and be seen then?  [phone]

## 2017-11-27 ENCOUNTER — Encounter: Payer: Self-pay | Admitting: Certified Nurse Midwife

## 2017-11-27 ENCOUNTER — Ambulatory Visit (INDEPENDENT_AMBULATORY_CARE_PROVIDER_SITE_OTHER): Payer: Managed Care, Other (non HMO) | Admitting: Certified Nurse Midwife

## 2017-11-27 VITALS — BP 150/80 | Wt 159.0 lb

## 2017-11-27 DIAGNOSIS — O0990 Supervision of high risk pregnancy, unspecified, unspecified trimester: Secondary | ICD-10-CM

## 2017-11-27 DIAGNOSIS — O10919 Unspecified pre-existing hypertension complicating pregnancy, unspecified trimester: Secondary | ICD-10-CM

## 2017-11-27 DIAGNOSIS — F419 Anxiety disorder, unspecified: Secondary | ICD-10-CM

## 2017-11-27 DIAGNOSIS — Z3A15 15 weeks gestation of pregnancy: Secondary | ICD-10-CM

## 2017-11-27 NOTE — Progress Notes (Signed)
Pt c/o feeling anxious, was in the hospital over the weekend due to discharge. Had elevated BP reading there she was concerned about.

## 2017-11-28 LAB — TSH: TSH: 0.878 u[IU]/mL (ref 0.450–4.500)

## 2017-11-29 ENCOUNTER — Encounter: Payer: Self-pay | Admitting: Certified Nurse Midwife

## 2017-11-29 DIAGNOSIS — O10919 Unspecified pre-existing hypertension complicating pregnancy, unspecified trimester: Secondary | ICD-10-CM | POA: Insufficient documentation

## 2017-11-29 DIAGNOSIS — F419 Anxiety disorder, unspecified: Secondary | ICD-10-CM | POA: Insufficient documentation

## 2017-11-29 NOTE — Addendum Note (Signed)
Addended by: [name], Gera Inboden on: 11/29/2017 09:16 PM   Modules accepted: Orders

## 2017-11-29 NOTE — Progress Notes (Signed)
HROB at 15wk1 day. Elevated blood pressures in first trimester and today 150/80. Has always had "white coat hypertension" and had gestational hypertension with G1 Was at the hospital over the WE for vaginal discharge and BP 143/89 Having some"visual floaters", no headaches, chest pain, RUQ pain, N/V Had an echo with her previous pregnancy that she thinks was normal. ALso has anxiety and is currently taking Zoloft 100 mgm daily. [place] R[place] 4 weeks for anatomy scan TSH today (had CMP and PC ratio (104) [facility]-for CHTN in pregnancy Farrel Connersolleen Pegge Cumberledge, [name]

## 2017-12-04 ENCOUNTER — Ambulatory Visit (INDEPENDENT_AMBULATORY_CARE_PROVIDER_SITE_OTHER): Payer: Managed Care, Other (non HMO) | Admitting: Maternal Newborn

## 2017-12-04 ENCOUNTER — Encounter: Payer: Self-pay | Admitting: Maternal Newborn

## 2017-12-04 VITALS — BP 130/100 | Wt 163.0 lb

## 2017-12-04 DIAGNOSIS — O0990 Supervision of high risk pregnancy, unspecified, unspecified trimester: Secondary | ICD-10-CM

## 2017-12-04 DIAGNOSIS — N898 Other specified noninflammatory disorders of vagina: Secondary | ICD-10-CM | POA: Diagnosis not present

## 2017-12-04 DIAGNOSIS — B373 Candidiasis of vulva and vagina: Secondary | ICD-10-CM

## 2017-12-04 DIAGNOSIS — B3731 Acute candidiasis of vulva and vagina: Secondary | ICD-10-CM

## 2017-12-04 LAB — POCT WET PREP (WET MOUNT): TRICHOMONAS WET PREP HPF POC: ABSENT

## 2017-12-04 MED ORDER — TERCONAZOLE 0.4 % VA CREA: 1.0000 | TOPICAL_CREAM | Freq: Every day | VAGINAL | 1 refills | Status: AC

## 2017-12-04 NOTE — Progress Notes (Signed)
C/o vaginal inf - saw pink spot yesterday, itchy, burns, off and on.rj

## 2017-12-04 NOTE — Progress Notes (Signed)
Prenatal Problem Visit  Subjective  [name] is a [age] y.o. G3P1011 at 2663w1d being seen today for ongoing prenatal care.  She is currently monitored for the following issues for this high-risk pregnancy and has Supervision of high risk pregnancy, antepartum; History of gestational hypertension; History of cesarean delivery, currently pregnant; Hiatal hernia; Mitral valve disorder; Palpitations; Scoliosis deformity of spine; Chronic hypertension during pregnancy, antepartum; and Anxiety on their problem list.  ----------------------------------------------------------------------------------- Patient reports vulvar and vaginal irritation. She has also had vulvar and vaginal itching. She has felt increasing irritation over the last few days and had some pinkish discharge when wiping. ----------------------------------------------------------------------------------- The following portions of the patient's history were reviewed and updated as appropriate: allergies, current medications, past family history, past medical history, past social history, past surgical history and problem list. Problem list updated.   Objective  Blood pressure (!) 130/100, weight 163 lb (73.9 kg), last menstrual period 08/13/2017, currently breastfeeding. Pregravid weight 168 lb (76.2 kg) Total Weight Gain  (-2.268 kg)   General:  Alert, oriented and cooperative. Patient is in no acute distress.  Skin: Skin is warm and dry. No rash noted.   Cardiovascular: Normal heart rate noted  Respiratory: Normal respiratory effort, no problems with respiration noted  Abdomen: Soft, gravid, appropriate for gestational age.       Pelvic:  Cervical exam deferred        Extremities: Normal range of motion.     Mental Status: Normal mood and affect. Normal behavior. Normal judgment and thought content.   Vulvar irritation present with several areas showing small abrasions, vaginal irritation also evident. Thick white  discharge present. Wet prep shows yeast.  Assessment   32 y.o. G3P1011 at 9163w1d, EDD 32/10/2017 by Last Menstrual Period presenting for work-in prenatal visit.  Plan   third Problems (from 09/25/17 to present)    Problem Noted Resolved   Supervision of high risk pregnancy, antepartum 09/25/2017 by [name], Hasaan Radde Y, [name] No   Overview Addendum 11/29/2017  9:09 PM by [name], Colleen, [name]    Clinic [facility] Prenatal Labs  Dating LMP Blood type: O/Positive/-- (01/08 1100)   Genetic Screen 1 Screen:    AFP:     Quad:     NIPS: Antibody:Negative (01/08 1100)  Anatomic US  Rubella: 1.45 (01/08 1100) Varicella:    GTT Early:               Third trimester:  RPR: Non Reactive (01/08 1100)   Rhogam  HBsAg: Negative (01/08 1100)   TDaP vaccine                       Flu Shot: HIV: Non Reactive (01/08 1100)   Baby Food                                GBS:   Contraception  Pap:  CBB     CS/VBAC    Support Person                 Rx terconazole for vulvovaginal candidiasis. Recommended probiotics and/or yogurt with live active cultures to help create healthy vaginal flora.  Repeat BP 140/84. She has been logging her blood pressures at home, highest have been 130s systolic and high 80s diastolic. [facility] tomorrow for cHTN.  Preterm labor symptoms and general obstetric precautions including but not limited to vaginal  bleeding, contractions, leaking of fluid and fetal movement were reviewed in detail with the patient.  Keep scheduled ROB.  [name], [name] 12/04/2017  9:15 AM

## 2017-12-05 ENCOUNTER — Telehealth: Payer: Self-pay

## 2017-12-05 NOTE — Telephone Encounter (Signed)
Pt saw JS yesterday & she forgot to ask a question she had. Pt inquiring when we advise not to sleep on your back any longer. She uses a pregnancy pillow & always starts on her side, but was in a deep sleep last night & woke up and is pretty sure she was on her back. AV#[phone]#[phone]. Ok to leave message on machine.

## 2017-12-06 NOTE — Telephone Encounter (Signed)
Spoke w/pt regarding reasoning for avoiding sleeping on back during pregnancy (2nd& 3rd Trimester). Due to pressure on main blood supply to & from heart & side lying is recommended to be best for blood flow to placenta/baby.

## 2017-12-10 ENCOUNTER — Ambulatory Visit
Admission: RE | Admit: 2017-12-10 | Discharge: 2017-12-10 | Disposition: A | Discharge status: Home / Self Care | Payer: Managed Care, Other (non HMO) | Source: Ambulatory Visit | Attending: Maternal & Fetal Medicine | Admitting: Maternal & Fetal Medicine

## 2017-12-10 DIAGNOSIS — O0990 Supervision of high risk pregnancy, unspecified, unspecified trimester: Secondary | ICD-10-CM

## 2017-12-10 NOTE — Progress Notes (Signed)
MFM Consultation  32 yo G3P2011 at [redacted] weeks gestation (EDD 05/20/18 by LMP c/w first trimester us) with h/o chronic hypertension--'all my life'--she reports hypertension diagnosed during medical office visits and associates it with her anxiety disorder.  She maintains a log of home BP checks and all are at or below 130/80. She developed worsening hypertension during the third trimester (neg protein) and was induced at [redacted] weeks gestation.   Her history is also significant for anxiety, as noted, and a 'heart murmur'--she reports normal Echo and Cardiology evaluation in her last pregnancy (2014).  She previously took SBE prophylaxis and was advised it was no longer necessary.   She also has a history of hiatal hernia and GIB in past (medical management). She avoids aspirin due to this history.     Past Medical History:  Diagnosis Date  . Anxiety   . Hiatal hernia   . 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 Palpitations   . Scoliosis   . Trouble in sleeping    OB History    Gravida  3   Para  1   Term  1   Preterm  0   AB  1   Living  1     SAB  1   TAB  0   Ectopic  0   Multiple  0   Live Births  1        Obstetric Comments  IOL for gestational hypertension      7lb 4 oz, female infant  Past Surgical History:  Procedure Laterality Date  . CESAREAN SECTION N/A 03/04/2013   Procedure: CESAREAN SECTION;  Surgeon: Bing Plumehomas F Henley, MD;  Location: [facility];  Service: Obstetrics;  Laterality: N/A;   Current Outpatient Medications on File Prior to Visit  Medication Sig Dispense Refill  . cetirizine (ZYRTEC) 10 MG tablet Take 10 mg by mouth daily.    . pantoprazole (PROTONIX) 20 MG tablet Take 1 tablet (20 mg total) by mouth daily. 30 tablet 2  . Prenatal Vit-Fe Fumarate-FA (PRENATAL MULTIVITAMIN) TABS Take 1 tablet by mouth daily.    . promethazine (PHENERGAN) 25 MG tablet Take 1 tablet (25 mg total) by mouth every 6 (six) hours as needed for nausea or vomiting. 30 tablet 2   . sertraline (ZOLOFT) 100 MG tablet TK 1 T PO D FOR ANXIETY  2  . terconazole (TERAZOL 7) 0.4 % vaginal cream Place 1 applicator vaginally at bedtime for 7 days. 45 g 1   No current facility-administered medications on file prior to visit.     Allergies  Allergen Reactions  . Amoxicillin Itching    Family History  Problem Relation Age of Onset  . Diabetes Mother   . Hypertension Mother   . Kidney disease Mother   . 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  No tobacco, etoh, ilicit drug use.   Lab Results  Component Value Date   WBC 14.0 (H) 11/11/2017   HGB 14.6 11/11/2017   HCT 44.3 11/11/2017   MCV 83.7 11/11/2017   PLT 283 11/11/2017   Lab Results  Component Value Date   ALT 10 11/12/2017   AST 11 11/12/2017   ALKPHOS 67 11/11/2017   BILITOT 0.4 11/11/2017   Lab Results  Component Value Date   CREATININE 0.57 11/11/2017   Lab Results  Component Value Date   TSH 0.878 11/27/2017   P;c ratio: 104 (per pn record)  Vitals:  12/10/17 0918  BP: (!) 129/93  Pulse: (!) 117  Resp: 18  Temp: 97.9 F (36.6 C)  SpO2: 100%      Hypertension, 'White Coat'  Chronic hypertension in pregnancy, may be associated with fetal growth restriction, abruption and preeclampsia.  She does not have evidence of end-organ damage that would increase her risk for early onset preeclampsia or FGR, abruption.  Poorly controlled chronic hypertension can increase the risk for these poor outcomes.  I recommended she continue to check her bps at home--she will check once in evening and will check 1-2 time during the day.   We would recommend the following surveillance and planning during pregnancy. She had normal baseline liver function, platelets, hct, and p:c ratio,  as above.  .  1. We discussed the use of low dose aspirin for preeclampsia risk reduction--She is unable to tolerate aspirin (previous history of GI bleed, taking PPI. 2. Recommend  fetal growth assessments at ~28 weeks and ~32-[redacted] weeks gestation 3. Weekly antenatal testing beginning at 34 weeks 4. Plan delivery in 39th week, unless medically indicated sooner (eg worsening bp control, preeclampsia, iugr)  Anxiety We reviewed lack of teratogenicity associated with sertraline.  Sertraline is associated with a slightly higher risk for transient tachypnea of the newborn. 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 She sees therapist.  --Close surveillance postpartum recommended due to increased risk for postpartum depression.

## 2017-12-12 ENCOUNTER — Telehealth: Payer: Self-pay

## 2017-12-12 ENCOUNTER — Other Ambulatory Visit: Payer: Self-pay | Admitting: Maternal Newborn

## 2017-12-12 DIAGNOSIS — B3731 Acute candidiasis of vulva and vagina: Secondary | ICD-10-CM

## 2017-12-12 DIAGNOSIS — B373 Candidiasis of vulva and vagina: Secondary | ICD-10-CM

## 2017-12-12 MED ORDER — TERCONAZOLE 0.4 % VA CREA: 1.0000 | TOPICAL_CREAM | Freq: Every day | VAGINAL | 1 refills | Status: DC

## 2017-12-12 NOTE — Telephone Encounter (Signed)
Refill sent, thanks!

## 2017-12-12 NOTE — Telephone Encounter (Signed)
Pt called triage line stating she has recently seen JS and was treated for yeast infection. She is feeling some better but still having symptoms of itchiness. Does she need to be seen or can a refill be called in?   KJ CMA

## 2017-12-12 NOTE — Telephone Encounter (Signed)
Pt calling again, wanting to speak to JS about Rx that was sent in. She needs direction and has questions.   Kim CMA (AAMA)

## 2017-12-19 ENCOUNTER — Telehealth: Payer: Self-pay

## 2017-12-19 ENCOUNTER — Other Ambulatory Visit: Payer: Self-pay | Admitting: Maternal Newborn

## 2017-12-19 DIAGNOSIS — O219 Vomiting of pregnancy, unspecified: Secondary | ICD-10-CM

## 2017-12-19 DIAGNOSIS — N760 Acute vaginitis: Secondary | ICD-10-CM

## 2017-12-19 MED ORDER — BUTOCONAZOLE NITRATE (1 DOSE) 2 % VA CREA: 1.0000 "application " | TOPICAL_CREAM | Freq: Once | VAGINAL | 0 refills | Status: AC

## 2017-12-19 MED ORDER — NYSTATIN 100000 UNIT/GM EX CREA: 1.0000 "application " | TOPICAL_CREAM | Freq: Two times a day (BID) | CUTANEOUS | 1 refills | Status: DC

## 2017-12-19 NOTE — Telephone Encounter (Signed)
Pt calling for Maryruth EveJaci Schmid. Pt has completed 2 weeks of terconazole for yeast infection and finished last night but still has complaints if itching, burning and redness in vaginal area. Per pt was told by [place] BingJaci to call if symptoms had not resolved and was aware pt would be in [place] 2 weeks and possibly call in rx there. Pt also stated she requested refill for promethazine. She did not provide an updated pharmacy.   Cb# [phone] thank you.

## 2017-12-20 NOTE — Telephone Encounter (Signed)
Jaci prescribed nystatin cream and [place].

## 2018-01-01 ENCOUNTER — Ambulatory Visit (INDEPENDENT_AMBULATORY_CARE_PROVIDER_SITE_OTHER): Payer: Managed Care, Other (non HMO)

## 2018-01-01 ENCOUNTER — Encounter: Payer: Self-pay | Admitting: Maternal Newborn

## 2018-01-01 ENCOUNTER — Ambulatory Visit (INDEPENDENT_AMBULATORY_CARE_PROVIDER_SITE_OTHER): Payer: Managed Care, Other (non HMO) | Admitting: Maternal Newborn

## 2018-01-01 VITALS — BP 140/80 | Wt 175.0 lb

## 2018-01-01 DIAGNOSIS — Z3A20 20 weeks gestation of pregnancy: Secondary | ICD-10-CM

## 2018-01-01 DIAGNOSIS — O10919 Unspecified pre-existing hypertension complicating pregnancy, unspecified trimester: Secondary | ICD-10-CM | POA: Diagnosis not present

## 2018-01-01 DIAGNOSIS — O0990 Supervision of high risk pregnancy, unspecified, unspecified trimester: Secondary | ICD-10-CM

## 2018-01-01 DIAGNOSIS — N898 Other specified noninflammatory disorders of vagina: Secondary | ICD-10-CM

## 2018-01-01 NOTE — Progress Notes (Signed)
Routine Prenatal Care Visit  Subjective  [name] is a 32 y.o. G3P1011 at 2436w1d being seen today for ongoing prenatal care.  She is currently monitored for the following issues for this high-risk pregnancy and has Supervision of high risk pregnancy, antepartum; History of gestational hypertension; History of cesarean delivery, currently pregnant; Hiatal hernia; Mitral valve disorder; Palpitations; Scoliosis deformity of spine; Chronic hypertension during pregnancy, antepartum; and Anxiety on their problem list.  ----------------------------------------------------------------------------------- Patient reports recurrent yeast infections and vaginal irritation with occasional pink spotting on wiping.   Contractions: Not present. Vag. Bleeding: None.  Movement: Present. Denies leaking of fluid.  ----------------------------------------------------------------------------------- The following portions of the patient's history were reviewed and updated as appropriate: allergies, current medications, past family history, past medical history, past social history, past surgical history and problem list. Problem list updated.   Objective  Blood pressure 140/80, weight 175 lb (79.4 kg), last menstrual period 08/13/2017, currently breastfeeding. Pregravid weight 168 lb (76.2 kg) Total Weight Gain 7 lb (3.175 kg) Urinalysis: Urine Protein: Negative Urine Glucose: Negative  Fetal Status: Fetal Heart Rate (bpm): 135   Movement: Present     General:  Alert, oriented and cooperative. Patient is in no acute distress.  Skin: Skin is warm and dry. No rash noted.   Cardiovascular: Normal heart rate noted  Respiratory: Normal respiratory effort, no problems with respiration noted  Abdomen: Soft, gravid, appropriate for gestational age. Pain/Pressure: Absent     Pelvic:  Cervical exam deferred        Extremities: Normal range of motion.     Mental Status: Normal mood and affect. Normal behavior.  Normal judgment and thought content.     Assessment   32 y.o. G3P1011 at 5236w1d, EDD 05/20/2018 by Last Menstrual Period presenting for routine prenatal visit.  Plan   third Problems (from 09/25/17 to present)    Problem Noted Resolved   Supervision of high risk pregnancy, antepartum 09/25/2017 by [name], [name], [name] No   Overview Addendum 11/29/2017  9:09 PM by [name], Colleen, [name]    Clinic [facility] Prenatal Labs  Dating LMP Blood type: O/Positive/-- (01/08 1100)   Genetic Screen 1 Screen:    AFP:     Quad:     NIPS: Antibody:Negative (01/08 1100)  Anatomic US  Rubella: 1.45 (01/08 1100) Varicella:    GTT Early:               Third trimester:  RPR: Non Reactive (01/08 1100)   Rhogam  HBsAg: Negative (01/08 1100)   TDaP vaccine                       Flu Shot: HIV: Non Reactive (01/08 1100)   Baby Food                                GBS:   Contraception  Pap:  CBB     CS/VBAC    Support Person               Anatomy scan complete and normal today. NuSwab sent today for vulvar irritation and recurrent yeast infections.  Re[facility]ations [facility] for management of cHTN/white coat hypertension (copied from Dr. Catha GosselinSmall's note on 12/10/17):  "1. We discussed the use of low dose aspirin for preeclampsia risk reduction--She is unable to tolerate aspirin (previous history of GI bleed, taking PPI.) 2. Recommend fetal growth assessments at ~  28 weeks and ~32-[redacted] weeks gestation 3. Weekly antenatal testing beginning at 34 weeks 4. Plan delivery in 39th week, unless medically indicated sooner (eg worsening bp control, preeclampsia, iugr)"  Preterm labor symptoms and general obstetric precautions including but not limited to vaginal bleeding, contractions, leaking of fluid and fetal movement were reviewed.  Return in about 1 month (around 01/29/2018) for ROB.  [name], [name] 01/01/2018  4:31 PM

## 2018-01-01 NOTE — Progress Notes (Signed)
C/o last night at home BP was 110/70; thinks yeast inf is back.rj

## 2018-01-02 ENCOUNTER — Telehealth: Payer: Self-pay

## 2018-01-02 NOTE — Telephone Encounter (Signed)
Pt calling today requesting to speak with JS regarding "c-shaped" placenta. She has some questions she forgot to ask her yesterday.

## 2018-01-04 LAB — NUSWAB VG+, CANDIDA 6SP
CANDIDA KRUSEI, NAA: NEGATIVE
CANDIDA PARAPSILOSIS, NAA: NEGATIVE
CANDIDA TROPICALIS, NAA: NEGATIVE
Candida albicans, NAA: NEGATIVE
Candida glabrata, NAA: NEGATIVE
Candida lusitaniae, NAA: NEGATIVE
Chlamydia trachomatis, NAA: NEGATIVE
Neisseria gonorrhoeae, NAA: NEGATIVE
TRICH VAG BY NAA: NEGATIVE

## 2018-01-10 ENCOUNTER — Telehealth: Payer: Self-pay

## 2018-01-10 ENCOUNTER — Ambulatory Visit (INDEPENDENT_AMBULATORY_CARE_PROVIDER_SITE_OTHER): Payer: Managed Care, Other (non HMO)

## 2018-01-10 ENCOUNTER — Other Ambulatory Visit: Payer: Self-pay | Admitting: Maternal Newborn

## 2018-01-10 ENCOUNTER — Telehealth: Payer: Self-pay | Admitting: Maternal Newborn

## 2018-01-10 DIAGNOSIS — O26892 Other specified pregnancy related conditions, second trimester: Secondary | ICD-10-CM

## 2018-01-10 DIAGNOSIS — R3989 Other symptoms and signs involving the genitourinary system: Secondary | ICD-10-CM

## 2018-01-10 DIAGNOSIS — R300 Dysuria: Secondary | ICD-10-CM

## 2018-01-10 LAB — POCT URINALYSIS DIPSTICK
Bilirubin, UA: NEGATIVE
Glucose, UA: NEGATIVE
Nitrite, UA: NEGATIVE
PH UA: 7.5 (ref 5.0–8.0)
SPEC GRAV UA: 1.015 (ref 1.010–1.025)
UROBILINOGEN UA: NEGATIVE U/dL — AB

## 2018-01-10 MED ORDER — CEPHALEXIN 500 MG PO CAPS: 500.0000 mg | ORAL_CAPSULE | Freq: Two times a day (BID) | ORAL | 0 refills | Status: DC

## 2018-01-10 NOTE — Progress Notes (Signed)
Patient to come for UA and urine culture today; tx will be based on results.  Marcelyn BruinsJacelyn Schmid, [name] 01/10/2018  10:16 AM

## 2018-01-10 NOTE — Progress Notes (Signed)
Patient to call if she experiences itching with Keflex and will send another Rx.  Marcelyn BruinsJacelyn Belmont Valli, [name] 01/10/2018  12:54 PM

## 2018-01-10 NOTE — Telephone Encounter (Signed)
Patient is returning missed call for results. Please advise  

## 2018-01-10 NOTE — Addendum Note (Signed)
Addended by: Ova FreshwaterWILSON, Vadis Slabach R on: 01/10/2018 11:14 AM   Modules accepted: Orders

## 2018-01-10 NOTE — Telephone Encounter (Signed)
Pt called stating she has been having bladder pressure, burning when finishes urinating. Lots of irritation. Pt states Jacelyn called her on Monday stating she was negative for yeast. But is concerned it may be a UTI.   I looked and there are no openings on anyone's schedule today. Please advise CB# [phone]

## 2018-01-10 NOTE — Telephone Encounter (Signed)
Rx sent for antibiotics based on UA results. Will call to change tx if urine culture results require change in therapy. She is aware to pick up medication and start treatment.  Marcelyn BruinsJacelyn Colandra Ohanian, [name] 01/10/2018  12:52 PM

## 2018-01-11 ENCOUNTER — Other Ambulatory Visit: Payer: Self-pay | Admitting: Maternal Newborn

## 2018-01-11 DIAGNOSIS — O2340 Unspecified infection of urinary tract in pregnancy, unspecified trimester: Secondary | ICD-10-CM

## 2018-01-11 MED ORDER — SULFAMETHOXAZOLE-TRIMETHOPRIM 800-160 MG PO TABS: 1.0000 | ORAL_TABLET | Freq: Two times a day (BID) | ORAL | 0 refills | Status: AC

## 2018-01-11 NOTE — Progress Notes (Signed)
Changed Rx to Bactrim, patient began itching after one dose of Keflex.  Marcelyn BruinsJacelyn [name], [name] 01/11/2018  8:52 AM

## 2018-01-11 NOTE — Telephone Encounter (Signed)
Pt tried the 1 dose of ABX rx'd yesterday around 6pm & around 3am she woke up itching. [phone]

## 2018-01-12 LAB — URINE CULTURE

## 2018-01-15 ENCOUNTER — Other Ambulatory Visit: Payer: Self-pay | Admitting: Maternal Newborn

## 2018-01-15 ENCOUNTER — Telehealth: Payer: Self-pay

## 2018-01-15 ENCOUNTER — Ambulatory Visit: Payer: Managed Care, Other (non HMO)

## 2018-01-15 ENCOUNTER — Ambulatory Visit (INDEPENDENT_AMBULATORY_CARE_PROVIDER_SITE_OTHER): Payer: Managed Care, Other (non HMO) | Admitting: Maternal Newborn

## 2018-01-15 VITALS — BP 130/92 | Wt 172.0 lb

## 2018-01-15 DIAGNOSIS — R300 Dysuria: Secondary | ICD-10-CM

## 2018-01-15 DIAGNOSIS — O26892 Other specified pregnancy related conditions, second trimester: Secondary | ICD-10-CM

## 2018-01-15 DIAGNOSIS — O0990 Supervision of high risk pregnancy, unspecified, unspecified trimester: Secondary | ICD-10-CM

## 2018-01-15 DIAGNOSIS — O219 Vomiting of pregnancy, unspecified: Secondary | ICD-10-CM

## 2018-01-15 DIAGNOSIS — N898 Other specified noninflammatory disorders of vagina: Secondary | ICD-10-CM

## 2018-01-15 LAB — POCT URINALYSIS DIPSTICK
Bilirubin, UA: NEGATIVE
Blood, UA: NEGATIVE
Glucose, UA: NEGATIVE
Ketones, UA: NEGATIVE
Leukocytes, UA: NEGATIVE
NITRITE UA: NEGATIVE
Urobilinogen, UA: NEGATIVE E.U./dL — AB
pH, UA: 5 (ref 5.0–8.0)

## 2018-01-15 LAB — POCT WET PREP (WET MOUNT)
Clue Cells Wet Prep Whiff POC: NEGATIVE
Trichomonas Wet Prep HPF POC: ABSENT

## 2018-01-15 MED ORDER — NITROFURANTOIN MONOHYD MACRO 100 MG PO CAPS: 100.0000 mg | ORAL_CAPSULE | Freq: Two times a day (BID) | ORAL | 0 refills | Status: AC

## 2018-01-15 MED ORDER — METRONIDAZOLE 0.75 % VA GEL: 1.0000 | Freq: Every day | VAGINAL | 0 refills | Status: AC

## 2018-01-15 MED ORDER — PROMETHAZINE HCL 25 MG PO TABS: ORAL_TABLET | ORAL | 0 refills | Status: DC

## 2018-01-15 NOTE — Progress Notes (Signed)
C/o burning c urination, itchy, frequency, skin itching all over - also skin is dry.rj

## 2018-01-15 NOTE — Progress Notes (Signed)
Still having UTI symptoms and last culture did not grow out any pathogens. Return today for repeat urine culture.  [name], [name] 01/15/2018  11:49 AM

## 2018-01-15 NOTE — Telephone Encounter (Signed)
Pt states she has been working with [facility] [facility] and waiting for urine culture results to see if abx give would be effective. Pt is now on day 5 of abx and still not feeling well, c/o burning with urination, pressure and frequency. Pt also c/o itching all over and skin very dry, she is unable to sleep at night and only sleeping about 3 hours. Pt is concerned and anxious. Please advise. Cb# [phone]. Thank you.

## 2018-01-16 ENCOUNTER — Encounter: Payer: Self-pay | Admitting: Maternal Newborn

## 2018-01-16 NOTE — Progress Notes (Signed)
Prenatal Problem Visit  Subjective  [name] is a 32 y.o. G3P1011 at [redacted]w[redacted]d being seen today for ongoing prenatal care.  She is currently monitored for the following issues for this high-risk pregnancy and has Supervision of high risk pregnancy, antepartum; History of gestational hypertension; History of cesarean delivery, currently pregnant; Hiatal hernia; Mitral valve disorder; Palpitations; Scoliosis deformity of spine; Chronic hypertension during pregnancy, antepartum; and Anxiety on their problem list.  ----------------------------------------------------------------------------------- Patient reports dysuria and urinary frequency.   She has had these symptoms since last week and has completed 5 days of therapy with Bactrim. Urine culture returned negative. She has ongoing vaginal irritation and discharge with NuSwab results previously negative x 2. Also recently feels itchy and has difficulty sleeping; has ongoing nausea. Contractions: Not present. Vag. Bleeding: None.  Movement: Present. Denies leaking of fluid.  ----------------------------------------------------------------------------------- The following portions of the patient's history were reviewed and updated as appropriate: allergies, current medications, past family history, past medical history, past social history, past surgical history and problem list. Problem list updated.   Objective  Blood pressure (!) 130/92, weight 172 lb (78 kg), last menstrual period 08/13/2017, currently breastfeeding. Pregravid weight 168 lb (76.2 kg) Total Weight Gain 4 lb (1.814 kg) Urinalysis: Urine Protein: Negative Urine Glucose: Negative  Fetal Status: Fetal Heart Rate (bpm): 130   Movement: Present     General:  Alert, oriented and cooperative. Patient is in no acute distress.  Skin: Skin is warm and dry. No rash noted.   Cardiovascular: Normal heart rate noted  Respiratory: Normal respiratory effort, no problems with respiration  noted  Abdomen: Soft, gravid, appropriate for gestational age. Pain/Pressure: Absent     Pelvic:  Cervical exam deferred      External genitalia with signs of irritation and moderate amount of white discharge.  Extremities: Normal range of motion.     Mental Status: Normal mood and affect. Normal behavior. Normal judgment and thought content.   Wet prep negative for yeast, clue cells, trichomonads. Nitrazine negative.  Assessment   32 y.o. G3P1011 at [redacted]w[redacted]d, EDD 05/20/2018 by Last Menstrual Period presenting for a prenatal problem visit.  Plan   third Problems (from 09/25/17 to present)    Problem Noted Resolved   Supervision of high risk pregnancy, antepartum 09/25/2017 by Oswaldo Conroy, [name] No   Overview Addendum 11/29/2017  9:09 PM by Farrel Conners, [name]    [facility]inic [facility] Prenatal Labs  Dating LMP Blood type: O/Positive/-- (01/08 1100)   Genetic Screen 1 Screen:    AFP:     Quad:     NIPS: Antibody:Negative (01/08 1100)  Anatomic [place]  Rubella: 1.45 (01/08 1100) Varicella:    GTT Early:               Third trimester:  RPR: Non Reactive (01/08 1100)   Rhogam  HBsAg: Negative (01/08 1100)   TDaP vaccine                       Flu Shot: HIV: Non Reactive (01/08 1100)   Baby Food                                GBS:   Contraception  Pap:  CBB     CS/VBAC    Support Person               Repeated urine culture and NuSwab as she  is symptomatic. Started Macrobid. After consultation with MD, Rx for metronidazole gel for vaginal irritation to see if it improves as multiple courses of terconazole have not resolved symptoms.  Renewed Rx for promethazine at patient's request due to ongoing nausea.  Keep previously scheduled ROB appointment.  [name], [name] 01/16/2018  5:00 PM

## 2018-01-16 NOTE — Addendum Note (Signed)
Addended by: [name] on: 01/16/2018 05:08 PM   Modules accepted: Orders

## 2018-01-17 LAB — URINE CULTURE: Organism ID, Bacteria: NO GROWTH

## 2018-01-18 LAB — NUSWAB VAGINITIS PLUS (VG+)
CANDIDA GLABRATA, NAA: NEGATIVE
CHLAMYDIA TRACHOMATIS, NAA: NEGATIVE
Candida albicans, NAA: NEGATIVE
Neisseria gonorrhoeae, NAA: NEGATIVE
TRICH VAG BY NAA: NEGATIVE

## 2018-01-29 ENCOUNTER — Ambulatory Visit (INDEPENDENT_AMBULATORY_CARE_PROVIDER_SITE_OTHER): Payer: Managed Care, Other (non HMO) | Admitting: Maternal Newborn

## 2018-01-29 ENCOUNTER — Encounter: Payer: Self-pay | Admitting: Maternal Newborn

## 2018-01-29 VITALS — BP 140/90 | Wt 181.0 lb

## 2018-01-29 DIAGNOSIS — Z3A24 24 weeks gestation of pregnancy: Secondary | ICD-10-CM

## 2018-01-29 DIAGNOSIS — O0990 Supervision of high risk pregnancy, unspecified, unspecified trimester: Secondary | ICD-10-CM

## 2018-01-29 DIAGNOSIS — Z3689 Encounter for other specified antenatal screening: Secondary | ICD-10-CM

## 2018-01-29 NOTE — Progress Notes (Signed)
Routine Prenatal Care Visit  Subjective  [name] is a 32 y.o. G3P1011 at [redacted]w[redacted]d being seen today for ongoing prenatal care.  She is currently monitored for the following issues for this high-risk pregnancy and has Supervision of high risk pregnancy, antepartum; History of gestational hypertension; History of cesarean delivery, currently pregnant; Hiatal hernia; Mitral valve disorder; Palpitations; Scoliosis deformity of spine; Chronic hypertension during pregnancy, antepartum; and Anxiety on their problem list.  ----------------------------------------------------------------------------------- Patient reports vaginal discharge.   Contractions: Not present. Vag. Bleeding: None.  Movement: Present. Denies leaking of fluid.  ----------------------------------------------------------------------------------- The following portions of the patient's history were reviewed and updated as appropriate: allergies, current medications, past family history, past medical history, past social history, past surgical history and problem list. Problem list updated.   Objective   Pregravid weight 168 lb (76.2 kg) Total Weight Gain 13 lb (5.897 kg) Urinalysis: Urine Protein: Negative Urine Glucose: Negative  Fetal Status: Fetal Heart Rate (bpm): 140 Fundal Height: 24 cm Movement: Present     General:  Alert, oriented and cooperative. Patient is in no acute distress.  Skin: Skin is warm and dry. No rash noted.   Cardiovascular: Normal heart rate noted  Respiratory: Normal respiratory effort, no problems with respiration noted  Abdomen: Soft, gravid, appropriate for gestational age. Pain/Pressure: Absent     Pelvic:  Cervical exam deferred, clear discharge noted at introitus, no vulvar/ vaginal irritation noted        Extremities: Normal range of motion.     Mental Status: Normal mood and affect. Normal behavior. Normal judgment and thought content.     Assessment   32 y.o. G3P1011 at [redacted]w[redacted]d, EDD  05/20/2018 by Last Menstrual Period presenting for routine prenatal visit.  Plan   third Problems (from 09/25/17 to present)    Problem Noted Resolved   Supervision of high risk pregnancy, antepartum 09/25/2017 by Oswaldo Conroy, [name] No   Overview Addendum 11/29/2017  9:09 PM by Farrel Conners, [name]    [facility] Prenatal Labs  Dating LMP Blood type: O/Positive/-- (01/08 1100)   Genetic Screen 1 Screen:    AFP:     Quad:     NIPS: Antibody:Negative (01/08 1100)  Anatomic [place]  Rubella: 1.45 (01/08 1100) Varicella:    GTT Early:               Third trimester:  RPR: Non Reactive (01/08 1100)   Rhogam  HBsAg: Negative (01/08 1100)   TDaP vaccine                       Flu Shot: HIV: Non Reactive (01/08 1100)   Baby Food                                GBS:   Contraception  Pap:  CBB     CS/VBAC    Support Person               All symptoms of UTI have improved, still has vaginal discharge but it is clear/thin and no signs of infection. Appears to be normal physiologic discharge.  Continues to take blood pressures at home; she has been normotensive at 107-136 over 70s to 80s. Plan growth ultrasound next visit per DP recommendations for "white coat hypertension." Also GTT/28 week labs next time.  Gestational age appropriate obstetric precautions were reviewed.  Return in about 1 month (around 02/26/2018)  for ROB with GTT/28 week labs.  [name], [name] 01/30/2018  8:57 AM

## 2018-01-29 NOTE — Progress Notes (Signed)
C/o pt requests BP to be taken at the end of visit instead of beginning.  Pt states Bps have been normal at home.  Having a lot of d/c. rj

## 2018-02-12 ENCOUNTER — Other Ambulatory Visit: Payer: Self-pay | Admitting: Maternal Newborn

## 2018-02-12 DIAGNOSIS — O219 Vomiting of pregnancy, unspecified: Secondary | ICD-10-CM

## 2018-02-15 ENCOUNTER — Ambulatory Visit (INDEPENDENT_AMBULATORY_CARE_PROVIDER_SITE_OTHER): Payer: Managed Care, Other (non HMO) | Admitting: Obstetrics & Gynecology

## 2018-02-15 VITALS — BP 138/90 | Wt 184.0 lb

## 2018-02-15 DIAGNOSIS — N898 Other specified noninflammatory disorders of vagina: Secondary | ICD-10-CM | POA: Diagnosis not present

## 2018-02-15 NOTE — Progress Notes (Signed)
  HPI:      [name] is a 32 y.o. G3P1011 who LMP was Patient's last menstrual period was 08/13/2017., presents today for a problem visit.  She complains of:  Vaginitis: Patient complains of an abnormal vaginal discharge for a few days. Vaginal symptoms include discharge described as white.Vulvar symptoms include none.STI Risk: Very low risk of STD exposureDischarge described as: white.Other associated symptoms: none.Menstrual pattern: She had been bleeding w occas spotting this pregnancy; now 26 weeks. Contraception: none  PMHx: She  has a past medical history of Anxiety, Hiatal hernia, Mitral valve disorder in pregnancy, Palpitations, Scoliosis, and Trouble in sleeping. Also,  has a past surgical history that includes Cesarean section (N/A, 03/04/2013)., family history includes Diabetes in her mother; Hypertension in her father and mother; Kidney disease in her mother.,  reports that she has never smoked. She has never used smokeless tobacco. She reports that she does not drink alcohol or use drugs.  She has a current medication list which includes the following prescription(s): cetirizine, esomeprazole, nystatin cream, pantoprazole, prenatal multivitamin, promethazine, sertraline, and terconazole. Also, is allergic to amoxicillin and keflex [cephalexin].  Review of Systems  Constitutional: Negative for chills, fever and malaise/fatigue.  HENT: Negative for congestion, sinus pain and sore throat.   Eyes: Negative for blurred vision and pain.  Respiratory: Negative for cough and wheezing.   Cardiovascular: Negative for chest pain and leg swelling.  Gastrointestinal: Negative for abdominal pain, constipation, diarrhea, heartburn, nausea and vomiting.  Genitourinary: Negative for dysuria, frequency, hematuria and urgency.  Musculoskeletal: Negative for back pain, joint pain, myalgias and neck pain.  Skin: Negative for itching and rash.  Neurological: Negative for dizziness, tremors and  weakness.  Endo/Heme/Allergies: Does not bruise/bleed easily.  Psychiatric/Behavioral: Negative for depression. The patient is not nervous/anxious and does not have insomnia.     Objective: BP 138/90   Wt 184 lb (83.5 kg)   LMP 08/13/2017   BMI 32.59 kg/m  Physical Exam  Constitutional: She is oriented to person, place, and time. She appears well-developed and well-nourished. No distress.  Genitourinary: Vagina normal and uterus normal. Pelvic exam was performed with patient supine. There is no rash, tenderness or lesion on the right labia. There is no rash, tenderness or lesion on the left labia. No erythema or bleeding in the vagina. Right adnexum does not display mass and does not display tenderness. Left adnexum does not display mass and does not display tenderness. Cervix does not exhibit motion tenderness, discharge, polyp or nabothian cyst.   Uterus is mobile and midaxial. Uterus is not enlarged or exhibiting a mass.  Genitourinary Comments: Gravid uterus  HENT:  Head: Normocephalic and atraumatic.  Nose: Nose normal.  Mouth/Throat: Oropharynx is clear and moist.  Abdominal: Soft. She exhibits no distension. There is no tenderness.  Musculoskeletal: Normal range of motion.  Neurological: She is alert and oriented to person, place, and time. No cranial nerve deficit.  Skin: Skin is warm and dry.  Psychiatric: She has a normal mood and affect.   WET PREP:   no pathogens Findings are consistent with no pathogens identified causing these symptoms.  ASSESSMENT/PLAN:   Visit Diagnoses    Vaginal discharge    -  Primary    Will send culture, may be physiologic BV increases risk for PTL; monitor for these sx's  Annamarie Major, MD, [facility], [facility] 02/15/2018  11:50 AM

## 2018-02-18 LAB — NUSWAB BV AND CANDIDA, NAA
CANDIDA ALBICANS, NAA: NEGATIVE
Candida glabrata, NAA: NEGATIVE

## 2018-02-18 NOTE — Progress Notes (Signed)
Called pt someone answered no one said anything then hung up, ill try again later

## 2018-02-18 NOTE — Progress Notes (Signed)
Let her know that cultures were negative and this is good.

## 2018-02-19 NOTE — Progress Notes (Signed)
Pt aware.

## 2018-03-04 ENCOUNTER — Encounter: Payer: Self-pay | Admitting: Maternal Newborn

## 2018-03-04 ENCOUNTER — Ambulatory Visit (INDEPENDENT_AMBULATORY_CARE_PROVIDER_SITE_OTHER): Payer: Managed Care, Other (non HMO)

## 2018-03-04 ENCOUNTER — Ambulatory Visit (INDEPENDENT_AMBULATORY_CARE_PROVIDER_SITE_OTHER): Payer: Managed Care, Other (non HMO) | Admitting: Maternal Newborn

## 2018-03-04 ENCOUNTER — Other Ambulatory Visit: Payer: Managed Care, Other (non HMO)

## 2018-03-04 VITALS — BP 130/98 | Wt 181.0 lb

## 2018-03-04 DIAGNOSIS — O0990 Supervision of high risk pregnancy, unspecified, unspecified trimester: Secondary | ICD-10-CM

## 2018-03-04 DIAGNOSIS — Z3A29 29 weeks gestation of pregnancy: Secondary | ICD-10-CM

## 2018-03-04 DIAGNOSIS — O34219 Maternal care for unspecified type scar from previous cesarean delivery: Secondary | ICD-10-CM

## 2018-03-04 DIAGNOSIS — O10913 Unspecified pre-existing hypertension complicating pregnancy, third trimester: Secondary | ICD-10-CM

## 2018-03-04 DIAGNOSIS — O10919 Unspecified pre-existing hypertension complicating pregnancy, unspecified trimester: Secondary | ICD-10-CM

## 2018-03-04 DIAGNOSIS — Z3689 Encounter for other specified antenatal screening: Secondary | ICD-10-CM

## 2018-03-04 DIAGNOSIS — O09293 Supervision of pregnancy with other poor reproductive or obstetric history, third trimester: Secondary | ICD-10-CM

## 2018-03-04 NOTE — Progress Notes (Signed)
    Routine Prenatal Care Visit  Subjective  [name] is a 32 y.o. G3P1011 at 4358w0d being seen today for ongoing prenatal care.  She is currently monitored for the following issues for this high-risk pregnancy and has Supervision of high risk pregnancy, antepartum; History of gestational hypertension; History of cesarean delivery, currently pregnant; Hiatal hernia; Mitral valve disorder; Palpitations; Scoliosis deformity of spine; Chronic hypertension during pregnancy, antepartum; and Anxiety on their problem list.  ----------------------------------------------------------------------------------- Patient reports mostly normal blood pressure readings with home monitoring, one reading with systolic BP in 150s after climbing stairs. Contractions: Not present. Vag. Bleeding: None.  Movement: Present. No leaking of fluid.  ----------------------------------------------------------------------------------- The following portions of the patient's history were reviewed and updated as appropriate: allergies, current medications, past family history, past medical history, past social history, past surgical history and problem list. Problem list updated.   Objective  Blood pressure (!) 130/98, weight 181 lb (82.1 kg), last menstrual period 08/13/2017, currently breastfeeding. Pregravid weight 168 lb (76.2 kg) Total Weight Gain 13 lb (5.897 kg) Urinalysis: Urine Protein: Negative Urine Glucose: Negative  Fetal Status: Fetal Heart Rate (bpm): 142 Fundal Height: 30 cm Movement: Present     General:  Alert, oriented and cooperative. Patient is in no acute distress.  Skin: Skin is warm and dry. No rash noted.   Cardiovascular: Normal heart rate noted  Respiratory: Normal respiratory effort, no problems with respiration noted  Abdomen: Soft, gravid, appropriate for gestational age. Pain/Pressure: Absent     Pelvic:  Cervical exam deferred        Extremities: Normal range of motion.     Mental  Status: Normal mood and affect. Normal behavior. Normal judgment and thought content.     Assessment   32 y.o. G3P1011 at 7258w0d, EDD 05/20/2018 by Last Menstrual Period presenting for routine prenatal visit.  Plan   third Problems (from 09/25/17 to present)    Problem Noted Resolved   Supervision of high risk pregnancy, antepartum 09/25/2017 by [name], Abas Leicht Y, [name] No   Overview Addendum 11/29/2017  9:09 PM by [name], Colleen, [name]    [facility] Prenatal Labs  Dating LMP Blood type: O/Positive/-- (01/08 1100)   Genetic Screen 1 Screen:    AFP:     Quad:     NIPS: Antibody:Negative (01/08 1100)  Anatomic US  Rubella: 1.45 (01/08 1100) Varicella:    GTT Early:               Third trimester:  RPR: Non Reactive (01/08 1100)   Rhogam  HBsAg: Negative (01/08 1100)   TDaP vaccine                       Flu Shot: HIV: Non Reactive (01/08 1100)   Baby Food                                GBS:   Contraception  Pap:  CBB     CS/VBAC    Support Person               Growth ultrasound today with EFW 3 lb, 1 oz, 57th percentile, AFI 23.72 cm, breech presentation.  Preterm labor symptoms and general obstetric precautions including but not limited to vaginal bleeding, contractions, leaking of fluid and fetal movement were reviewed.   Return in about 2 weeks (around 03/18/2018) for ROB.  [name], [name] 03/04/2018

## 2018-03-04 NOTE — Progress Notes (Signed)
C/o Bps at home have been kind of wonky.

## 2018-03-05 LAB — 28 WEEK RH+PANEL
BASOS ABS: 0 10*3/uL (ref 0.0–0.2)
Basos: 0 %
EOS (ABSOLUTE): 0 10*3/uL (ref 0.0–0.4)
Eos: 0 %
GESTATIONAL DIABETES SCREEN: 110 mg/dL (ref 65–139)
HEMATOCRIT: 30.3 % — AB (ref 34.0–46.6)
HIV Screen 4th Generation wRfx: NONREACTIVE
Hemoglobin: 9.8 g/dL — ABNORMAL LOW (ref 11.1–15.9)
Immature Grans (Abs): 0.1 10*3/uL (ref 0.0–0.1)
Immature Granulocytes: 1 %
LYMPHS ABS: 1.4 10*3/uL (ref 0.7–3.1)
Lymphs: 9 %
MCH: 26.8 pg (ref 26.6–33.0)
MCHC: 32.3 g/dL (ref 31.5–35.7)
MCV: 83 fL (ref 79–97)
Monocytes Absolute: 0.9 10*3/uL (ref 0.1–0.9)
Monocytes: 6 %
Neutrophils Absolute: 13.1 10*3/uL — ABNORMAL HIGH (ref 1.4–7.0)
Neutrophils: 84 %
PLATELETS: 183 10*3/uL (ref 150–450)
RBC: 3.65 x10E6/uL — ABNORMAL LOW (ref 3.77–5.28)
RDW: 14.5 % (ref 12.3–15.4)
RPR: NONREACTIVE
WBC: 15.5 10*3/uL — ABNORMAL HIGH (ref 3.4–10.8)

## 2018-03-06 ENCOUNTER — Encounter: Payer: Self-pay | Admitting: Maternal Newborn

## 2018-03-13 ENCOUNTER — Other Ambulatory Visit: Payer: Self-pay | Admitting: Maternal Newborn

## 2018-03-13 DIAGNOSIS — O99013 Anemia complicating pregnancy, third trimester: Secondary | ICD-10-CM | POA: Insufficient documentation

## 2018-03-13 MED ORDER — FERROUS SULFATE 325 (65 FE) MG PO TABS: 325.0000 mg | ORAL_TABLET | Freq: Two times a day (BID) | ORAL | 3 refills | Status: DC

## 2018-03-13 NOTE — Progress Notes (Signed)
Rx sent for ferrous sulfate.

## 2018-03-18 ENCOUNTER — Other Ambulatory Visit: Payer: Self-pay | Admitting: Maternal Newborn

## 2018-03-18 DIAGNOSIS — O219 Vomiting of pregnancy, unspecified: Secondary | ICD-10-CM

## 2018-03-20 ENCOUNTER — Ambulatory Visit (INDEPENDENT_AMBULATORY_CARE_PROVIDER_SITE_OTHER): Payer: Managed Care, Other (non HMO) | Admitting: Maternal Newborn

## 2018-03-20 ENCOUNTER — Encounter: Payer: Self-pay | Admitting: Maternal Newborn

## 2018-03-20 ENCOUNTER — Other Ambulatory Visit (HOSPITAL_COMMUNITY)
Admission: RE | Admit: 2018-03-20 | Discharge: 2018-03-20 | Disposition: A | Discharge status: Home / Self Care | Payer: Managed Care, Other (non HMO) | Source: Ambulatory Visit | Attending: Maternal Newborn | Admitting: Maternal Newborn

## 2018-03-20 ENCOUNTER — Ambulatory Visit (INDEPENDENT_AMBULATORY_CARE_PROVIDER_SITE_OTHER): Payer: Managed Care, Other (non HMO)

## 2018-03-20 VITALS — BP 144/90 | Wt 200.0 lb

## 2018-03-20 DIAGNOSIS — Z3A31 31 weeks gestation of pregnancy: Secondary | ICD-10-CM | POA: Diagnosis not present

## 2018-03-20 DIAGNOSIS — N898 Other specified noninflammatory disorders of vagina: Secondary | ICD-10-CM | POA: Insufficient documentation

## 2018-03-20 DIAGNOSIS — Z369 Encounter for antenatal screening, unspecified: Secondary | ICD-10-CM

## 2018-03-20 DIAGNOSIS — O0990 Supervision of high risk pregnancy, unspecified, unspecified trimester: Secondary | ICD-10-CM

## 2018-03-20 DIAGNOSIS — O403XX0 Polyhydramnios, third trimester, not applicable or unspecified: Secondary | ICD-10-CM | POA: Insufficient documentation

## 2018-03-20 NOTE — Progress Notes (Signed)
C/o wt gain, BP, 'itchy down there. rj

## 2018-03-20 NOTE — Progress Notes (Signed)
Routine Prenatal Care Visit  Subjective  [name] is a 32 y.o. G3P1011 at [redacted]w[redacted]d being seen today for ongoing prenatal care.  She is currently monitored for the following issues for this high-risk pregnancy and has Supervision of high risk pregnancy, antepartum; History of gestational hypertension; History of cesarean delivery, currently pregnant; Hiatal hernia; Mitral valve disorder; Palpitations; Scoliosis deformity of spine; Chronic hypertension during pregnancy, antepartum; Anxiety; Anemia in pregnancy, third trimester; and Polyhydramnios affecting pregnancy in third trimester on their problem list.  ----------------------------------------------------------------------------------- Patient reports normal blood pressures when she takes occasional readings at home. Vaginal itching over the past week. Contractions: Not present. Vag. Bleeding: None.  Movement: Present. No leaking of fluid.  ----------------------------------------------------------------------------------- The following portions of the patient's history were reviewed and updated as appropriate: allergies, current medications, past family history, past medical history, past social history, past surgical history and problem list. Problem list updated.   Objective  Blood pressure (!) 158/90, weight 200 lb (90.7 kg), last menstrual period 08/13/2017, currently breastfeeding. Pregravid weight 168 lb (76.2 kg) Total Weight Gain 32 lb (14.5 kg) Body mass index is 35.43 kg/m. Urinalysis: Urine Protein: Negative Urine Glucose: Negative  Fetal Status: Fetal Heart Rate (bpm): 160 Fundal Height: 35 cm Movement: Present     General:  Alert, oriented and cooperative. Patient is in no acute distress.  Skin: Skin is warm and dry. No rash noted.   Cardiovascular: Normal heart rate noted  Respiratory: Normal respiratory effort, no problems with respiration noted  Abdomen: Soft, gravid, appropriate for gestational age. Pain/Pressure:  Absent     Pelvic:  Cervical exam deferred        Extremities: Normal range of motion.     Mental Status: Normal mood and affect. Normal behavior. Normal judgment and thought content.     Assessment   32 y.o. G3P1011 at [redacted]w[redacted]d, EDD 05/20/2018 by Last Menstrual Period presenting for routine prenatal visit.  Plan   third Problems (from 09/25/17 to present)    Problem Noted Resolved   Anemia in pregnancy, third trimester 03/13/2018 by Oswaldo Conroy, [name] No   Supervision of high risk pregnancy, antepartum 09/25/2017 by Oswaldo Conroy, [name] No   Overview Addendum 11/29/2017  9:09 PM by Farrel Conners, [name]    [facility]inic [facility] Prenatal Labs  Dating LMP Blood type: O/Positive/-- (01/08 1100)   Genetic Screen 1 Screen:    AFP:     Quad:     NIPS: Antibody:Negative (01/08 1100)  Anatomic [place]  Rubella: 1.45 (01/08 1100) Varicella:    GTT Early:               Third trimester:  RPR: Non Reactive (01/08 1100)   Rhogam  HBsAg: Negative (01/08 1100)   TDaP vaccine                       Flu Shot: HIV: Non Reactive (01/08 1100)   Baby Food                                GBS:   Contraception  Pap:  CBB     CS/VBAC    Support Person               Weight gain today is 19 pound difference from last visit. Although patient believes that part of this may be due to a transcription error in the chart, there is still  a 8 lb difference in 2 weeks if this is accounted for (actual weight last visit 192 lbs and 200 lbs this time).  Based on fundal height larger than dates and large weight gain, ultrasound was done today for AFI. The results showed polyhydramnios with AFI of 35.03 cm and fetal presentation is breech. This is a large increase from the AFI on June 17 of 23.72 cm.  A [facility] consult was ordered today, and I asked her to consider genetic screening since she declined it in early pregnancy. She is considering this option and will inform us of her decision.  Repeat BP today was  144/90.  She has had vaginal itching, sent swab to rule out yeast infection.  Return in about 2 weeks (around 04/03/2018) for ROB with growth scan.  Marcelyn BruinsJacelyn Manvir Prabhu, [name] 03/20/2018  4:41 PM

## 2018-03-22 ENCOUNTER — Other Ambulatory Visit: Payer: Self-pay | Admitting: Maternal Newborn

## 2018-03-22 DIAGNOSIS — Z3689 Encounter for other specified antenatal screening: Secondary | ICD-10-CM

## 2018-03-22 LAB — CERVICOVAGINAL ANCILLARY ONLY
Bacterial vaginitis: NEGATIVE
Candida vaginitis: NEGATIVE
Trichomonas: NEGATIVE

## 2018-04-02 ENCOUNTER — Ambulatory Visit (INDEPENDENT_AMBULATORY_CARE_PROVIDER_SITE_OTHER): Payer: Managed Care, Other (non HMO) | Admitting: Maternal Newborn

## 2018-04-02 ENCOUNTER — Encounter: Payer: Self-pay | Admitting: Maternal Newborn

## 2018-04-02 VITALS — BP 140/80 | Wt 202.0 lb

## 2018-04-02 DIAGNOSIS — Z3A33 33 weeks gestation of pregnancy: Secondary | ICD-10-CM

## 2018-04-02 DIAGNOSIS — O0990 Supervision of high risk pregnancy, unspecified, unspecified trimester: Secondary | ICD-10-CM

## 2018-04-02 NOTE — Progress Notes (Signed)
Routine Prenatal Care Visit  Subjective  [name] is a 32 y.o. G3P1011 at [redacted]w[redacted]d being seen today for ongoing prenatal care.  She is currently monitored for the following issues for this high-risk pregnancy and has Supervision of high risk pregnancy, antepartum; History of gestational hypertension; History of cesarean delivery, currently pregnant; Hiatal hernia; Mitral valve disorder; Palpitations; Scoliosis deformity of spine; Chronic hypertension during pregnancy, antepartum; Anxiety; Anemia in pregnancy, third trimester; and Polyhydramnios affecting pregnancy in third trimester on their problem list.  ----------------------------------------------------------------------------------- Patient reports frequent Braxton Hicks contractions. They are more frequent when she is on her feet or when her baby is very active. They resolve spontaneously if she rests on her side or is immersed in water.  Contractions: Not present. Vag. Bleeding: None.  Movement: Present. No leaking of fluid.  ----------------------------------------------------------------------------------- The following portions of the patient's history were reviewed and updated as appropriate: allergies, current medications, past family history, past medical history, past social history, past surgical history and problem list. Problem list updated.   Objective  Blood pressure 140/80, weight 202 lb (91.6 kg), last menstrual period 08/13/2017.  Pregravid weight 168 lb (76.2 kg) Total Weight Gain 34 lb (15.4 kg) Body mass index is 35.78 kg/m. Urinalysis: Urine Protein: Negative Urine Glucose: Negative  Fetal Status: Fetal Heart Rate (bpm): 148 Fundal Height: 39 cm Movement: Present  Presentation: Transverse  General:  Alert, oriented and cooperative. Patient is in no acute distress.  Skin: Skin is warm and dry. No rash noted.   Cardiovascular: Normal heart rate noted  Respiratory: Normal respiratory effort, no problems with  respiration noted  Abdomen: Soft, gravid, appropriate for gestational age. Pain/Pressure: Absent     Pelvic:  Cervical exam deferred        Extremities: Normal range of motion.     Mental Status: Normal mood and affect. Normal behavior. Normal judgment and thought content.     Assessment   32 y.o. G3P1011 at [redacted]w[redacted]d, EDD 05/20/2018 by Last Menstrual Period presenting for routine prenatal visit.  Plan   third Problems (from 09/25/17 to present)    Problem Noted Resolved   Anemia in pregnancy, third trimester 03/13/2018 by Oswaldo Conroy, [name] No   Supervision of high risk pregnancy, antepartum 09/25/2017 by Oswaldo Conroy, [name] No   Overview Addendum 11/29/2017  9:09 PM by Farrel Conners, [name]    Clinic [facility] Prenatal Labs  Dating LMP Blood type: O/Positive/-- (01/08 1100)   Genetic Screen 1 Screen:    AFP:     Quad:     NIPS: Antibody:Negative (01/08 1100)  Anatomic [place]  Rubella: 1.45 (01/08 1100) Varicella:    GTT Early:               Third trimester:  RPR: Non Reactive (01/08 1100)   Rhogam  HBsAg: Negative (01/08 1100)   TDaP vaccine                       Flu Shot: HIV: Non Reactive (01/08 1100)   Baby Food                                GBS:   Contraception  Pap:  CBB     CS/VBAC    Support Person               She has an appointment with [facility] this week for recommendations on  managing polyhydramnios.  [facility] begin weekly APT [facility]  Preterm labor symptoms and general obstetric precautions including but not limited to vaginal bleeding, contractions, leaking of fluid and fetal movement were reviewed  Return in about 1 week (around 04/09/2018) for ROB with NST/AFI.  Marcelyn BruinsJacelyn Schmid, [name] 04/02/2018  5:08 PM

## 2018-04-02 NOTE — Progress Notes (Signed)
C/O lots of B-H ctxs.rj

## 2018-04-04 ENCOUNTER — Ambulatory Visit
Admission: RE | Admit: 2018-04-04 | Discharge: 2018-04-04 | Disposition: A | Discharge status: Home / Self Care | Payer: Managed Care, Other (non HMO) | Source: Ambulatory Visit | Attending: Maternal & Fetal Medicine | Admitting: Maternal & Fetal Medicine

## 2018-04-04 DIAGNOSIS — O99013 Anemia complicating pregnancy, third trimester: Secondary | ICD-10-CM

## 2018-04-04 DIAGNOSIS — Z3689 Encounter for other specified antenatal screening: Secondary | ICD-10-CM

## 2018-04-04 DIAGNOSIS — O0990 Supervision of high risk pregnancy, unspecified, unspecified trimester: Secondary | ICD-10-CM

## 2018-04-10 ENCOUNTER — Ambulatory Visit (INDEPENDENT_AMBULATORY_CARE_PROVIDER_SITE_OTHER): Payer: Managed Care, Other (non HMO) | Admitting: Certified Nurse Midwife

## 2018-04-10 ENCOUNTER — Ambulatory Visit (INDEPENDENT_AMBULATORY_CARE_PROVIDER_SITE_OTHER): Payer: Managed Care, Other (non HMO)

## 2018-04-10 VITALS — BP 150/88 | Wt 207.0 lb

## 2018-04-10 DIAGNOSIS — Z3A34 34 weeks gestation of pregnancy: Secondary | ICD-10-CM

## 2018-04-10 DIAGNOSIS — O403XX0 Polyhydramnios, third trimester, not applicable or unspecified: Secondary | ICD-10-CM

## 2018-04-10 DIAGNOSIS — O0990 Supervision of high risk pregnancy, unspecified, unspecified trimester: Secondary | ICD-10-CM

## 2018-04-10 DIAGNOSIS — O10913 Unspecified pre-existing hypertension complicating pregnancy, third trimester: Secondary | ICD-10-CM

## 2018-04-10 DIAGNOSIS — O10919 Unspecified pre-existing hypertension complicating pregnancy, unspecified trimester: Secondary | ICD-10-CM

## 2018-04-10 DIAGNOSIS — Z369 Encounter for antenatal screening, unspecified: Secondary | ICD-10-CM

## 2018-04-10 NOTE — Progress Notes (Signed)
No concerns.rj 

## 2018-04-10 NOTE — Progress Notes (Signed)
HROB at 34wk2d: CHTN on no meds presents for NST/AFI. Had polyhydraminos on 7/3 Referred to [facility] for poly hydraminos on 04/04/18. Ultrasound at that time revealed EFW 5#10 oz (81%) and AFI: 28.25 cm AFI today is 34.73cm. NST reactive with baseline 140-145 and accelerations to 160s  Blood pressures all elevated in mild range  since 15 weeks. Today 150/88. Denies headache, CP, SOB, visual changes, RUQ pain . Weight up 5# in 2 weeks Discussed method of delivery with Dr Bonney AidStaebler, then with patient. Patient originally interested in VBAC. Explained possibility of needing IOL for worsening HTN. Increased risk of uterine rupture with prior CS and polyhydraminos with induction of labor.  Scheduled CS for 29 August with Dr Jean RosenthalJackson although may need to do sooner for worsening HTN. CMP, CBC, and PC ratio today. RTO in 2 days for blood pressure check. Offer TDAP at that time.  Breast feeding PP contraception discussed Farrel Connersolleen Larra Crunkleton, [name]

## 2018-04-11 ENCOUNTER — Telehealth: Payer: Self-pay | Admitting: Certified Nurse Midwife

## 2018-04-11 LAB — COMPREHENSIVE METABOLIC PANEL
A/G RATIO: 1.1 — AB (ref 1.2–2.2)
ALK PHOS: 134 IU/L — AB (ref 39–117)
ALT: 7 IU/L (ref 0–32)
AST: 12 IU/L (ref 0–40)
Albumin: 3.4 g/dL — ABNORMAL LOW (ref 3.5–5.5)
BUN/Creatinine Ratio: 12 (ref 9–23)
BUN: 6 mg/dL (ref 6–20)
Bilirubin Total: <0.2 mg/dL (ref 0.0–1.2)
CO2: 21 mmol/L (ref 20–29)
Calcium: 8.6 mg/dL — ABNORMAL LOW (ref 8.7–10.2)
Chloride: 101 mmol/L (ref 96–106)
Creatinine, Ser: 0.5 mg/dL — ABNORMAL LOW (ref 0.57–1.00)
GFR calc Af Amer: 148 mL/min/{1.73_m2} (ref >59)
GFR calc non Af Amer: 128 mL/min/{1.73_m2} (ref >59)
GLOBULIN, TOTAL: 3 g/dL (ref 1.5–4.5)
Glucose: 104 mg/dL — ABNORMAL HIGH (ref 65–99)
POTASSIUM: 3.8 mmol/L (ref 3.5–5.2)
SODIUM: 138 mmol/L (ref 134–144)
Total Protein: 6.4 g/dL (ref 6.0–8.5)

## 2018-04-11 LAB — PROTEIN / CREATININE RATIO, URINE
Creatinine, Urine: 112 mg/dL
PROTEIN/CREAT RATIO: 222 mg/g{creat} — AB (ref 0–200)
Protein, Ur: 24.9 mg/dL

## 2018-04-11 LAB — CBC WITH DIFFERENTIAL/PLATELET
BASOS ABS: 0 10*3/uL (ref 0.0–0.2)
Basos: 0 %
EOS (ABSOLUTE): 0.1 10*3/uL (ref 0.0–0.4)
EOS: 0 %
HEMOGLOBIN: 11.1 g/dL (ref 11.1–15.9)
Hematocrit: 34.1 % (ref 34.0–46.6)
Immature Grans (Abs): 0.1 10*3/uL (ref 0.0–0.1)
Immature Granulocytes: 1 %
LYMPHS ABS: 1.7 10*3/uL (ref 0.7–3.1)
LYMPHS: 12 %
MCH: 27.1 pg (ref 26.6–33.0)
MCHC: 32.6 g/dL (ref 31.5–35.7)
MCV: 83 fL (ref 79–97)
MONOCYTES: 9 %
MONOS ABS: 1.3 10*3/uL — AB (ref 0.1–0.9)
NEUTROS PCT: 78 %
Neutrophils Absolute: 11.3 10*3/uL — ABNORMAL HIGH (ref 1.4–7.0)
Platelets: 163 10*3/uL (ref 150–450)
RBC: 4.1 x10E6/uL (ref 3.77–5.28)
RDW: 18.5 % — ABNORMAL HIGH (ref 12.3–15.4)
WBC: 14.4 10*3/uL — AB (ref 3.4–10.8)

## 2018-04-12 ENCOUNTER — Encounter: Payer: Self-pay | Admitting: Maternal Newborn

## 2018-04-12 ENCOUNTER — Ambulatory Visit (INDEPENDENT_AMBULATORY_CARE_PROVIDER_SITE_OTHER): Payer: Managed Care, Other (non HMO) | Admitting: Maternal Newborn

## 2018-04-12 VITALS — BP 138/86 | Wt 205.2 lb

## 2018-04-12 DIAGNOSIS — Z3A34 34 weeks gestation of pregnancy: Secondary | ICD-10-CM | POA: Diagnosis not present

## 2018-04-12 DIAGNOSIS — O10913 Unspecified pre-existing hypertension complicating pregnancy, third trimester: Secondary | ICD-10-CM

## 2018-04-12 DIAGNOSIS — Z23 Encounter for immunization: Secondary | ICD-10-CM

## 2018-04-12 DIAGNOSIS — O403XX0 Polyhydramnios, third trimester, not applicable or unspecified: Secondary | ICD-10-CM

## 2018-04-12 DIAGNOSIS — O0990 Supervision of high risk pregnancy, unspecified, unspecified trimester: Secondary | ICD-10-CM

## 2018-04-12 DIAGNOSIS — O10919 Unspecified pre-existing hypertension complicating pregnancy, unspecified trimester: Secondary | ICD-10-CM

## 2018-04-12 MED ORDER — TETANUS-DIPHTH-ACELL PERTUSSIS 5-2.5-18.5 LF-MCG/0.5 IM SUSP
0.5000 mL | Freq: Once | INTRAMUSCULAR | Status: AC
  Administered 2018-04-12: 0.5 mL via INTRAMUSCULAR

## 2018-04-12 NOTE — Progress Notes (Addendum)
Prenatal Care Visit  Subjective  [name] is a 32 y.o. G3P1011 at [redacted]w[redacted]d being seen today for ongoing prenatal care.  She is currently monitored for the following issues for this high-risk pregnancy and has Supervision of high risk pregnancy, antepartum; History of gestational hypertension; History of cesarean delivery, currently pregnant; Hiatal hernia; Mitral valve disorder; Palpitations; Scoliosis deformity of spine; Chronic hypertension during pregnancy, antepartum; Anxiety; Anemia in pregnancy, third trimester; and Polyhydramnios affecting pregnancy in third trimester on their problem list.  ----------------------------------------------------------------------------------- Patient reports no complaints.  Brought log of home blood pressures taken yesterday: 125/80, 133/90, 132/84. Contractions: Not present. Vag. Bleeding: None.  Movement: Present. No leaking of fluid.  ----------------------------------------------------------------------------------- The following portions of the patient's history were reviewed and updated as appropriate: allergies, current medications, past family history, past medical history, past social history, past surgical history and problem list. Problem list updated.   Objective  Blood pressure (!) 142/94, weight 205 lb 4 oz (93.1 kg), last menstrual period 08/13/2017. Pregravid weight 168 lb (76.2 kg) Total Weight Gain 37 lb 4 oz (16.9 kg)  Body mass index is 36.36 kg/m. Urinalysis: Urine Protein: Negative Urine Glucose: Negative  Fetal Status: Fetal Heart Rate (bpm): 152   Movement: Present  Presentation: Vertex  General:  Alert, oriented and cooperative. Patient is in no acute distress.  Skin: Skin is warm and dry. No rash noted.   Cardiovascular: Normal heart rate noted  Respiratory: Normal respiratory effort, no problems with respiration noted  Abdomen: Soft, gravid, appropriate for gestational age. Pain/Pressure: Absent     Pelvic:  Cervical  exam deferred        Extremities: Normal range of motion.     Mental Status: Normal mood and affect. Normal behavior. Normal judgment and thought content.     Assessment   32 y.o. G3P1011 at [redacted]w[redacted]d, EDD 05/20/2018 by Last Menstrual Period presenting for a blood pressure check.  Plan   third Problems (from 09/25/17 to present)    Problem Noted Resolved   Anemia in pregnancy, third trimester 03/13/2018 by Oswaldo Conroy, [name] No   Supervision of high risk pregnancy, antepartum 09/25/2017 by Oswaldo Conroy, [name] No   Overview Addendum 11/29/2017  9:09 PM by Farrel Conners, [name]    [facility]inic [facility] Prenatal Labs  Dating LMP Blood type: O/Positive/-- (01/08 1100)   Genetic Screen 1 Screen:    AFP:     Quad:     NIPS: Antibody:Negative (01/08 1100)  Anatomic [place]  Rubella: 1.45 (01/08 1100) Varicella:    GTT Early:               Third trimester:  RPR: Non Reactive (01/08 1100)   Rhogam  HBsAg: Negative (01/08 1100)   TDaP vaccine                       Flu Shot: HIV: Non Reactive (01/08 1100)   Baby Food                                GBS:   Contraception  Pap:  CBB     CS/VBAC    Support Person               Repeat blood pressure today 138/86. Discussed twice weekly APT now. Will see MD next visit for delivery planning.  TDaP done today.  Preterm labor symptoms and general obstetric precautions including  but not limited to vaginal bleeding, contractions, leaking of fluid and fetal movement were reviewed.  Return in about 3 days (around 04/15/2018) for ROB with NST.  Marcelyn BruinsJacelyn Schmid, [name] 04/12/2018  10:07 AM

## 2018-04-12 NOTE — Progress Notes (Signed)
C/o has questions - procedures for induction or c/s.  TDAP today. BT signed today.rj

## 2018-04-15 ENCOUNTER — Encounter: Payer: Managed Care, Other (non HMO) | Admitting: Certified Nurse Midwife

## 2018-04-15 ENCOUNTER — Ambulatory Visit (INDEPENDENT_AMBULATORY_CARE_PROVIDER_SITE_OTHER): Payer: Managed Care, Other (non HMO) | Admitting: Obstetrics & Gynecology

## 2018-04-15 ENCOUNTER — Encounter: Payer: Managed Care, Other (non HMO) | Admitting: Obstetrics & Gynecology

## 2018-04-15 ENCOUNTER — Observation Stay
Admission: EM | Admit: 2018-04-15 | Discharge: 2018-04-15 | Disposition: A | Discharge status: Home / Self Care | Payer: Managed Care, Other (non HMO) | Attending: Obstetrics & Gynecology | Admitting: Obstetrics & Gynecology

## 2018-04-15 ENCOUNTER — Other Ambulatory Visit: Payer: Self-pay

## 2018-04-15 VITALS — BP 138/98 | Wt 206.0 lb

## 2018-04-15 DIAGNOSIS — O10913 Unspecified pre-existing hypertension complicating pregnancy, third trimester: Secondary | ICD-10-CM

## 2018-04-15 DIAGNOSIS — O9989 Other specified diseases and conditions complicating pregnancy, childbirth and the puerperium: Secondary | ICD-10-CM | POA: Diagnosis present

## 2018-04-15 DIAGNOSIS — N200 Calculus of kidney: Secondary | ICD-10-CM | POA: Diagnosis not present

## 2018-04-15 DIAGNOSIS — Z3A35 35 weeks gestation of pregnancy: Secondary | ICD-10-CM

## 2018-04-15 DIAGNOSIS — O0990 Supervision of high risk pregnancy, unspecified, unspecified trimester: Secondary | ICD-10-CM

## 2018-04-15 DIAGNOSIS — O99013 Anemia complicating pregnancy, third trimester: Secondary | ICD-10-CM

## 2018-04-15 DIAGNOSIS — O403XX0 Polyhydramnios, third trimester, not applicable or unspecified: Secondary | ICD-10-CM | POA: Diagnosis not present

## 2018-04-15 DIAGNOSIS — O34219 Maternal care for unspecified type scar from previous cesarean delivery: Secondary | ICD-10-CM

## 2018-04-15 DIAGNOSIS — R319 Hematuria, unspecified: Secondary | ICD-10-CM | POA: Diagnosis present

## 2018-04-15 LAB — URINALYSIS, COMPLETE (UACMP) WITH MICROSCOPIC
BILIRUBIN URINE: NEGATIVE
Bacteria, UA: NONE SEEN
Glucose, UA: 50 mg/dL — AB
Ketones, ur: 5 mg/dL — AB
LEUKOCYTES UA: NEGATIVE
Nitrite: NEGATIVE
PH: 5 (ref 5.0–8.0)
Protein, ur: NEGATIVE mg/dL
SPECIFIC GRAVITY, URINE: 1.018 (ref 1.005–1.030)

## 2018-04-15 MED ORDER — ACETAMINOPHEN 325 MG PO TABS: 650.0000 mg | ORAL_TABLET | ORAL | Status: DC | PRN

## 2018-04-15 NOTE — OB Triage Note (Signed)
Pt arrived to triage with c/o vaginal bleeding after urination.  Pt states urgency and burning upon urination.  Quarter-sized blood per pt.  Pt denies LOF and contractions and is feeling positive fetal movement.  EFM and toco applied and assessing.

## 2018-04-15 NOTE — Final Progress Note (Signed)
Physician Final Progress Note  Patient ID: [name] MRN: [id number] DOB/AGE: March 05, 1986 32 y.o.  Admit date: 04/15/2018 Admitting provider: Nadara Mustard, MD Discharge date: 04/15/2018  Admission Diagnoses: Indication for care in labor and delivery  Discharge Diagnoses:  Active Problems:   Hematuria  History of Present Illness: The patient is a 32 y.o. female G3P1011 at [redacted]w[redacted]d who noticed a quarter size amount of blood on toilet tissue this afternoon after voiding. She says that she has had some pain with urination a couple of times today. Denies loss of fluid and contractions. Baby is moving well.  Review of Systems: Negative unless otherwise noted in HPI.  Past Medical History:  Diagnosis Date  . Anxiety   . Hiatal hernia   . 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 Palpitations   . Scoliosis   . Trouble in sleeping     Past Surgical History:  Procedure Laterality Date  . CESAREAN SECTION N/A 03/04/2013   Procedure: CESAREAN SECTION;  Surgeon: Bing Plume, MD;  Location: [facility];  Service: Obstetrics;  Laterality: N/A;    No current facility-administered medications on file prior to encounter.    Current Outpatient Medications on File Prior to Encounter  Medication Sig Dispense Refill  . cetirizine (ZYRTEC) 10 MG tablet Take 10 mg by mouth daily.    Marland Kitchen esomeprazole (NEXIUM) 10 MG packet Take 10 mg by mouth daily before breakfast.    . ferrous sulfate (FERROUSUL) 325 (65 FE) MG tablet Take 1 tablet (325 mg total) by mouth 2 (two) times daily with a meal. 60 tablet 3  . Prenatal Vit-Fe Fumarate-FA (PRENATAL MULTIVITAMIN) TABS Take 1 tablet by mouth daily.    . promethazine (PHENERGAN) 25 MG tablet TAKE 1 TABLET(25 MG) BY MOUTH EVERY 6 HOURS AS NEEDED FOR NAUSEA OR VOMITING 30 tablet 0  . sertraline (ZOLOFT) 100 MG tablet TK 1 T PO D FOR ANXIETY  2  . nystatin cream (MYCOSTATIN) Apply 1 application topically 2 (two) times daily. (Patient not taking: Reported on  04/04/2018) 30 g 1  . pantoprazole (PROTONIX) 20 MG tablet Take 1 tablet (20 mg total) by mouth daily. (Patient not taking: Reported on 12/10/2017) 30 tablet 2  . terconazole (TERAZOL 7) 0.4 % vaginal cream Place 1 applicator vaginally at bedtime. Use for 7 days. (Patient not taking: Reported on 04/04/2018) 45 g 1    Allergies  Allergen Reactions  . Amoxicillin Itching  . Keflex [Cephalexin] Itching    Social History   Socioeconomic History  . Marital status: Married    Spouse name: Not on file  . Number of children: 1  . Years of education: 13  . Highest education level: Not on file  Occupational History  . Occupation: self employed  Social Needs  . Financial resource strain: Not on file  . Food insecurity:    Worry: Not on file    Inability: Not on file  . Transportation needs:    Medical: Not on file    Non-medical: Not on file  Tobacco Use  . Smoking status: Never Smoker  . Smokeless tobacco: Never Used  Substance and Sexual Activity  . Alcohol use: No  . Drug use: No  . Sexual activity: Yes    Birth control/protection: None  Lifestyle  . Physical activity:    Days per week: Not on file    Minutes per session: Not on file  . Stress: Not on file  Relationships  . Social connections:  Talks on phone: Not on file    Gets together: Not on file    Attends religious service: Not on file    Active member of club or organization: Not on file    Attends meetings of clubs or organizations: Not on file    Relationship status: Not on file  . Intimate partner violence:    Fear of current or ex partner: Not on file    Emotionally abused: Not on file    Physically abused: Not on file    Forced sexual activity: Not on file  Other Topics Concern  . Not on file  Social History Narrative  . Not on file    Physical Exam: BP (!) 144/87   Pulse (!) 123   Temp 98.3 F (36.8 C) (Oral)   Resp 18   Ht 5\' 3"  (1.6 m)   Wt 206 lb (93.4 kg)   LMP 08/13/2017   BMI 36.49  kg/m   Gen: NAD CV: Tachycardia Abdomen: gravid, non-tender Back: No CVAT Pulm: No increased work of breathing Pelvic: deferred Ext: No signs of DVT  NST: Baseline: 135 Variability: moderate Accelerations: present Decelerations: absent Tocometry: occasional uterine irritability The patient was monitored for 30+ minutes, fetal heart rate tracing was deemed reactive.  Significant Findings/ Diagnostic Studies: labs: UA positive for RBCs and calcium oxalate crystals, negative for leukocytes and nitrites. Urine culture pending.  Procedures: NST, reactive  Discharge Condition: good  Disposition: Discharge disposition: 01-Home or Self Care       Diet: Regular diet  Discharge Activity: Activity as tolerated  Discharge Instructions    Discharge activity:  No Restrictions   Complete by:  As directed    Discharge diet:  No restrictions   Complete by:  As directed    Fetal Kick Count:  Lie on our left side for one hour after a meal, and count the number of times your baby kicks.  If it is less than 5 times, get up, move around and drink some juice.  Repeat the test 30 minutes later.  If it is still less than 5 kicks in an hour, notify your doctor.   Complete by:  As directed    LABOR:  When conractions begin, you should start to time them from the beginning of one contraction to the beginning  of the next.  When contractions are 5 - 10 minutes apart or less and have been regular for at least an hour, you should call your health care provider.   Complete by:  As directed    No sexual activity restrictions   Complete by:  As directed    Notify physician for bleeding from the vagina   Complete by:  As directed    Notify physician for blurring of vision or spots before the eyes   Complete by:  As directed    Notify physician for chills or fever   Complete by:  As directed    Notify physician for fainting spells, "black outs" or loss of consciousness   Complete by:  As directed     Notify physician for increase in vaginal discharge   Complete by:  As directed    Notify physician for leaking of fluid   Complete by:  As directed    Notify physician for pain or burning when urinating   Complete by:  As directed    Notify physician for pelvic pressure (sudden increase)   Complete by:  As directed    Notify physician for  severe or continued nausea or vomiting   Complete by:  As directed    Notify physician for sudden gushing of fluid from the vagina (with or without continued leaking)   Complete by:  As directed    Notify physician for sudden, constant, or occasional abdominal pain   Complete by:  As directed    Notify physician if baby moving less than usual   Complete by:  As directed      Allergies as of 04/15/2018      Reactions   Amoxicillin Itching   Keflex [cephalexin] Itching      Medication List    STOP taking these medications   terconazole 0.4 % vaginal cream Commonly known as:  TERAZOL 7     TAKE these medications   cetirizine 10 MG tablet Commonly known as:  ZYRTEC Take 10 mg by mouth daily.   esomeprazole 10 MG packet Commonly known as:  NEXIUM Take 10 mg by mouth daily before breakfast.   ferrous sulfate 325 (65 FE) MG tablet Commonly known as:  FERROUSUL Take 1 tablet (325 mg total) by mouth 2 (two) times daily with a meal.   nystatin cream Commonly known as:  MYCOSTATIN Apply 1 application topically 2 (two) times daily.   pantoprazole 20 MG tablet Commonly known as:  PROTONIX Take 1 tablet (20 mg total) by mouth daily.   prenatal multivitamin Tabs tablet Take 1 tablet by mouth daily.   promethazine 25 MG tablet Commonly known as:  PHENERGAN TAKE 1 TABLET(25 MG) BY MOUTH EVERY 6 HOURS AS NEEDED FOR NAUSEA OR VOMITING   sertraline 100 MG tablet Commonly known as:  ZOLOFT TK 1 T PO D FOR ANXIETY      Likely cause of hematuria is small kidney stones. She will return to triage with pain. Follow-up in office on Thursday  already scheduled.  Signed: Oswaldo Conroy, [name]  04/15/2018, 8:24 PM

## 2018-04-15 NOTE — OB Triage Note (Signed)
Discharge instructions provided and reviewed.  Follow up care discussed.  Pt verbalized understanding. 

## 2018-04-15 NOTE — Progress Notes (Signed)
  Subjective  Fetal Movement? yes Contractions? no Leaking Fluid? no Vaginal Bleeding? no  Objective  BP (!) 138/98   Wt 206 lb (93.4 kg)   LMP 08/13/2017   BMI 36.49 kg/m  General: NAD Pumonary: no increased work of breathing Abdomen: gravid, non-tender Extremities: no edema Psychiatric: mood appropriate, affect full  A NST procedure was performed with FHR monitoring and a normal baseline established, appropriate time of 20-40 minutes of evaluation, and accels >2 seen w 15x15 characteristics.  Results show a REACTIVE NST.   Assessment  32 y.o. G3P1011 at 7810w0d by  05/20/2018, by Last Menstrual Period presenting for routine prenatal visit  Plan   Problem List Items Addressed This Visit      Cardiovascular and Mediastinum   Chronic hypertension during pregnancy, antepartum     Other   Supervision of high risk pregnancy, antepartum   History of cesarean delivery, currently pregnant   Polyhydramnios affecting pregnancy in third trimester    Other Visit Diagnoses    [redacted] weeks gestation of pregnancy    -  Primary    NST R, repeat 3 days w AFI BP at home normal (log reviewed).  Plan exp mgt for now, desires VBAC.  Jenna MajorPaul Quatavious Rossa, MD, Merlinda FrederickFACOG [facility], [facility] 04/15/2018  11:27 AM

## 2018-04-15 NOTE — Discharge Summary (Signed)
See Final Progress Note 04/15/2018.

## 2018-04-17 LAB — URINE CULTURE

## 2018-04-18 ENCOUNTER — Other Ambulatory Visit: Payer: Self-pay | Admitting: Obstetrics & Gynecology

## 2018-04-18 ENCOUNTER — Ambulatory Visit (INDEPENDENT_AMBULATORY_CARE_PROVIDER_SITE_OTHER): Payer: Managed Care, Other (non HMO)

## 2018-04-18 ENCOUNTER — Encounter: Payer: Self-pay | Admitting: Maternal Newborn

## 2018-04-18 ENCOUNTER — Ambulatory Visit (INDEPENDENT_AMBULATORY_CARE_PROVIDER_SITE_OTHER): Payer: Managed Care, Other (non HMO) | Admitting: Maternal Newborn

## 2018-04-18 VITALS — BP 140/90 | Wt 206.5 lb

## 2018-04-18 DIAGNOSIS — O403XX0 Polyhydramnios, third trimester, not applicable or unspecified: Secondary | ICD-10-CM

## 2018-04-18 DIAGNOSIS — Z3A35 35 weeks gestation of pregnancy: Secondary | ICD-10-CM | POA: Diagnosis not present

## 2018-04-18 DIAGNOSIS — O09293 Supervision of pregnancy with other poor reproductive or obstetric history, third trimester: Secondary | ICD-10-CM

## 2018-04-18 DIAGNOSIS — O212 Late vomiting of pregnancy: Secondary | ICD-10-CM

## 2018-04-18 DIAGNOSIS — O9989 Other specified diseases and conditions complicating pregnancy, childbirth and the puerperium: Secondary | ICD-10-CM

## 2018-04-18 DIAGNOSIS — Z369 Encounter for antenatal screening, unspecified: Secondary | ICD-10-CM

## 2018-04-18 DIAGNOSIS — O219 Vomiting of pregnancy, unspecified: Secondary | ICD-10-CM

## 2018-04-18 DIAGNOSIS — O0990 Supervision of high risk pregnancy, unspecified, unspecified trimester: Secondary | ICD-10-CM

## 2018-04-18 DIAGNOSIS — R300 Dysuria: Secondary | ICD-10-CM

## 2018-04-18 LAB — POCT URINALYSIS DIPSTICK
Bilirubin, UA: NEGATIVE
GLUCOSE UA: NEGATIVE
Nitrite, UA: NEGATIVE
Protein, UA: POSITIVE — AB
RBC UA: NEGATIVE
Urobilinogen, UA: NEGATIVE E.U./dL — AB
pH, UA: 6 (ref 5.0–8.0)

## 2018-04-18 MED ORDER — PROMETHAZINE HCL 25 MG PO TABS: ORAL_TABLET | ORAL | 0 refills | Status: DC

## 2018-04-18 NOTE — Progress Notes (Signed)
Routine Prenatal Care Visit  Subjective  [name] is a 32 y.o. G3P1011 at 6561w3d being seen today for ongoing prenatal care.  She is currently monitored for the following issues for this high-risk pregnancy and has Supervision of high risk pregnancy, antepartum; History of gestational hypertension; History of cesarean delivery, currently pregnant; Hiatal hernia; Mitral valve disorder; Palpitations; Scoliosis deformity of spine; Chronic hypertension during pregnancy, antepartum; Anxiety; Anemia in pregnancy, third trimester; Polyhydramnios affecting pregnancy in third trimester; and Hematuria on their problem list.  ----------------------------------------------------------------------------------- Patient reports some burning with urination. Nausea is controlled with Phenergan. Vag. Bleeding: None.  Movement: Present. No leaking of fluid.  ----------------------------------------------------------------------------------- The following portions of the patient's history were reviewed and updated as appropriate: allergies, current medications, past family history, past medical history, past social history, past surgical history and problem list. Problem list updated.   Objective  Blood pressure 140/90, weight 206 lb 8 oz (93.7 kg), last menstrual period 08/13/2017, currently breastfeeding. Pregravid weight 168 lb (76.2 kg) Total Weight Gain 38 lb 8 oz (17.5 kg) Urinalysis: Urine Protein: Negative Urine Glucose: Negative  Fetal Status: Fetal Heart Rate (bpm): 151   Movement: Present  Presentation: Vertex  General:  Alert, oriented and cooperative. Patient is in no acute distress.  Skin: Skin is warm and dry. No rash noted.   Cardiovascular: Normal heart rate noted  Respiratory: Normal respiratory effort, no problems with respiration noted  Abdomen: Soft, gravid, appropriate for gestational age. Pain/Pressure: Present     Pelvic:  Cervical exam deferred        Extremities: Normal range  of motion.     Mental Status: Normal mood and affect. Normal behavior. Normal judgment and thought content.   NST Baseline: 150 Variability: moderate Accelerations: present Decelerations: absent Tocometry: not done The patient was monitored for 20+ minutes, fetal heart rate tracing was deemed reactive.  Assessment   32 y.o. G3P1011 at 4361w3d, EDD 05/20/2018 by Last Menstrual Period presenting for routine prenatal visit.  Plan   third Problems (from 09/25/17 to present)    Problem Noted Resolved   Anemia in pregnancy, third trimester 03/13/2018 by [name], Braylinn Gulden Y, [name] No   Supervision of high risk pregnancy, antepartum 09/25/2017 by [name], Porcha Deblanc Y, [name] No   Overview Addendum 11/29/2017  9:09 PM by [name], Colleen, [name]    Clinic [facility] Prenatal Labs  Dating LMP Blood type: O/Positive/-- (01/08 1100)   Genetic Screen 1 Screen:    AFP:     Quad:     NIPS: Antibody:Negative (01/08 1100)  Anatomic US  Rubella: 1.45 (01/08 1100) Varicella:    GTT Early:               Third trimester:  RPR: Non Reactive (01/08 1100)   Rhogam  HBsAg: Negative (01/08 1100)   TDaP vaccine                       Flu Shot: HIV: Non Reactive (01/08 1100)   Baby Food                                GBS:   Contraception  Pap:  CBB     CS/VBAC    Support Person               Home blood pressure log reviewed, all normal to mild range: 122/80, 141/83, 140/79, 124/82.  Reactive NST, baby very  active and hard to trace on strip,  but observed accelerations x3 in room.  AFI 29.22 cm today.  Resend urine culture today as hospital sample showed multiple species and she has symptoms.  Refill Rx for Phenergan.  Preterm labor symptoms and general obstetric precautions were reviewed.  Please refer to After Visit Summary for other counseling recommendations.   Return in about 4 days (around 04/22/2018) for ROB with NST.  [name], [name] 04/18/2018  12:01 PM

## 2018-04-18 NOTE — Progress Notes (Signed)
C/O burning c voiding but not all the time; terrible groin pain.

## 2018-04-20 LAB — URINE CULTURE

## 2018-04-22 ENCOUNTER — Ambulatory Visit (INDEPENDENT_AMBULATORY_CARE_PROVIDER_SITE_OTHER): Payer: Managed Care, Other (non HMO) | Admitting: Certified Nurse Midwife

## 2018-04-22 VITALS — BP 136/100 | Wt 208.0 lb

## 2018-04-22 DIAGNOSIS — Z3A36 36 weeks gestation of pregnancy: Secondary | ICD-10-CM

## 2018-04-22 DIAGNOSIS — O403XX0 Polyhydramnios, third trimester, not applicable or unspecified: Secondary | ICD-10-CM | POA: Diagnosis not present

## 2018-04-22 DIAGNOSIS — N949 Unspecified condition associated with female genital organs and menstrual cycle: Secondary | ICD-10-CM

## 2018-04-22 DIAGNOSIS — O10913 Unspecified pre-existing hypertension complicating pregnancy, third trimester: Secondary | ICD-10-CM | POA: Diagnosis not present

## 2018-04-22 DIAGNOSIS — O163 Unspecified maternal hypertension, third trimester: Secondary | ICD-10-CM

## 2018-04-22 DIAGNOSIS — Z3685 Encounter for antenatal screening for Streptococcus B: Secondary | ICD-10-CM

## 2018-04-22 DIAGNOSIS — O0990 Supervision of high risk pregnancy, unspecified, unspecified trimester: Secondary | ICD-10-CM

## 2018-04-22 DIAGNOSIS — O34219 Maternal care for unspecified type scar from previous cesarean delivery: Secondary | ICD-10-CM

## 2018-04-22 DIAGNOSIS — O9989 Other specified diseases and conditions complicating pregnancy, childbirth and the puerperium: Secondary | ICD-10-CM

## 2018-04-22 DIAGNOSIS — O10919 Unspecified pre-existing hypertension complicating pregnancy, unspecified trimester: Secondary | ICD-10-CM

## 2018-04-22 LAB — FETAL NONSTRESS TEST

## 2018-04-22 LAB — POCT URINALYSIS DIPSTICK
Bilirubin, UA: NEGATIVE
Blood, UA: NEGATIVE
Glucose, UA: NEGATIVE
KETONES UA: NEGATIVE
NITRITE UA: NEGATIVE
PROTEIN UA: POSITIVE — AB
Spec Grav, UA: 1.02 (ref 1.010–1.025)
Urobilinogen, UA: NEGATIVE E.U./dL — AB
pH, UA: 6 (ref 5.0–8.0)

## 2018-04-22 NOTE — Progress Notes (Signed)
GBS done. Complains of vaginal burning Wet prep negative for clue cells, Trich, hyphae Cervix: 1.5/60%/-3

## 2018-04-22 NOTE — Progress Notes (Signed)
C/o itching/burning inside in the front when she sits. See u/a.rj

## 2018-04-23 LAB — CBC WITH DIFFERENTIAL/PLATELET
Basophils Absolute: 0 10*3/uL (ref 0.0–0.2)
Basos: 0 %
EOS (ABSOLUTE): 0 10*3/uL (ref 0.0–0.4)
EOS: 0 %
Hematocrit: 36.5 % (ref 34.0–46.6)
Hemoglobin: 12 g/dL (ref 11.1–15.9)
IMMATURE GRANS (ABS): 0 10*3/uL (ref 0.0–0.1)
IMMATURE GRANULOCYTES: 0 %
LYMPHS: 10 %
Lymphocytes Absolute: 1.4 10*3/uL (ref 0.7–3.1)
MCH: 27.5 pg (ref 26.6–33.0)
MCHC: 32.9 g/dL (ref 31.5–35.7)
MCV: 84 fL (ref 79–97)
Monocytes Absolute: 0.8 10*3/uL (ref 0.1–0.9)
Monocytes: 6 %
NEUTROS PCT: 84 %
Neutrophils Absolute: 10.9 10*3/uL — ABNORMAL HIGH (ref 1.4–7.0)
Platelets: 164 10*3/uL (ref 150–450)
RBC: 4.37 x10E6/uL (ref 3.77–5.28)
RDW: 21.1 % — ABNORMAL HIGH (ref 12.3–15.4)
WBC: 13.1 10*3/uL — AB (ref 3.4–10.8)

## 2018-04-23 LAB — COMPREHENSIVE METABOLIC PANEL
ALT: 6 IU/L (ref 0–32)
AST: 11 IU/L (ref 0–40)
Albumin/Globulin Ratio: 1.3 (ref 1.2–2.2)
Albumin: 3.5 g/dL (ref 3.5–5.5)
Alkaline Phosphatase: 145 IU/L — ABNORMAL HIGH (ref 39–117)
BUN/Creatinine Ratio: 13 (ref 9–23)
BUN: 7 mg/dL (ref 6–20)
Bilirubin Total: <0.2 mg/dL (ref 0.0–1.2)
CALCIUM: 9.2 mg/dL (ref 8.7–10.2)
CO2: 20 mmol/L (ref 20–29)
CREATININE: 0.53 mg/dL — AB (ref 0.57–1.00)
Chloride: 102 mmol/L (ref 96–106)
GFR calc non Af Amer: 126 mL/min/{1.73_m2} (ref >59)
GFR, EST AFRICAN AMERICAN: 145 mL/min/{1.73_m2} (ref >59)
Globulin, Total: 2.8 g/dL (ref 1.5–4.5)
Glucose: 88 mg/dL (ref 65–99)
Potassium: 4.4 mmol/L (ref 3.5–5.2)
Sodium: 137 mmol/L (ref 134–144)
TOTAL PROTEIN: 6.3 g/dL (ref 6.0–8.5)

## 2018-04-23 LAB — PROTEIN / CREATININE RATIO, URINE
Creatinine, Urine: 83.4 mg/dL
Protein, Ur: 17.4 mg/dL
Protein/Creat Ratio: 209 mg/g creat — ABNORMAL HIGH (ref 0–200)

## 2018-04-24 ENCOUNTER — Observation Stay
Admission: EM | Admit: 2018-04-24 | Discharge: 2018-04-24 | Disposition: A | Discharge status: Home / Self Care | Payer: Managed Care, Other (non HMO) | Attending: Obstetrics and Gynecology | Admitting: Obstetrics and Gynecology

## 2018-04-24 ENCOUNTER — Observation Stay: Payer: Managed Care, Other (non HMO)

## 2018-04-24 ENCOUNTER — Encounter: Payer: Self-pay | Admitting: *Deleted

## 2018-04-24 DIAGNOSIS — Z3A36 36 weeks gestation of pregnancy: Secondary | ICD-10-CM | POA: Diagnosis not present

## 2018-04-24 DIAGNOSIS — O10013 Pre-existing essential hypertension complicating pregnancy, third trimester: Secondary | ICD-10-CM | POA: Diagnosis not present

## 2018-04-24 DIAGNOSIS — Z3490 Encounter for supervision of normal pregnancy, unspecified, unspecified trimester: Secondary | ICD-10-CM

## 2018-04-24 DIAGNOSIS — O403XX0 Polyhydramnios, third trimester, not applicable or unspecified: Secondary | ICD-10-CM | POA: Insufficient documentation

## 2018-04-24 DIAGNOSIS — O479 False labor, unspecified: Secondary | ICD-10-CM

## 2018-04-24 DIAGNOSIS — O4703 False labor before 37 completed weeks of gestation, third trimester: Principal | ICD-10-CM | POA: Insufficient documentation

## 2018-04-24 DIAGNOSIS — O99013 Anemia complicating pregnancy, third trimester: Secondary | ICD-10-CM

## 2018-04-24 DIAGNOSIS — O288 Other abnormal findings on antenatal screening of mother: Secondary | ICD-10-CM

## 2018-04-24 DIAGNOSIS — O0990 Supervision of high risk pregnancy, unspecified, unspecified trimester: Secondary | ICD-10-CM

## 2018-04-24 LAB — COMPREHENSIVE METABOLIC PANEL
ALK PHOS: 129 U/L — AB (ref 38–126)
ALT: 9 U/L (ref 0–44)
ANION GAP: 7 (ref 5–15)
AST: 29 U/L (ref 15–41)
Albumin: 2.8 g/dL — ABNORMAL LOW (ref 3.5–5.0)
BILIRUBIN TOTAL: 0.3 mg/dL (ref 0.3–1.2)
BUN: 10 mg/dL (ref 6–20)
CALCIUM: 9 mg/dL (ref 8.9–10.3)
CO2: 21 mmol/L — ABNORMAL LOW (ref 22–32)
Chloride: 107 mmol/L (ref 98–111)
Creatinine, Ser: 0.42 mg/dL — ABNORMAL LOW (ref 0.44–1.00)
Glucose, Bld: 96 mg/dL (ref 70–99)
Potassium: 3.8 mmol/L (ref 3.5–5.1)
Sodium: 135 mmol/L (ref 135–145)
TOTAL PROTEIN: 6.6 g/dL (ref 6.5–8.1)

## 2018-04-24 LAB — CBC
HCT: 34.3 % — ABNORMAL LOW (ref 35.0–47.0)
Hemoglobin: 11.5 g/dL — ABNORMAL LOW (ref 12.0–16.0)
MCH: 28.3 pg (ref 26.0–34.0)
MCHC: 33.6 g/dL (ref 32.0–36.0)
MCV: 84.2 fL (ref 80.0–100.0)
PLATELETS: 156 10*3/uL (ref 150–440)
RBC: 4.07 MIL/uL (ref 3.80–5.20)
RDW: 21.5 % — ABNORMAL HIGH (ref 11.5–14.5)
WBC: 16.7 10*3/uL — AB (ref 3.6–11.0)

## 2018-04-24 LAB — URINALYSIS, COMPLETE (UACMP) WITH MICROSCOPIC
BILIRUBIN URINE: NEGATIVE
Glucose, UA: NEGATIVE mg/dL
KETONES UR: 5 mg/dL — AB
Nitrite: NEGATIVE
Protein, ur: NEGATIVE mg/dL
SPECIFIC GRAVITY, URINE: 1.013 (ref 1.005–1.030)
pH: 6 (ref 5.0–8.0)

## 2018-04-24 LAB — TYPE AND SCREEN
ABO/RH(D): O POS
Antibody Screen: NEGATIVE

## 2018-04-24 LAB — PROTEIN / CREATININE RATIO, URINE
Creatinine, Urine: 71 mg/dL
PROTEIN CREATININE RATIO: 0.15 mg/mg{creat} (ref 0.00–0.15)
TOTAL PROTEIN, URINE: 11 mg/dL

## 2018-04-24 NOTE — Final Progress Note (Signed)
Physician Final Progress Note  Patient ID: [name] MRN: [id number] DOB/AGE: 1986-04-25 32 y.o.  Admit date: 04/24/2018 Admitting provider: Natale Milchhristanna R Schuman, MD Discharge date: 04/24/2018   Admission Diagnoses: IUP at 36wk 2days with preterm contractions Chronic hypertension Previous Cesarean section Polyhydramnios   Discharge Diagnoses: IUP at 36wk 2days with false labor Chronic hypertension Previous Cesarean section Polyhydramnios  Consults: None  Significant Findings/ Diagnostic Studies: 32 year old G3 P1011 with EDC=05/20/2018 presented last night with painful contractions, mild headache, and blood pressures in the mild range. The contractions have decreased in intensity and frequency. Her cervix has not changed from the cervical exam 2 days ago in the clinic by this provider (1.5/70%/-2). Preeclampsia labs were negative. Blood pressures this AM 133/83 and 138/83, but during this hospitalization the range was 133/83-151/84.  Fetal heart rate strip was Cat 1 and reactive most of the night. BPP 8/8. AFI 32cm Discussed with patient plan of delivery. She still desires TOLAC if she goes into spontaneous labor. Discussed concerns for uterine rupture with induction, in light of her distended uterus with polyhydramnios. Discussed with Dr Bonney AidStaebler moving her repeat Cesarean date up to 38 weeks given her CHTN and blood pressure elevations. Patient is agreeable with this plan. CS scheduled for August 22 with Dr Bonney AidStaebler.   Procedures: BPP  Discharge Condition: stable  Disposition: Discharge disposition: 01-Home or Self Care       Diet: Regular diet  Discharge Activity: Activity as tolerated  Discharge Instructions    Discharge patient   Complete by:  As directed    Discharge disposition:  01-Home or Self Care   Discharge patient date:  04/24/2018     Allergies as of 04/24/2018      Reactions   Amoxicillin Itching   Keflex [cephalexin] Itching      Medication List     STOP taking these medications   nystatin cream Commonly known as:  MYCOSTATIN   pantoprazole 20 MG tablet Commonly known as:  PROTONIX     TAKE these medications   cetirizine 10 MG tablet Commonly known as:  ZYRTEC Take 10 mg by mouth daily.   esomeprazole 10 MG packet Commonly known as:  NEXIUM Take 10 mg by mouth daily before breakfast.   ferrous sulfate 325 (65 FE) MG tablet Commonly known as:  FERROUSUL Take 1 tablet (325 mg total) by mouth 2 (two) times daily with a meal.   prenatal multivitamin Tabs tablet Take 1 tablet by mouth daily.   promethazine 25 MG tablet Commonly known as:  PHENERGAN Take one tablet by mouth every 6 hours as needed for nausea.   sertraline 100 MG tablet Commonly known as:  ZOLOFT TK 1 T PO D FOR ANXIETY        Total time spent taking care of this patient: 20 minutes  Signed: Farrel Connersolleen Vylet Maffia 04/24/2018, 10:20 AM

## 2018-04-24 NOTE — OB Triage Note (Signed)
Recvd pt from ED. Pt c/o contractions/cramping that started yesterday around 1630. Pt states they are 5 min apart. No vaginal bleeding or LOF. Feeling baby move well. Pt took benadryl tonight for itchiness. Was seen on Monday for routine PNV. Rates pain a 6 out of 10.

## 2018-04-24 NOTE — Progress Notes (Signed)
Pt returned from ultrasound. Pt to be seen by midwife, order not to reapply monitors,  Pt given diet menu and after she ate her breakfast was discharged home with instructions by midwife CLG, and myself. Pt. Verbalized understanding of instuctions

## 2018-04-25 ENCOUNTER — Telehealth: Payer: Self-pay | Admitting: Obstetrics and Gynecology

## 2018-04-25 ENCOUNTER — Other Ambulatory Visit: Payer: Managed Care, Other (non HMO)

## 2018-04-25 ENCOUNTER — Encounter: Payer: Managed Care, Other (non HMO) | Admitting: Advanced Practice Midwife

## 2018-04-25 NOTE — Telephone Encounter (Signed)
Lmtrc

## 2018-04-25 NOTE — Telephone Encounter (Signed)
-----   Message from Farrel Connersolleen Gutierrez, [place] sent at 04/24/2018  5:54 PM EDT ----- Regarding: Scheduling surgery Surgery Booking Request Patient Full Name:  [name]  MRN: [id number]  DOB: 10-17-1985  Surgeon: Dr Bonney AidStaebler Requested Surgery Date and Time: 09 May 2018 Primary Diagnosis AND Code: Previous Cesarean section Secondary Diagnosis and Code: Chronic hypertension, Polyhydramnios Surgical Procedure: Cesarean Section L&D Notification: Yes Admission Status: surgery admit Length of Surgery: 1-2 hr Special Case Needs: no H&P:pending Phone Interview???: no Interpreter: no Language:  Medical Clearance: no Special Scheduling Instructions: no

## 2018-04-26 NOTE — Telephone Encounter (Signed)
Patient is aware of [facility] at [facility] on 05/08/18 @ 9:50am w/ Dr. Bonney AidStaebler, Pre-admit Testing afterwards, and OR on 05/09/18. Patient's next appointment confirmed.

## 2018-04-26 NOTE — Telephone Encounter (Signed)
Lmtrc

## 2018-04-28 LAB — STREP GP B SUSCEPTIBILITY

## 2018-04-28 LAB — STREP GP B CULTURE+RFLX: STREP GP B CULTURE+RFLX: POSITIVE — AB

## 2018-04-28 NOTE — Progress Notes (Signed)
HROB/ NST at 36 weeks: CHTN, polyhydramnios, prior Cesarean section Initial BP 158/100, then 136/100. Denies headaches, N/V, CP, RUQ pain. Baby active Home blood pressures: 124/76, 132/87, 142/87, 127/81 NST reactive: Baseline 145 with accelerations to 160 (had to hold transducer on), moderate variability Repeat CBC, CMP and PC ratio today.  Repeat NST and AFI in 3 days Will call with lab results  Farrel Connersolleen Lamarr Feenstra, [name]

## 2018-04-29 ENCOUNTER — Ambulatory Visit (INDEPENDENT_AMBULATORY_CARE_PROVIDER_SITE_OTHER): Payer: Managed Care, Other (non HMO) | Admitting: Maternal Newborn

## 2018-04-29 ENCOUNTER — Encounter: Payer: Self-pay | Admitting: Maternal Newborn

## 2018-04-29 VITALS — BP 142/94 | HR 116 | Wt 211.0 lb

## 2018-04-29 DIAGNOSIS — O10919 Unspecified pre-existing hypertension complicating pregnancy, unspecified trimester: Secondary | ICD-10-CM

## 2018-04-29 DIAGNOSIS — O10913 Unspecified pre-existing hypertension complicating pregnancy, third trimester: Secondary | ICD-10-CM

## 2018-04-29 DIAGNOSIS — O403XX0 Polyhydramnios, third trimester, not applicable or unspecified: Secondary | ICD-10-CM | POA: Diagnosis not present

## 2018-04-29 DIAGNOSIS — Z3A37 37 weeks gestation of pregnancy: Secondary | ICD-10-CM | POA: Diagnosis not present

## 2018-04-29 DIAGNOSIS — O09293 Supervision of pregnancy with other poor reproductive or obstetric history, third trimester: Secondary | ICD-10-CM | POA: Diagnosis not present

## 2018-04-29 DIAGNOSIS — O0990 Supervision of high risk pregnancy, unspecified, unspecified trimester: Secondary | ICD-10-CM

## 2018-04-29 LAB — FETAL NONSTRESS TEST

## 2018-04-29 LAB — POCT URINALYSIS DIPSTICK OB: GLUCOSE, UA: NEGATIVE — AB

## 2018-04-29 NOTE — Progress Notes (Signed)
Routine Prenatal Care Visit  Subjective  [name] is a 32 y.o. G3P1011 at 3172w0d being seen today for ongoing prenatal care.  She is currently monitored for the following issues for this high-risk pregnancy and has Supervision of high risk pregnancy, antepartum; History of gestational hypertension; History of cesarean delivery, currently pregnant; Hiatal hernia; Mitral valve disorder; Palpitations; Scoliosis deformity of spine; Chronic hypertension during pregnancy, antepartum; Anxiety; Anemia in pregnancy, third trimester; Polyhydramnios affecting pregnancy in third trimester; Hematuria; and Pregnancy on their problem list.  ----------------------------------------------------------------------------------- Patient reports heartburn.   Contractions: Irritability. Vag. Bleeding: None.  Movement: Present. No leaking of fluid.  ----------------------------------------------------------------------------------- The following portions of the patient's history were reviewed and updated as appropriate: allergies, current medications, past family history, past medical history, past social history, past surgical history and problem list. Problem list updated.   Objective  Blood pressure (!) 142/94, pulse (!) 116, weight 211 lb (95.7 kg), last menstrual period 08/13/2017. Pregravid weight 168 lb (76.2 kg) Total Weight Gain 43 lb (19.5 kg) Body mass index is 37.38 kg/m. Urinalysis: Protein Trace, Glucose Negative Fetal Status: Fetal Heart Rate (bpm): 145 Fundal Height: 40 cm Movement: Present     General:  Alert, oriented and cooperative. Patient is in no acute distress.  Skin: Skin is warm and dry. No rash noted.   Cardiovascular: Normal heart rate noted  Respiratory: Normal respiratory effort, no problems with respiration noted  Abdomen: Soft, gravid, appropriate for gestational age. Pain/Pressure: Present     Pelvic:  Cervical exam deferred        Extremities: Normal range of motion.   Edema: Trace  Mental Status: Normal mood and affect. Normal behavior. Normal judgment and thought content.   NST Baseline: 145 Variability: moderate Accelerations: present, lots of fetal movement and did not always show on tracing, but multiple 15 second accelerations to mid-upper 160s observed in room. Decelerations: absent Tocometry: not done The patient was monitored for 20+ minutes, fetal heart rate tracing was deemed reactive.  Assessment   32 y.o. G3P1011 at 572w0d, EDD 05/20/2018 by Last Menstrual Period presenting for routine prenatal visit.  Plan   third Problems (from 09/25/17 to present)    Problem Noted Resolved   Anemia in pregnancy, third trimester 03/13/2018 by [name], [name], [name] No   Supervision of high risk pregnancy, antepartum 09/25/2017 by [name], [name], [name] No   Overview Addendum 04/29/2018  9:23 AM by [name], Colleen, [name]    [facility]inic [facility] Prenatal Labs  Dating LMP Blood type: O/Positive/-- (01/08 1100)   Genetic Screen 1 Screen:    AFP:     Quad:     NIPS: Antibody:Negative (01/08 1100)  Anatomic US  Rubella: 1.45 (01/08 1100) Varicella:    GTT Early:               Third trimester:  RPR: Non Reactive (01/08 1100)   Rhogam  HBsAg: Negative (01/08 1100)   TDaP vaccine        04/12/18               Flu Shot: HIV: Non Reactive (01/08 1100)   Baby Food                                GBS: positive, resistant to clinda  Contraception  Pap:  CBB     CS/VBAC    Support Person  Home BP log: 141/91 and 135/86.   Term labor symptoms and general obstetric precautions including but not limited to vaginal bleeding, contractions, leaking of fluid and fetal movement were reviewed.  Return in about 3 days (around 05/02/2018) for ROB with NST/AFI.  Marcelyn BruinsJacelyn Rollen Selders, [name] 04/29/2018  11:57 AM

## 2018-05-02 ENCOUNTER — Ambulatory Visit (INDEPENDENT_AMBULATORY_CARE_PROVIDER_SITE_OTHER): Payer: Managed Care, Other (non HMO) | Admitting: Obstetrics & Gynecology

## 2018-05-02 ENCOUNTER — Ambulatory Visit (INDEPENDENT_AMBULATORY_CARE_PROVIDER_SITE_OTHER): Payer: Managed Care, Other (non HMO)

## 2018-05-02 VITALS — BP 130/80 | Wt 212.0 lb

## 2018-05-02 DIAGNOSIS — O09293 Supervision of pregnancy with other poor reproductive or obstetric history, third trimester: Secondary | ICD-10-CM | POA: Diagnosis not present

## 2018-05-02 DIAGNOSIS — Z3A37 37 weeks gestation of pregnancy: Secondary | ICD-10-CM

## 2018-05-02 DIAGNOSIS — O403XX0 Polyhydramnios, third trimester, not applicable or unspecified: Secondary | ICD-10-CM | POA: Diagnosis not present

## 2018-05-02 DIAGNOSIS — Z362 Encounter for other antenatal screening follow-up: Secondary | ICD-10-CM

## 2018-05-02 DIAGNOSIS — Z23 Encounter for immunization: Secondary | ICD-10-CM

## 2018-05-02 DIAGNOSIS — Z8759 Personal history of other complications of pregnancy, childbirth and the puerperium: Secondary | ICD-10-CM

## 2018-05-02 DIAGNOSIS — O0990 Supervision of high risk pregnancy, unspecified, unspecified trimester: Secondary | ICD-10-CM

## 2018-05-02 DIAGNOSIS — Z369 Encounter for antenatal screening, unspecified: Secondary | ICD-10-CM

## 2018-05-02 LAB — POCT URINALYSIS DIPSTICK OB
Glucose, UA: NEGATIVE — AB
PROTEIN: NEGATIVE

## 2018-05-02 NOTE — Patient Instructions (Signed)
Cesarean Delivery Cesarean birth, or cesarean delivery, is the surgical delivery of a baby through an incision in the abdomen and the uterus. This may be referred to as a C-section. This procedure may be scheduled ahead of time, or it may be done in an emergency situation. Tell a health care provider about:  Any allergies you have.  All medicines you are taking, including vitamins, herbs, eye drops, creams, and over-the-counter medicines.  Any problems you or family members have had with anesthetic medicines.  Any blood disorders you have.  Any surgeries you have had.  Any medical conditions you have.  Whether you or any members of your family have a history of deep vein thrombosis (DVT) or pulmonary embolism (PE). What are the risks? Generally, this is a safe procedure. However, problems may occur, including:  Infection.  Bleeding.  Allergic reactions to medicines.  Damage to other structures or organs.  Blood clots.  Injury to your baby.  What happens before the procedure?  Follow instructions from your health care provider about eating or drinking restrictions.  Follow instructions from your health care provider about bathing before your procedure to help reduce your risk of infection.  If you know that you are going to have a cesarean delivery, do not shave your pubic area. Shaving before the procedure may increase your risk of infection.  Ask your health care provider about: ? Changing or stopping your regular medicines. This is especially important if you are taking diabetes medicines or blood thinners. ? Your pain management plan. This is especially important if you plan to breastfeed your baby. ? How long you will be in the hospital after the procedure. ? Any concerns you may have about receiving blood products if you need them during the procedure. ? Cord blood banking, if you plan to collect your baby's umbilical cord blood.  You may also want to ask your  health care provider: ? Whether you will be able to hold or breastfeed your baby while you are still in the operating room. ? Whether your baby can stay with you immediately after the procedure and during your recovery. ? Whether a family member or a person of your choice can go with you into the operating room and stay with you during the procedure, immediately after the procedure, and during your recovery.  Plan to have someone drive you home when you are discharged from the hospital. What happens during the procedure?  Fetal monitors will be placed on your abdomen to monitor your heart rate and your baby's heart rate.  Depending on the reason for your cesarean delivery, you may have a physical exam or additional testing, such as an ultrasound.  An IV tube will be inserted into one of your veins.  You may have your blood or urine tested.  You will be given antibiotic medicine to help prevent infection.  You may be given a special warming gown to wear to keep your temperature stable.  Hair may be removed from your pubic area.  The skin of your pubic area and lower abdomen will be cleaned with a germ-killing solution (antiseptic).  A catheter may be inserted into your bladder through your urethra. This drains your urine during the procedure.  You may be given one or more of the following: ? A medicine to numb the area (local anesthetic). ? A medicine to make you fall asleep (general anesthetic). ? A medicine (regional anesthetic) that is injected into your back or through a small   thin tube placed in your back (spinal anesthetic or epidural anesthetic). This numbs everything below the injection site and allows you to stay awake during your procedure. If this makes you feel nauseous, tell your health care provider. Medicines will be available to help reduce any nausea you may feel.  An incision will be made in your abdomen, and then in your uterus.  If you are awake during your  procedure, you may feel tugging and pulling in your abdomen, but you should not feel pain. If you feel pain, tell your health care provider immediately.  Your baby will be removed from your uterus. You may feel more pressure or pushing while this happens.  Immediately after birth, your baby will be dried and kept warm. You may be able to hold and breastfeed your baby. The umbilical cord may be clamped and cut during this time.  Your placenta will be removed from your uterus.  Your incisions will be closed with stitches (sutures). Staples, skin glue, or adhesive strips may also be applied to the incision in your abdomen.  Bandages (dressings) will be placed over the incision in your abdomen. The procedure may vary among health care providers and hospitals. What happens after the procedure?  Your blood pressure, heart rate, breathing rate, and blood oxygen level will be monitored often until the medicines you were given have worn off.  You may continue to receive fluids and medicines through an IV tube.  You will have some pain. Medicines will be available to help control your pain.  To help prevent blood clots: ? You may be given medicines. ? You may have to wear compression stockings or devices. ? You will be encouraged to walk around when you are able.  Hospital staff will encourage and support bonding with your baby. Your hospital may allow you and your baby to stay in the same room (rooming in) during your hospital stay to encourage successful breastfeeding.  You may be encouraged to cough and breathe deeply often. This helps to prevent lung problems.  If you have a catheter draining your urine, it will be removed as soon as possible after your procedure. This information is not intended to replace advice given to you by your health care provider. Make sure you discuss any questions you have with your health care provider. Document Released: 09/04/2005 Document Revised: 02/10/2016  Document Reviewed: 06/15/2015 Elsevier Interactive Patient Education  2018 Elsevier Inc.  

## 2018-05-02 NOTE — Progress Notes (Signed)
  Subjective  Fetal Movement? yes Contractions? no Leaking Fluid? no Vaginal Bleeding? no  Objective  BP 130/80   Wt 212 lb (96.2 kg)   LMP 08/13/2017   BMI 37.55 kg/m  General: NAD Pumonary: no increased work of breathing Abdomen: gravid, non-tender Extremities: no edema Psychiatric: mood appropriate, affect full  Assessment  32 y.o. G3P1011 at 6428w3d by  05/20/2018, by Last Menstrual Period presenting for routine prenatal visit  Plan   Problem List Items Addressed This Visit      Other   Supervision of high risk pregnancy, antepartum   History of gestational hypertension   Polyhydramnios affecting pregnancy in third trimester   Pregnancy - Primary   Relevant Orders   POC Urinalysis Dipstick OB (Completed)     [facility] Prenatal Labs  Dating LMP Blood type: O/Positive/-- (01/08 1100)   Genetic Screen 1 Screen:    AFP:     Quad:     NIPS: Antibody:Negative (01/08 1100)  Anatomic US done Rubella: 1.45 (01/08 1100) Varicella:    GTT  Third trimester:normal  RPR: Non Reactive (01/08 1100)   Rhogam na HBsAg: Negative (01/08 1100)   TDaP vaccine  04/12/18        Flu Shot:8/15 HIV: Non Reactive (01/08 1100)   Baby Food               Breast                 GBS: positive, resistant to clinda  Contraception  Pap:  CBB     CS/VBAC 8/22 scheduled   Support Person husband   A NST procedure was performed with FHR monitoring and a normal baseline established, appropriate time of 40 minutes of evaluation, and not having accels >2 seen w 15x15 characteristics.  Results show a NON-REACTIVE NST.  Due to good FM at other times, and normal US, will repeat NST in am.  Can plan OCT if still flat.  Review of ULTRASOUND.    I have personally reviewed images and [facility].    Plan of management to be discussed with patient.  Flu shot today  Annamarie MajorPaul Vernal Hritz, MD, Merlinda FrederickFACOG [facility], [facility] 05/02/2018  3:03 PM

## 2018-05-03 ENCOUNTER — Ambulatory Visit (INDEPENDENT_AMBULATORY_CARE_PROVIDER_SITE_OTHER): Payer: Managed Care, Other (non HMO) | Admitting: Obstetrics & Gynecology

## 2018-05-03 VITALS — BP 150/90 | Wt 212.0 lb

## 2018-05-03 DIAGNOSIS — O10913 Unspecified pre-existing hypertension complicating pregnancy, third trimester: Secondary | ICD-10-CM

## 2018-05-03 DIAGNOSIS — Z3A37 37 weeks gestation of pregnancy: Secondary | ICD-10-CM | POA: Diagnosis not present

## 2018-05-03 DIAGNOSIS — O10919 Unspecified pre-existing hypertension complicating pregnancy, unspecified trimester: Secondary | ICD-10-CM

## 2018-05-03 NOTE — Progress Notes (Signed)
A NST procedure was performed with FHR monitoring and a normal baseline established, appropriate time of 20-40 minutes of evaluation, and accels >2 seen w 15x15 characteristics.  Results show a REACTIVE NST.   Annamarie MajorPaul Koree Staheli, MD, Merlinda FrederickFACOG [facility], [facility] 05/03/2018  10:47 AM

## 2018-05-03 NOTE — Telephone Encounter (Signed)
Patient called and advised of normal lab results from yesterday. [name], [name]

## 2018-05-06 ENCOUNTER — Encounter: Payer: Self-pay | Admitting: Obstetrics and Gynecology

## 2018-05-06 ENCOUNTER — Ambulatory Visit (INDEPENDENT_AMBULATORY_CARE_PROVIDER_SITE_OTHER): Payer: Managed Care, Other (non HMO) | Admitting: Obstetrics and Gynecology

## 2018-05-06 VITALS — BP 138/96 | Wt 215.0 lb

## 2018-05-06 DIAGNOSIS — Z3A38 38 weeks gestation of pregnancy: Secondary | ICD-10-CM

## 2018-05-06 DIAGNOSIS — O34219 Maternal care for unspecified type scar from previous cesarean delivery: Secondary | ICD-10-CM | POA: Diagnosis not present

## 2018-05-06 DIAGNOSIS — O403XX0 Polyhydramnios, third trimester, not applicable or unspecified: Secondary | ICD-10-CM | POA: Diagnosis not present

## 2018-05-06 DIAGNOSIS — O99013 Anemia complicating pregnancy, third trimester: Secondary | ICD-10-CM | POA: Diagnosis not present

## 2018-05-06 DIAGNOSIS — O10913 Unspecified pre-existing hypertension complicating pregnancy, third trimester: Secondary | ICD-10-CM

## 2018-05-06 DIAGNOSIS — O0990 Supervision of high risk pregnancy, unspecified, unspecified trimester: Secondary | ICD-10-CM

## 2018-05-06 DIAGNOSIS — O10919 Unspecified pre-existing hypertension complicating pregnancy, unspecified trimester: Secondary | ICD-10-CM

## 2018-05-06 LAB — POCT URINALYSIS DIPSTICK OB: Glucose, UA: NEGATIVE — AB

## 2018-05-06 LAB — FETAL NONSTRESS TEST

## 2018-05-06 NOTE — Progress Notes (Signed)
Routine Prenatal Care Visit  Subjective  [name] is a 32 y.o. G3P1011 at 4062w0d being seen today for ongoing prenatal care.  She is currently monitored for the following issues for this high-risk pregnancy and has Supervision of high risk pregnancy, antepartum; History of gestational hypertension; History of cesarean delivery, currently pregnant; Hiatal hernia; Mitral valve disorder; Palpitations; Scoliosis deformity of spine; Chronic hypertension during pregnancy, antepartum; Anxiety; Anemia in pregnancy, third trimester; Polyhydramnios affecting pregnancy in third trimester; Hematuria; and Pregnancy on their problem list.  ----------------------------------------------------------------------------------- Patient reports no complaints.  Denies HA's, visual changes, and RUQ pain. At-home BPs 130s/90s.   Contractions: Irritability. Vag. Bleeding: None.  Movement: Present. Denies leaking of fluid.  ----------------------------------------------------------------------------------- The following portions of the patient's history were reviewed and updated as appropriate: allergies, current medications, past family history, past medical history, past social history, past surgical history and problem list. Problem list updated.  Objective  Blood pressure (!) 138/96, weight 215 lb (97.5 kg), last menstrual period 08/13/2017, currently breastfeeding. Pregravid weight 168 lb (76.2 kg) Total Weight Gain 47 lb (21.3 kg) Urinalysis:      Fetal Status: Fetal Heart Rate (bpm): 140   Movement: Present     General:  Alert, oriented and cooperative. Patient is in no acute distress.  Skin: Skin is warm and dry. No rash noted.   Cardiovascular: Normal heart rate noted  Respiratory: Normal respiratory effort, no problems with respiration noted  Abdomen: Soft, gravid, appropriate for gestational age. Pain/Pressure: Absent     Pelvic:  Cervical exam deferred        Extremities: Normal range of motion.   Edema: None  Mental Status: Normal mood and affect. Normal behavior. Normal judgment and thought content.   NST Baseline FHR: 140 beats/min Variability: moderate Accelerations: present Decelerations: absent Tocometry: not done  Interpretation:  INDICATIONS: chronic hypertension RESULTS:  A NST procedure was performed with FHR monitoring and a normal baseline established, appropriate time of 20-40 minutes of evaluation, and accels >2 seen w 15x15 characteristics.  Results show a REACTIVE NST.    Assessment   32 y.o. G3P1011 at 4662w0d by  05/20/2018, by Last Menstrual Period presenting for routine prenatal visit  Plan   third Problems (from 09/25/17 to present)    Problem Noted Resolved   Anemia in pregnancy, third trimester 03/13/2018 by [name], [name], [name] No   Supervision of high risk pregnancy, antepartum 09/25/2017 by [name], [name], [name] No   Overview Addendum 05/02/2018  3:03 PM by Nadara MustardHarris, Robert P, MD    [facility] Prenatal Labs  Dating LMP Blood type: O/Positive/-- (01/08 1100)   Genetic Screen 1 Screen:    AFP:     Quad:     NIPS: Antibody:Negative (01/08 1100)  Anatomic US  Rubella: 1.45 (01/08 1100) Varicella:    GTT Early:               Third trimester:  RPR: Non Reactive (01/08 1100)   Rhogam  HBsAg: Negative (01/08 1100)   TDaP vaccine  04/12/18        Flu Shot:8/15 HIV: Non Reactive (01/08 1100)   Baby Food               Breast                 GBS: positive, resistant to clinda  Contraception  Pap:  CBB     CS/VBAC 8/22 scheduled   Support Person husband  Term labor symptoms and general obstetric precautions including but not limited to vaginal bleeding, contractions, leaking of fluid and fetal movement were reviewed in detail with the patient. Please refer to After Visit Summary for other counseling recommendations.   Return in 2 days (on 05/08/2018) for Keep previously schedule appt in 2 days.  Thomasene MohairStephen Jackson, MD, Merlinda FrederickFACOG [facility], [facility] 05/06/2018 11:00 AM

## 2018-05-08 ENCOUNTER — Encounter
Admission: RE | Admit: 2018-05-08 | Discharge: 2018-05-08 | Disposition: A | Discharge status: Home / Self Care | Payer: Managed Care, Other (non HMO) | Source: Ambulatory Visit | Attending: Obstetrics and Gynecology | Admitting: Obstetrics and Gynecology

## 2018-05-08 ENCOUNTER — Ambulatory Visit (INDEPENDENT_AMBULATORY_CARE_PROVIDER_SITE_OTHER): Payer: Managed Care, Other (non HMO) | Admitting: Obstetrics and Gynecology

## 2018-05-08 ENCOUNTER — Other Ambulatory Visit: Payer: Self-pay

## 2018-05-08 ENCOUNTER — Encounter: Payer: Self-pay | Admitting: Obstetrics and Gynecology

## 2018-05-08 VITALS — BP 130/90 | HR 124 | Ht 63.0 in | Wt 214.0 lb

## 2018-05-08 DIAGNOSIS — O34219 Maternal care for unspecified type scar from previous cesarean delivery: Secondary | ICD-10-CM

## 2018-05-08 DIAGNOSIS — O0990 Supervision of high risk pregnancy, unspecified, unspecified trimester: Secondary | ICD-10-CM

## 2018-05-08 DIAGNOSIS — O403XX0 Polyhydramnios, third trimester, not applicable or unspecified: Secondary | ICD-10-CM

## 2018-05-08 DIAGNOSIS — Z3A38 38 weeks gestation of pregnancy: Secondary | ICD-10-CM

## 2018-05-08 DIAGNOSIS — O10919 Unspecified pre-existing hypertension complicating pregnancy, unspecified trimester: Secondary | ICD-10-CM

## 2018-05-08 HISTORY — DX: Essential (primary) hypertension: I10

## 2018-05-08 HISTORY — DX: Personal history of other diseases of the digestive system: Z87.19

## 2018-05-08 LAB — COMPREHENSIVE METABOLIC PANEL
ALT: 10 U/L (ref 0–44)
ANION GAP: 10 (ref 5–15)
AST: 19 U/L (ref 15–41)
Albumin: 3 g/dL — ABNORMAL LOW (ref 3.5–5.0)
Alkaline Phosphatase: 150 U/L — ABNORMAL HIGH (ref 38–126)
BILIRUBIN TOTAL: 0.4 mg/dL (ref 0.3–1.2)
BUN: 8 mg/dL (ref 6–20)
CHLORIDE: 104 mmol/L (ref 98–111)
CO2: 22 mmol/L (ref 22–32)
Calcium: 9 mg/dL (ref 8.9–10.3)
Creatinine, Ser: 0.44 mg/dL (ref 0.44–1.00)
GFR calc Af Amer: >60 mL/min (ref >60)
Glucose, Bld: 77 mg/dL (ref 70–99)
POTASSIUM: 4.1 mmol/L (ref 3.5–5.1)
Sodium: 136 mmol/L (ref 135–145)
TOTAL PROTEIN: 6.8 g/dL (ref 6.5–8.1)

## 2018-05-08 LAB — CBC
HCT: 37.4 % (ref 35.0–47.0)
Hemoglobin: 12.7 g/dL (ref 12.0–16.0)
MCH: 29.4 pg (ref 26.0–34.0)
MCHC: 33.9 g/dL (ref 32.0–36.0)
MCV: 86.8 fL (ref 80.0–100.0)
PLATELETS: 156 10*3/uL (ref 150–440)
RBC: 4.31 MIL/uL (ref 3.80–5.20)
RDW: 21.9 % — AB (ref 11.5–14.5)
WBC: 15 10*3/uL — AB (ref 3.6–11.0)

## 2018-05-08 NOTE — Patient Instructions (Signed)
Your procedure is scheduled on: 05/09/18  O730 AM Report to [facility]. .  Remember: Instructions that are not followed completely may result in serious medical risk,  up to and including death, or upon the discretion of your surgeon and anesthesiologist your  surgery may need to be rescheduled.     _X__ 1. Do not eat food after midnight the night before your procedure.                 No gum chewing or hard candies. You may drink clear liquids up to 2 hours                 before you are scheduled to arrive for your surgery- DO not drink clear                 liquids within 2 hours of the start of your surgery.                 Clear Liquids include:  water, apple juice without pulp, clear carbohydrate                 drink such as Clearfast of Gatorade, Black Coffee or Tea (Do not add                 anything to coffee or tea).  __X__2.  On the morning of surgery brush your teeth with toothpaste and water, you                may rinse your mouth with mouthwash if you wish.  Do not swallow any toothpaste of mouthwash.     _X__ 3.  No Alcohol for 24 hours before or after surgery.   _X__ 4.  Do Not Smoke or use e-cigarettes For 24 Hours Prior to Your Surgery.                 Do not use any chewable tobacco products for at least 6 hours prior to                 surgery.  ____  5.  Bring all medications with you on the day of surgery if instructed.   ____  6.  Notify your doctor if there is any change in your medical condition      (cold, fever, infections).     Do not wear jewelry, make-up, hairpins, clips or nail polish. Do not wear lotions, powders, or perfumes. You may wear deodorant. Do not shave 48 hours prior to surgery. Men may shave face and neck. Do not bring valuables to the hospital.    [facility] any belongings or valuables.  Contacts, dentures or bridgework may not be worn into surgery. Leave your  suitcase in the car. After surgery it may be brought to your room. For patients admitted to the hospital, discharge time is determined by your treatment team.   Patients discharged the day of surgery will not be allowed to drive home.   Please read over the following fact sheets that you were given:   Surgical Site Infection Prevention          ____ Take these medicines the morning of surgery with A SIP OF WATER:    1. NEXIUM AT BEDTIME 05/08/18 AND AM OF SURGERY  2.   3.   4.  5.  6.  ____ Fleet Enema (as directed)   _X___ Use  SAGE WIPES as directed  ____ Use inhalers on the day of surgery  ____ Stop metformin 2 days prior to surgery    ____ Take 1/2 of usual insulin dose the night before surgery. No insulin the morning          of surgery.   ____ Stop Coumadin/Plavix/aspirin on   ____ Stop Anti-inflammatories on    ____ Stop supplements until after surgery.    ____ Bring C-Pap to the hospital.

## 2018-05-08 NOTE — Progress Notes (Signed)
Obstetric H&P   Chief Complaint: Chronic HTN, polyhydramnios  Prenatal Care Provider: [facility]  History of Present Illness: 32 y.o. G3P1011 3634w2d by 05/20/2018, by Last Menstrual Period presenting for preop H&P,  +FM, no LOF, no VB, no ctx.    [facility]medicated, polyhydramnios (fetal stomach imaged normally), and history of prior LTCS.  Pregravid weight 168 lb (76.2 kg) Total Weight Gain 47 lb (21.3 kg)  third Problems (from 09/25/17 to present)    Problem Noted Resolved   Anemia in pregnancy, third trimester 03/13/2018 by [name], [name], [name] No   Supervision of high risk pregnancy, antepartum 09/25/2017 by [name], [name], [name] No   Overview Addendum 05/02/2018  3:03 PM by Nadara MustardHarris, Robert P, MD    Clinic [facility] Prenatal Labs  Dating LMP = 7 week US Blood type: O/Positive/-- (01/08 1100)   Genetic Screen None Antibody:Negative (01/08 1100)  Anatomic US Normal Female Rubella: 1.45 (01/08 1100) Varicella:    GTT 110  RPR: Non Reactive (01/08 1100)   Rhogam N/A HBsAg: Negative (01/08 1100)   TDaP vaccine  04/12/18        Flu Shot:8/15 HIV: Non Reactive (01/08 1100)   Baby Food Breast                 GBS: positive, resistant to clinda  Contraception  Pap: 09/08/2013 NIL needs updated postpartum  CBB     CS/VBAC 8/22 scheduled   Support Person Husband Jomarie LongsJoseph                 Review of Systems: 10 point review of systems negative unless otherwise noted in HPI  Past Medical History: Past Medical History:  Diagnosis Date  . Anxiety   . Hiatal hernia   . 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 Palpitations   . Scoliosis   . Trouble in sleeping     Past Surgical History: Past Surgical History:  Procedure Laterality Date  . CESAREAN SECTION N/A 03/04/2013   Procedure: CESAREAN SECTION;  Surgeon: Bing Plumehomas F Henley, MD;  Location: [facility];  Service: Obstetrics;  Laterality: N/A;    Past Obstetric History: #: 1, Date: 03/04/13, Sex: Female, Weight: 7 lb 10.9 oz  (3.484 kg), GA: 952w4d, Delivery: C-Section, Low Transverse, Apgar1: 7, Apgar5: 9, Living: Living, Birth Comments: None  #: 2, Date: 07/2017, Sex: None, Weight: None, GA: None, Delivery: None, Apgar1: None, Apgar5: None, Living: None, Birth Comments: chemical preg  #: 3, Date: None, Sex: None, Weight: None, GA: None, Delivery: None, Apgar1: None, Apgar5: None, Living: None, Birth Comments: None   Past Gynecologic History:  Family History: Family History  Problem Relation Age of Onset  . Diabetes Mother   . Hypertension Mother   . Kidney disease Mother   . Hypertension Father     Social History: Social History   Socioeconomic History  . Marital status: Married    Spouse name: Not on file  . Number of children: 1  . Years of education: 4818  . Highest education level: Not on file  Occupational History  . Occupation: self employed  Social Needs  . Financial resource strain: Not on file  . Food insecurity:    Worry: Not on file    Inability: Not on file  . Transportation needs:    Medical: Not on file    Non-medical: Not on file  Tobacco Use  . Smoking status: Never Smoker  . Smokeless tobacco: Never Used  Substance and  Sexual Activity  . Alcohol use: No  . Drug use: No  . Sexual activity: Yes    Birth control/protection: None  Lifestyle  . Physical activity:    Days per week: Not on file    Minutes per session: Not on file  . Stress: Not on file  Relationships  . Social connections:    Talks on phone: Not on file    Gets together: Not on file    Attends religious service: Not on file    Active member of club or organization: Not on file    Attends meetings of clubs or organizations: Not on file    Relationship status: Not on file  . Intimate partner violence:    Fear of current or ex partner: Not on file    Emotionally abused: Not on file    Physically abused: Not on file    Forced sexual activity: Not on file  Other Topics Concern  . Not on file  Social  History Narrative  . Not on file    Medications: Prior to Admission medications   Medication Sig Start Date End Date Taking? Authorizing Provider  acetaminophen (TYLENOL) 500 MG tablet Take 1,000 mg by mouth every 6 (six) hours as needed (for pain).   Yes [provider]  cetirizine (ZYRTEC) 10 MG tablet Take 10 mg by mouth every evening.    Yes [provider]  esomeprazole (NEXIUM) 20 MG capsule Take 20 mg by mouth every evening.   Yes [provider]  ferrous sulfate (FERROUSUL) 325 (65 FE) MG tablet Take 1 tablet (325 mg total) by mouth 2 (two) times daily with a meal. Patient taking differently: Take 325 mg by mouth every evening.  03/13/18  Yes [name], [name], [name]  Prenatal Vit-Fe Fumarate-FA (PRENATAL MULTIVITAMIN) TABS Take 1 tablet by mouth every evening.    Yes [provider]  promethazine (PHENERGAN) 25 MG tablet Take one tablet by mouth every 6 hours as needed for nausea. Patient taking differently: Take 25 mg by mouth daily.  04/18/18  Yes [name], [name], [name]  sertraline (ZOLOFT) 100 MG tablet Take 100 mg by mouth every evening.  10/14/17  Yes [provider]    Allergies: Allergies  Allergen Reactions  . Amoxicillin Itching    Has patient had a PCN reaction causing immediate rash, facial/tongue/throat swelling, SOB or lightheadedness with hypotension: No Has patient had a PCN reaction causing severe rash involving mucus membranes or skin necrosis: No Has patient had a PCN reaction that required hospitalization: No Has patient had a PCN reaction occurring within the last 10 years: No If all of the above answers are "NO", then may proceed with Cephalosporin use.   Marland Kitchen. Keflex [Cephalexin] Itching    Physical Exam: Vitals: Blood pressure 130/90, pulse (!) 124, height 5\' 3"  (1.6 m), weight 214 lb (97.1 kg), last menstrual period 08/13/2017, currently breastfeeding.  Urine Dip Protein: NA  FHT: 140  General: NAD HEENT:  normocephalic, anicteric Pulmonary: No increased work of breathing Cardiovascular: RRR, distal pulses 2+ Abdomen: Gravid, non-tender Leopolds: vtx Genitourinary: deferred Extremities: no edema, erythema, or tenderness Neurologic: Grossly intact Psychiatric: mood appropriate, affect full  Labs: No results found for this or any previous visit (from the past 24 hour(s)).  Assessment: 32 y.o. G3P1011 420w2d by 05/20/2018, by Last Menstrual Period presenting for repeat cesarean section in setting of CHTN and polyhydramnios   Plan: 1) The patient was counseled regarding risk and benefits to proceeding with Cesarean  section to expedite delivery.  Risk of cesarean section were discussed including risk of bleeding and need for potential intraoperative or postoperative blood transfusion with a rate of approximately 5% quoted for all Cesarean sections, risk of injury to adjacent organs including but not limited to bowl and bladder, the need for additional surgical procedures to address such injuries, and the risk of infection.    2) Fetus - + FHT  3) PNL - Blood type --/--/O POS (08/07 0252) / Anti-bodyscreen NEG (08/07 0252) / Rubella 1.45 (01/08 1100) / Varicella Immune / RPR Non Reactive (06/17 1012) / HBsAg Negative (01/08 1100) / HIV Non Reactive (06/17 1012) / 1-hr OGTT 110 / GBS    4) Immunization History -  Immunization History  Administered Date(s) Administered  . Influenza,inj,Quad PF,6+ Mos 05/02/2018  . Influenza-Unspecified 06/12/2011  . Tdap 06/12/2011, 12/05/2012, 04/12/2018    5) Disposition - Cesarean delivery tomorrow  [name], MD, [facility], [facility] 05/08/2018, 10:00 AM

## 2018-05-09 ENCOUNTER — Other Ambulatory Visit: Payer: Managed Care, Other (non HMO)

## 2018-05-09 ENCOUNTER — Observation Stay: Payer: Managed Care, Other (non HMO) | Admitting: Anesthesiology

## 2018-05-09 ENCOUNTER — Encounter: Payer: Managed Care, Other (non HMO) | Admitting: Obstetrics and Gynecology

## 2018-05-09 ENCOUNTER — Encounter
Admission: RE | Disposition: A | Discharge status: Home / Self Care | Payer: Self-pay | Source: Home / Self Care | Attending: Certified Nurse Midwife

## 2018-05-09 ENCOUNTER — Other Ambulatory Visit: Payer: Self-pay

## 2018-05-09 ENCOUNTER — Inpatient Hospital Stay
Admission: RE | Admit: 2018-05-09 | Discharge: 2018-05-11 | DRG: 787 | Disposition: A | Discharge status: Home / Self Care | Payer: Managed Care, Other (non HMO) | Attending: Certified Nurse Midwife | Admitting: Certified Nurse Midwife

## 2018-05-09 DIAGNOSIS — O34211 Maternal care for low transverse scar from previous cesarean delivery: Secondary | ICD-10-CM | POA: Diagnosis present

## 2018-05-09 DIAGNOSIS — O1002 Pre-existing essential hypertension complicating childbirth: Secondary | ICD-10-CM | POA: Diagnosis present

## 2018-05-09 DIAGNOSIS — Z880 Allergy status to penicillin: Secondary | ICD-10-CM

## 2018-05-09 DIAGNOSIS — O99013 Anemia complicating pregnancy, third trimester: Secondary | ICD-10-CM

## 2018-05-09 DIAGNOSIS — D62 Acute posthemorrhagic anemia: Secondary | ICD-10-CM | POA: Diagnosis not present

## 2018-05-09 DIAGNOSIS — O9081 Anemia of the puerperium: Secondary | ICD-10-CM | POA: Diagnosis not present

## 2018-05-09 DIAGNOSIS — O34219 Maternal care for unspecified type scar from previous cesarean delivery: Secondary | ICD-10-CM

## 2018-05-09 DIAGNOSIS — O0990 Supervision of high risk pregnancy, unspecified, unspecified trimester: Secondary | ICD-10-CM

## 2018-05-09 DIAGNOSIS — O403XX0 Polyhydramnios, third trimester, not applicable or unspecified: Secondary | ICD-10-CM | POA: Diagnosis present

## 2018-05-09 DIAGNOSIS — Z3A38 38 weeks gestation of pregnancy: Secondary | ICD-10-CM

## 2018-05-09 DIAGNOSIS — O1092 Unspecified pre-existing hypertension complicating childbirth: Secondary | ICD-10-CM

## 2018-05-09 DIAGNOSIS — O99824 Streptococcus B carrier state complicating childbirth: Secondary | ICD-10-CM

## 2018-05-09 LAB — TYPE AND SCREEN
ABO/RH(D): O POS
ANTIBODY SCREEN: NEGATIVE
EXTEND SAMPLE REASON: UNDETERMINED

## 2018-05-09 LAB — RPR: RPR: NONREACTIVE

## 2018-05-09 SURGERY — Surgical Case
Anesthesia: Spinal

## 2018-05-09 MED ORDER — OXYTOCIN 40 UNITS IN LACTATED RINGERS INFUSION - SIMPLE MED
2.5000 [IU]/h | INTRAVENOUS | Status: DC
  Administered 2018-05-09: 2.5 [IU]/h via INTRAVENOUS

## 2018-05-09 MED ORDER — TETANUS-DIPHTH-ACELL PERTUSSIS 5-2.5-18.5 LF-MCG/0.5 IM SUSP: 0.5000 mL | Freq: Once | INTRAMUSCULAR | Status: DC

## 2018-05-09 MED ORDER — OXYCODONE HCL 5 MG/5ML PO SOLN: 5.0000 mg | Freq: Once | ORAL | Status: DC | PRN

## 2018-05-09 MED ORDER — DEXAMETHASONE SODIUM PHOSPHATE 10 MG/ML IJ SOLN
INTRAMUSCULAR | Status: DC | PRN
  Administered 2018-05-09: 10 mg via INTRAVENOUS

## 2018-05-09 MED ORDER — ONDANSETRON HCL 4 MG/2ML IJ SOLN
INTRAMUSCULAR | Status: AC
  Filled 2018-05-09: qty 2

## 2018-05-09 MED ORDER — KETOROLAC TROMETHAMINE 30 MG/ML IJ SOLN
30.0000 mg | Freq: Four times a day (QID) | INTRAMUSCULAR | Status: AC
  Filled 2018-05-09: qty 1

## 2018-05-09 MED ORDER — SIMETHICONE 80 MG PO CHEW
80.0000 mg | CHEWABLE_TABLET | Freq: Three times a day (TID) | ORAL | Status: DC
  Administered 2018-05-09 – 2018-05-11 (×6): 80 mg via ORAL
  Filled 2018-05-09 (×5): qty 1

## 2018-05-09 MED ORDER — FENTANYL CITRATE (PF) 100 MCG/2ML IJ SOLN
INTRAMUSCULAR | Status: AC
  Filled 2018-05-09: qty 2

## 2018-05-09 MED ORDER — NALOXONE HCL 0.4 MG/ML IJ SOLN: 0.4000 mg | INTRAMUSCULAR | Status: DC | PRN

## 2018-05-09 MED ORDER — KETOROLAC TROMETHAMINE 30 MG/ML IJ SOLN: 30.0000 mg | Freq: Four times a day (QID) | INTRAMUSCULAR | Status: AC

## 2018-05-09 MED ORDER — FENTANYL CITRATE (PF) 100 MCG/2ML IJ SOLN: 25.0000 ug | INTRAMUSCULAR | Status: DC | PRN

## 2018-05-09 MED ORDER — SIMETHICONE 80 MG PO CHEW: 80.0000 mg | CHEWABLE_TABLET | ORAL | Status: DC | PRN

## 2018-05-09 MED ORDER — BUPIVACAINE HCL 0.5 % IJ SOLN
50.0000 mL | Freq: Once | INTRAMUSCULAR | Status: AC
  Filled 2018-05-09: qty 50

## 2018-05-09 MED ORDER — KETOROLAC TROMETHAMINE 30 MG/ML IJ SOLN
INTRAMUSCULAR | Status: AC
  Filled 2018-05-09: qty 1

## 2018-05-09 MED ORDER — DIPHENHYDRAMINE HCL 50 MG/ML IJ SOLN: 12.5000 mg | INTRAMUSCULAR | Status: DC | PRN

## 2018-05-09 MED ORDER — NALBUPHINE HCL 10 MG/ML IJ SOLN: 5.0000 mg | Freq: Once | INTRAMUSCULAR | Status: DC | PRN

## 2018-05-09 MED ORDER — OXYCODONE HCL 5 MG PO TABS: 5.0000 mg | ORAL_TABLET | Freq: Once | ORAL | Status: DC | PRN

## 2018-05-09 MED ORDER — BUPIVACAINE HCL (PF) 0.5 % IJ SOLN
INTRAMUSCULAR | Status: DC | PRN
  Administered 2018-05-09: 10 mL

## 2018-05-09 MED ORDER — DIPHENHYDRAMINE HCL 25 MG PO CAPS
25.0000 mg | ORAL_CAPSULE | ORAL | Status: DC | PRN
  Administered 2018-05-09 – 2018-05-10 (×2): 25 mg via ORAL
  Filled 2018-05-09 (×2): qty 1

## 2018-05-09 MED ORDER — LACTATED RINGERS IV BOLUS
1000.0000 mL | Freq: Once | INTRAVENOUS | Status: AC
  Administered 2018-05-09: 1000 mL via INTRAVENOUS

## 2018-05-09 MED ORDER — WITCH HAZEL-GLYCERIN EX PADS: 1.0000 "application " | MEDICATED_PAD | CUTANEOUS | Status: DC | PRN

## 2018-05-09 MED ORDER — SODIUM CHLORIDE 0.9 % IV SOLN
INTRAVENOUS | Status: DC | PRN
  Administered 2018-05-09: 25 ug/min via INTRAVENOUS

## 2018-05-09 MED ORDER — SODIUM CHLORIDE 0.9% FLUSH: 3.0000 mL | INTRAVENOUS | Status: DC | PRN

## 2018-05-09 MED ORDER — SENNOSIDES-DOCUSATE SODIUM 8.6-50 MG PO TABS
2.0000 | ORAL_TABLET | ORAL | Status: DC
  Administered 2018-05-10: 2 via ORAL
  Filled 2018-05-09 (×2): qty 2

## 2018-05-09 MED ORDER — FENTANYL CITRATE (PF) 100 MCG/2ML IJ SOLN
INTRAMUSCULAR | Status: DC | PRN
  Administered 2018-05-09: 15 ug via INTRATHECAL
  Administered 2018-05-09: 50 ug via INTRAVENOUS
  Administered 2018-05-09: 35 ug via INTRAVENOUS

## 2018-05-09 MED ORDER — LACTATED RINGERS IV SOLN
INTRAVENOUS | Status: DC
  Administered 2018-05-09: 08:00:00 via INTRAVENOUS

## 2018-05-09 MED ORDER — NALBUPHINE HCL 10 MG/ML IJ SOLN: 5.0000 mg | INTRAMUSCULAR | Status: DC | PRN

## 2018-05-09 MED ORDER — DIPHENHYDRAMINE HCL 50 MG/ML IJ SOLN
INTRAMUSCULAR | Status: DC | PRN
  Administered 2018-05-09 (×2): 12.5 mg via INTRAVENOUS

## 2018-05-09 MED ORDER — ACETAMINOPHEN 325 MG PO TABS
650.0000 mg | ORAL_TABLET | ORAL | Status: DC | PRN
  Administered 2018-05-10: 650 mg via ORAL
  Filled 2018-05-09: qty 2

## 2018-05-09 MED ORDER — KETOROLAC TROMETHAMINE 30 MG/ML IJ SOLN
INTRAMUSCULAR | Status: DC | PRN
  Administered 2018-05-09: 30 mg via INTRAVENOUS

## 2018-05-09 MED ORDER — ONDANSETRON HCL 4 MG/2ML IJ SOLN: 4.0000 mg | Freq: Three times a day (TID) | INTRAMUSCULAR | Status: DC | PRN

## 2018-05-09 MED ORDER — SIMETHICONE 80 MG PO CHEW
80.0000 mg | CHEWABLE_TABLET | ORAL | Status: DC
  Filled 2018-05-09: qty 1

## 2018-05-09 MED ORDER — BUPIVACAINE 0.25 % ON-Q PUMP DUAL CATH 400 ML
400.0000 mL | INJECTION | Status: DC
  Filled 2018-05-09: qty 400

## 2018-05-09 MED ORDER — ACETAMINOPHEN 10 MG/ML IV SOLN
INTRAVENOUS | Status: AC
  Filled 2018-05-09: qty 100

## 2018-05-09 MED ORDER — LIDOCAINE HCL (PF) 1 % IJ SOLN
INTRAMUSCULAR | Status: DC | PRN
  Administered 2018-05-09: 3 mL via SUBCUTANEOUS

## 2018-05-09 MED ORDER — SOD CITRATE-CITRIC ACID 500-334 MG/5ML PO SOLN
30.0000 mL | ORAL | Status: AC
  Administered 2018-05-09: 30 mL via ORAL
  Filled 2018-05-09: qty 15

## 2018-05-09 MED ORDER — OXYTOCIN 40 UNITS IN LACTATED RINGERS INFUSION - SIMPLE MED
INTRAVENOUS | Status: DC | PRN
  Administered 2018-05-09: 1000 mL via INTRAVENOUS

## 2018-05-09 MED ORDER — DIPHENHYDRAMINE HCL 50 MG/ML IJ SOLN
INTRAMUSCULAR | Status: AC
  Filled 2018-05-09: qty 1

## 2018-05-09 MED ORDER — MORPHINE SULFATE (PF) 0.5 MG/ML IJ SOLN
INTRAMUSCULAR | Status: AC
  Filled 2018-05-09: qty 10

## 2018-05-09 MED ORDER — OXYTOCIN 40 UNITS IN LACTATED RINGERS INFUSION - SIMPLE MED
INTRAVENOUS | Status: AC
  Administered 2018-05-09: 2.5 [IU]/h via INTRAVENOUS
  Filled 2018-05-09: qty 1000

## 2018-05-09 MED ORDER — ONDANSETRON HCL 4 MG/2ML IJ SOLN
INTRAMUSCULAR | Status: DC | PRN
  Administered 2018-05-09: 4 mg via INTRAVENOUS

## 2018-05-09 MED ORDER — ACETAMINOPHEN 10 MG/ML IV SOLN
INTRAVENOUS | Status: DC | PRN
  Administered 2018-05-09: 1000 mg via INTRAVENOUS

## 2018-05-09 MED ORDER — OXYCODONE HCL 5 MG PO TABS: 5.0000 mg | ORAL_TABLET | ORAL | Status: AC | PRN

## 2018-05-09 MED ORDER — GENTAMICIN SULFATE 40 MG/ML IJ SOLN
5.0000 mg/kg | INTRAVENOUS | Status: AC
  Administered 2018-05-09: 350 mg via INTRAVENOUS
  Filled 2018-05-09: qty 8.75

## 2018-05-09 MED ORDER — DEXAMETHASONE SODIUM PHOSPHATE 10 MG/ML IJ SOLN
INTRAMUSCULAR | Status: AC
  Filled 2018-05-09: qty 1

## 2018-05-09 MED ORDER — BUPIVACAINE IN DEXTROSE 0.75-8.25 % IT SOLN
INTRATHECAL | Status: DC | PRN
  Administered 2018-05-09: 1.6 mL via INTRATHECAL

## 2018-05-09 MED ORDER — OXYTOCIN 40 UNITS IN LACTATED RINGERS INFUSION - SIMPLE MED
INTRAVENOUS | Status: AC
  Filled 2018-05-09: qty 1000

## 2018-05-09 MED ORDER — LACTATED RINGERS IV SOLN
INTRAVENOUS | Status: DC
  Administered 2018-05-10: 03:00:00 via INTRAVENOUS

## 2018-05-09 MED ORDER — MEPERIDINE HCL 25 MG/ML IJ SOLN: 6.2500 mg | INTRAMUSCULAR | Status: DC | PRN

## 2018-05-09 MED ORDER — PHENYLEPHRINE HCL 10 MG/ML IJ SOLN
INTRAMUSCULAR | Status: AC
  Filled 2018-05-09: qty 1

## 2018-05-09 MED ORDER — COCONUT OIL OIL
1.0000 "application " | TOPICAL_OIL | Status: DC | PRN
  Filled 2018-05-09: qty 120

## 2018-05-09 MED ORDER — CLINDAMYCIN PHOSPHATE 900 MG/50ML IV SOLN
900.0000 mg | INTRAVENOUS | Status: AC
  Administered 2018-05-09: 900 mg via INTRAVENOUS
  Filled 2018-05-09: qty 50

## 2018-05-09 MED ORDER — PRENATAL MULTIVITAMIN CH
1.0000 | ORAL_TABLET | Freq: Every day | ORAL | Status: DC
  Administered 2018-05-10 – 2018-05-11 (×2): 1 via ORAL
  Filled 2018-05-09 (×2): qty 1

## 2018-05-09 MED ORDER — DIBUCAINE 1 % RE OINT: 1.0000 "application " | TOPICAL_OINTMENT | RECTAL | Status: DC | PRN

## 2018-05-09 MED ORDER — MENTHOL 3 MG MT LOZG
1.0000 | LOZENGE | OROMUCOSAL | Status: DC | PRN
  Filled 2018-05-09: qty 9

## 2018-05-09 MED ORDER — ACETAMINOPHEN 325 MG PO TABS
650.0000 mg | ORAL_TABLET | Freq: Four times a day (QID) | ORAL | Status: AC
  Administered 2018-05-10: 650 mg via ORAL
  Filled 2018-05-09 (×3): qty 2

## 2018-05-09 MED ORDER — IBUPROFEN 600 MG PO TABS
600.0000 mg | ORAL_TABLET | Freq: Four times a day (QID) | ORAL | Status: DC
  Administered 2018-05-11: 600 mg via ORAL
  Filled 2018-05-09 (×2): qty 1

## 2018-05-09 MED ORDER — MORPHINE SULFATE (PF) 0.5 MG/ML IJ SOLN
INTRAMUSCULAR | Status: DC | PRN
  Administered 2018-05-09: .1 mg via EPIDURAL

## 2018-05-09 SURGICAL SUPPLY — 29 items
BAG COUNTER SPONGE EZ (MISCELLANEOUS) ×2 IMPLANT
CANISTER SUCT 3000ML PPV (MISCELLANEOUS) ×3 IMPLANT
CATH KIT ON-Q SILVERSOAK 5IN (CATHETERS) ×6 IMPLANT
CHLORAPREP W/TINT 26ML (MISCELLANEOUS) ×6 IMPLANT
CLOSURE WOUND 1/2 X4 (GAUZE/BANDAGES/DRESSINGS) ×1
COUNTER SPONGE BAG EZ (MISCELLANEOUS) ×1
DERMABOND ADVANCED (GAUZE/BANDAGES/DRESSINGS) ×2
DERMABOND ADVANCED .7 DNX12 (GAUZE/BANDAGES/DRESSINGS) ×1 IMPLANT
DRSG OPSITE POSTOP 4X10 (GAUZE/BANDAGES/DRESSINGS) ×3 IMPLANT
DRSG TELFA 3X8 NADH (GAUZE/BANDAGES/DRESSINGS) ×3 IMPLANT
ELECT CAUTERY BLADE 6.4 (BLADE) ×3 IMPLANT
ELECT REM PT RETURN 9FT ADLT (ELECTROSURGICAL) ×3
ELECTRODE REM PT RTRN 9FT ADLT (ELECTROSURGICAL) ×1 IMPLANT
GAUZE SPONGE 4X4 12PLY STRL (GAUZE/BANDAGES/DRESSINGS) ×3 IMPLANT
GLOVE BIO SURGEON STRL SZ7 (GLOVE) ×9 IMPLANT
GLOVE INDICATOR 7.5 STRL GRN (GLOVE) ×9 IMPLANT
GOWN STRL REUS W/ TWL LRG LVL3 (GOWN DISPOSABLE) ×3 IMPLANT
GOWN STRL REUS W/TWL LRG LVL3 (GOWN DISPOSABLE) ×6
NS IRRIG 1000ML POUR BTL (IV SOLUTION) ×3 IMPLANT
PACK C SECTION AR (MISCELLANEOUS) ×3 IMPLANT
PAD OB MATERNITY 4.3X12.25 (PERSONAL CARE ITEMS) ×3 IMPLANT
PAD PREP 24X41 OB/GYN DISP (PERSONAL CARE ITEMS) ×3 IMPLANT
STAPLER INSORB 30 2030 C-SECTI (MISCELLANEOUS) ×3 IMPLANT
STRIP CLOSURE SKIN 1/2X4 (GAUZE/BANDAGES/DRESSINGS) ×2 IMPLANT
SUT MNCRL AB 4-0 PS2 18 (SUTURE) ×3 IMPLANT
SUT PDS AB 1 TP1 96 (SUTURE) ×3 IMPLANT
SUT VIC AB 0 CTX 36 (SUTURE) ×2
SUT VIC AB 0 CTX36XBRD ANBCTRL (SUTURE) ×1 IMPLANT
SUT VIC AB 2-0 CT1 36 (SUTURE) IMPLANT

## 2018-05-09 NOTE — Op Note (Signed)
Preoperative Diagnosis: 1) 32 y.o. [id number] at 7036w3d 2) Prior cesarean section 3) Chronic hypertension 4) Polyhydramnios  Postoperative Diagnosis: 1) 32 y.o. [id number] at 8736w3d 2) Prior cesarean section 3) Chronic hypertension 4) Polyhydramnios  Operation Performed: Repeat low transverse C-section via pfannenstiel skin incision  Indiciation:  Anesthesia: .Choice  Primary Surgeon: Vena AustriaAndreas Shantaya Bluestone, MD   Assistant:Christianna Jerene PitchSchuman, MD  Preoperative Antibiotics: Clindamycin and gentamycin  Estimated Blood Loss: 593 mL  IV Fluids: 2L crystloid  Urine Output:: 50mL  Drains or Tubes: Foley to gravity drainage, ON-Q catheter system  Implants: none  Specimens Removed: none  Complications: none  Intraoperative Findings:  Normal tubes ovaries and uterus.  Delivery resulted in the birth of a liveborn female, APGAR (1 MIN): 8   APGAR (5 MINS): 8  APGAR (10 MINS):  , weight 8lbs Patient Condition:stable  Procedure in Detail:  Patient was taken to the operating room were she was administered regional anesthesia.  She was positioned in the supine position, prepped and draped in the  Usual sterile fashion.  Prior to proceeding with the case a time out was performed and the level of anesthetic was checked and noted to be adequate.  Utilizing the scalpel a pfannenstiel skin incision was made 2cm above the pubic symphysis utilizing the patient's pre-existing scar and carried down sharply to the the level of the rectus fascia.  The fascia was incised in the midline using the scalpel and then extended using mayo scissors.  The superior border of the rectus fascia was grasped with two Kocher clamps and the underlying rectus muscles were dissected of the fascia using blunt dissection.  The median raphae was incised using Mayo scissors.   The inferior border of the rectus fascia was dissected of the rectus muscles in a similar fashion.  The midline was identified, the peritoneum was entered bluntly  and expanded using manual tractions.  The uterus was noted to be in a none rotated position.  Next the bladder blade was placed retracting the bladder caudally.  A bladder flap was not created.  A low transverse incision was scored on the lower uterine segment.  The hysterotomy was entered bluntly using the operators finger.  The hysterotomy incision was extended using manual traction.  The operators hand was placed within the hysterotomy position noting the fetus to be within the OA position.  The vertex was grasped, flexed, brought to the incision, and delivered a traumatically using fundal pressure.  The remainder of the body delivered with ease.  The infant was suctioned, cord was clamped and cut before handing off to the awaiting neonatologist.  The placenta was delivered using manual extraction.  The uterus was exteriorized, wiped clean of clots and debris using two moist laps.  The hysterotomy was closed using a two layer closure of 0 Vicryl, with the first being a running locked, the second a vertical imbricating.  The uterus was returned to the abdomen.  The peritoneal gutters were wiped clean of clots and debris using two moist laps.  The hysterotomy incision was re-inspected noted to be hemostatic.  The rectus muscles were re-approximated in the midline using a single 2-0 Vicryl mattress stitch.  The rectus muscles were inspected noted to be hemostatic.  The superior border of the rectus fascia was grasped with a Kocher clamp.  The ON-Q trocars were then placed 4cm above the superior border of the incision and tunneled subfascially.  The introducers were removed and the catheters were threaded through the sleeves after which the  sleeves were removed.  The fascia was closed using a looped #1 PDS in a running fashion taking 1cm by 1cm bites.  The subcutaneous tissue was irrigated using warm saline, hemostasis achieved using the bovie.  The subcutaneous dead space was less than 3cm and was not closed.  The  skin was closed using 4-0 Monocryl in a subcuticular fashion.  Sponge needle and instrument counts were corrects times two.  The patient tolerated the procedure well and was taken to the recovery room in stable condition.

## 2018-05-09 NOTE — Anesthesia Post-op Follow-up Note (Signed)
Anesthesia QCDR form completed.        

## 2018-05-09 NOTE — H&P (Signed)
Date of Initial H&P: 05/08/2018  History reviewed, patient examined, no change in status, stable for surgery.

## 2018-05-09 NOTE — Transfer of Care (Signed)
Immediate Anesthesia Transfer of Care Note  Patient: [name]  Procedure(s) Performed: CESAREAN SECTION (N/A )  Patient Location: PACU  Anesthesia Type:Spinal  Level of Consciousness: awake, alert , oriented and patient cooperative  Airway & Oxygen Therapy: Patient Spontanous Breathing  Post-op Assessment: Report given to RN, Post -op Vital signs reviewed and stable and Patient moving all extremities  Post vital signs: Reviewed and stable  Last Vitals:  Vitals Value Taken Time  BP 124/76 05/09/2018 11:40 AM  Temp 36.4 C 05/09/2018 11:40 AM  Pulse 88 05/09/2018 11:40 AM  Resp 19 05/09/2018 11:40 AM  SpO2 97 % 05/09/2018 11:40 AM    Last Pain:  Vitals:   05/09/18 1140  TempSrc: Oral  PainSc: 0-No pain         Complications: No apparent anesthesia complications

## 2018-05-09 NOTE — Discharge Summary (Signed)
Obstetric Discharge Summary Reason for Admission: cesarean section, polyhydramnios, and CHTN Prenatal Procedures: none Intrapartum Procedures: cesarean: low cervical, transverse Postpartum Procedures: none Complications-Operative and Postpartum: none Hemoglobin  Date Value Ref Range Status  05/10/2018 11.1 (L) 12.0 - 16.0 g/dL Final  40/98/119108/01/2018 47.812.0 11.1 - 15.9 g/dL Final   HCT  Date Value Ref Range Status  05/10/2018 32.2 (L) 35.0 - 47.0 % Final   Hematocrit  Date Value Ref Range Status  04/22/2018 36.5 34.0 - 46.6 % Final    Physical Exam:  General: alert, cooperative and no distress Lochia: appropriate Uterine Fundus: firm Incision: healing well DVT Evaluation: No evidence of DVT seen on physical exam.  Discharge Diagnoses: Term Pregnancy-delivered  Discharge Information: Date: 05/11/2018 Activity: unrestricted Diet: routine Allergies as of 05/11/2018      Reactions   Amoxicillin Itching   Has patient had a PCN reaction causing immediate rash, facial/tongue/throat swelling, SOB or lightheadedness with hypotension: No Has patient had a PCN reaction causing severe rash involving mucus membranes or skin necrosis: No Has patient had a PCN reaction that required hospitalization: No Has patient had a PCN reaction occurring within the last 10 years: No If all of the above answers are "NO", then may proceed with Cephalosporin use.   Keflex [cephalexin] Itching      Medication List    STOP taking these medications   ferrous sulfate 325 (65 FE) MG tablet   promethazine 25 MG tablet Commonly known as:  PHENERGAN     TAKE these medications   acetaminophen 500 MG tablet Commonly known as:  TYLENOL Take 1,000 mg by mouth every 6 (six) hours as needed (for pain).   cetirizine 10 MG tablet Commonly known as:  ZYRTEC Take 10 mg by mouth every evening.   diphenhydrAMINE 25 MG tablet Commonly known as:  BENADRYL Take 25 mg by mouth every 6 (six) hours as needed.    esomeprazole 20 MG capsule Commonly known as:  NEXIUM Take 20 mg by mouth every evening.   oxyCODONE-acetaminophen 5-325 MG tablet Commonly known as:  PERCOCET/ROXICET Take 1 tablet by mouth every 4 (four) hours as needed for moderate pain or severe pain.   prenatal multivitamin Tabs tablet Take 1 tablet by mouth every evening.   sertraline 100 MG tablet Commonly known as:  ZOLOFT Take 100 mg by mouth every evening.       Condition: stable Discharge to: home Follow-up Information    Vena AustriaStaebler, Andreas, MD. Go in 1 week.   Specialty:  Obstetrics and Gynecology Why:  For wound re-check Contact information: [address] [place] [id number] [phone]           Newborn Data: Live born female  Birth Weight: 7 lb 15.7 oz (3620 g) APGAR: 8, 8  Newborn Delivery   Birth date/time:  05/09/2018 10:51:00 Delivery type:  C-Section, Low Transverse Trial of labor:  No C-section categorization:  Repeat     Home with mother.  [name] 05/11/2018, 9:10 AM

## 2018-05-09 NOTE — Anesthesia Preprocedure Evaluation (Signed)
Anesthesia Evaluation  Patient identified by MRN, date of birth, ID band Patient awake    Reviewed: Allergy & Precautions, H&P , NPO status , Patient's Chart, lab work & pertinent test results  History of Anesthesia Complications Negative for: history of anesthetic complications  Airway Mallampati: III  TM Distance: <3 FB Neck ROM: full    Dental  (+) Chipped   Pulmonary neg pulmonary ROS, neg shortness of breath,           Cardiovascular Exercise Tolerance: Good hypertension,      Neuro/Psych PSYCHIATRIC DISORDERS Anxiety  Neuromuscular disease    GI/Hepatic negative GI ROS, hiatal hernia, GERD  Medicated and Controlled,  Endo/Other    Renal/GU   negative genitourinary   Musculoskeletal   Abdominal   Peds  Hematology negative hematology ROS (+)   Anesthesia Other Findings Patient reports scoliosis   Past Medical History: No date: Anxiety No date: History of hiatal hernia No date: Hypertension     Comment:  with pregnancy. 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 No date: Palpitations No date: Scoliosis No date: Trouble in sleeping  Past Surgical History: 03/04/2013: CESAREAN SECTION; N/A     Comment:  Procedure: CESAREAN SECTION;  Surgeon: Bing Plumehomas F Henley,               MD;  Location: [facility];  Service: Obstetrics;  Laterality:              N/A;  BMI    Body Mass Index:  37.91 kg/m      Reproductive/Obstetrics (+) Pregnancy                             Anesthesia Physical Anesthesia Plan  ASA: III  Anesthesia Plan: Spinal   Post-op Pain Management:    Induction:   PONV Risk Score and Plan:   Airway Management Planned: Natural Airway and Nasal Cannula  Additional Equipment:   Intra-op Plan:   Post-operative Plan:   Informed Consent: I have reviewed the patients History and Physical, chart, labs and discussed the procedure  including the risks, benefits and alternatives for the proposed anesthesia with the patient or authorized representative who has indicated his/her understanding and acceptance.   Dental Advisory Given  Plan Discussed with: Anesthesiologist, CRNA and Surgeon  Anesthesia Plan Comments: (Patient reports no bleeding problems and no anticoagulant use.  Plan for spinal with backup GA  Patient consented for risks of anesthesia including but not limited to:  - adverse reactions to medications - risk of bleeding, infection, nerve damage and headache - risk of failed spinal - damage to teeth, lips or other oral mucosa - sore throat or hoarseness - Damage to heart, brain, lungs or loss of life  Patient voiced understanding.)        Anesthesia Quick Evaluation

## 2018-05-09 NOTE — Lactation Note (Signed)
This note was copied from a baby's chart. Lactation Consultation Note  Patient Name: Boy Talbert ForestMegan Stayer WGNFA'OToday's Date: 05/09/2018 Reason for consult: Initial assessment   Maternal Data Has patient been taught Hand Expression?: Yes  Feeding Feeding Type: Breast Fed Length of feed: 15 min  LATCH Score Latch: Grasps breast easily, tongue down, lips flanged, rhythmical sucking.  Audible Swallowing: A few with stimulation  Type of Nipple: Everted at rest and after stimulation  Comfort (Breast/Nipple): Soft / non-tender  Hold (Positioning): Assistance needed to correctly position infant at breast and maintain latch.  LATCH Score: 8  Interventions Interventions: Breast feeding basics reviewed;Assisted with latch;Skin to skin;Hand express;Adjust position;Support pillows  Lactation Tools Discussed/Used     Consult Status Consult Status: PRN LC to room to assist with latch and positioning. Mother states that she tried to breastfeed older child but was unsuccessful. LC assisted with sandwiching the breast tissue and infant was able to latch on. Mother states that infant latched for 15 minutes. Breastfeeding basics were reviewed with mother.    Arlyss Gandylicia Journie Howson 05/09/2018, 2:29 PM

## 2018-05-10 ENCOUNTER — Encounter: Payer: Self-pay | Admitting: Obstetrics and Gynecology

## 2018-05-10 LAB — CBC
HEMATOCRIT: 32.2 % — AB (ref 35.0–47.0)
HEMOGLOBIN: 11.1 g/dL — AB (ref 12.0–16.0)
MCH: 29.8 pg (ref 26.0–34.0)
MCHC: 34.4 g/dL (ref 32.0–36.0)
MCV: 86.5 fL (ref 80.0–100.0)
Platelets: 138 10*3/uL — ABNORMAL LOW (ref 150–440)
RBC: 3.72 MIL/uL — AB (ref 3.80–5.20)
RDW: 21.4 % — ABNORMAL HIGH (ref 11.5–14.5)
WBC: 20 10*3/uL — ABNORMAL HIGH (ref 3.6–11.0)

## 2018-05-10 MED ORDER — LORATADINE 10 MG PO TABS
10.0000 mg | ORAL_TABLET | Freq: Every day | ORAL | Status: DC
  Administered 2018-05-10 – 2018-05-11 (×2): 10 mg via ORAL
  Filled 2018-05-10 (×2): qty 1

## 2018-05-10 MED ORDER — PANTOPRAZOLE SODIUM 40 MG PO TBEC
40.0000 mg | DELAYED_RELEASE_TABLET | Freq: Every day | ORAL | Status: DC
  Administered 2018-05-10 – 2018-05-11 (×2): 40 mg via ORAL
  Filled 2018-05-10 (×2): qty 1

## 2018-05-10 MED ORDER — SERTRALINE HCL 100 MG PO TABS
100.0000 mg | ORAL_TABLET | Freq: Every day | ORAL | Status: DC
  Administered 2018-05-10 – 2018-05-11 (×2): 100 mg via ORAL
  Filled 2018-05-10 (×2): qty 1

## 2018-05-10 NOTE — Progress Notes (Signed)
Pt requesting to take home medications: Zoloft, Nexium, and Zyrtec. Dr. Jean RosenthalJackson paged, waiting for response.

## 2018-05-10 NOTE — Progress Notes (Signed)
POD #1 Repeat CS, CHTN, polyhydraminos Subjective:  Feeling well. Good pain control. "ON Q really makes a difference". Tolerating regular diet and passing flatus Breast feeding. Did give some formula this AM (baby's blood sugar a little low). OOB ambulating without lightheadedness. Voiding without difficulty.   Objective:  Blood pressure 130/80, pulse 77, temperature 98 F (36.7 C), temperature source Oral, resp. rate 20, height 5\' 3"  (1.6 m), weight 97.1 kg, last menstrual period 08/13/2017, SpO2 97 %, unknown if currently breastfeeding.  General: WF in NAD Pulmonary: no increased work of breathing/ CTAB Heart: RRR without murmur Abdomen: non-distended, non-tender, fundus firm at level of umbilicus-2FB Incision:Honey comb dressing intact, there is serosanguinous drainage covering about 1/4 of the dressing on the right side, ON Q intact Extremities: no edema, no erythema, no tenderness  Results for orders placed or performed during the hospital encounter of 05/09/18 (from the past 72 hour(s))  CBC     Status: Abnormal   Collection Time: 05/10/18  5:17 AM  Result Value Ref Range   WBC 20.0 (H) 3.6 - 11.0 K/uL   RBC 3.72 (L) 3.80 - 5.20 MIL/uL   Hemoglobin 11.1 (L) 12.0 - 16.0 g/dL   HCT 91.432.2 (L) 78.235.0 - 95.647.0 %   MCV 86.5 80.0 - 100.0 fL   MCH 29.8 26.0 - 34.0 pg   MCHC 34.4 32.0 - 36.0 g/dL   RDW 21.321.4 (H) 08.611.5 - 57.814.5 %   Platelets 138 (L) 150 - 440 K/uL    Comment: Performed at [facility], [address] [place], [place] [address]     Assessment:   32 y.o. [id number] postoperativeday # 1-stable   Plan:  1) Mild blood loss anemia - hemodynamically stable and asymptomatic - po ferrous sulfate/ vitamins  2) O POS/ RI/ VI  3) TDAP UTD  4) Breast/Bottle/Contraception-undecided  5) Disposition-possible discharge tomorrow  Farrel Connersolleen Melinda Gwinner, [name]

## 2018-05-10 NOTE — Anesthesia Postprocedure Evaluation (Signed)
Anesthesia Post Note  Patient: Jenna ForestMegan [name]  Procedure(s) Performed: CESAREAN SECTION (N/A )  Patient location during evaluation: Mother Baby Anesthesia Type: Spinal Level of consciousness: oriented and awake and alert Pain management: pain level controlled Vital Signs Assessment: post-procedure vital signs reviewed and stable Respiratory status: spontaneous breathing Cardiovascular status: blood pressure returned to baseline and stable Postop Assessment: no headache, no backache, no apparent nausea or vomiting, patient able to bend at knees and able to ambulate Anesthetic complications: no     Last Vitals:  Vitals:   05/10/18 0032 05/10/18 0428  BP: 124/80 114/68  Pulse: 76 83  Resp:    Temp: 36.8 C 36.8 C  SpO2: 93% 95%    Last Pain:  Vitals:   05/10/18 0428  TempSrc: Oral  PainSc:                  Cleda MccreedyJoseph K Piscitello

## 2018-05-10 NOTE — Lactation Note (Signed)
This note was copied from a baby's chart. Lactation Consultation Note  Patient Name: [name] BJYNW'GToday's Date: 05/10/2018 Reason for consult: Follow-up assessment   Maternal Data Formula Feeding for Exclusion: No Does the patient have breastfeeding experience prior to this delivery?: Yes Supplement due to low milk supply Feeding Feeding Type: Breast Fed Length of feed: 20 min  LATCH Score Latch: Grasps breast easily, tongue down, lips flanged, rhythmical sucking.  Audible Swallowing: A few with stimulation  Type of Nipple: Everted at rest and after stimulation  Comfort (Breast/Nipple): Soft / non-tender  Hold (Positioning): Assistance needed to correctly position infant at breast and maintain latch.  LATCH Score: 8  Interventions Interventions: Breast feeding basics reviewed;Assisted with latch;Breast compression;Adjust position;Support pillows;Position options  Lactation Tools Discussed/Used WIC Program: No   Consult Status Consult Status: PRN Mom encouraged to offer breast every 2-3 hrs to baby, shown how to sandwich and shape breast to get a deeper latch   [name] 05/10/2018, 1:19 PM

## 2018-05-10 NOTE — Anesthesia Post-op Follow-up Note (Signed)
  Anesthesia Pain Follow-up Note  Patient: [name]  Day #: 1  Date of Follow-up: 05/10/2018 Time: 7:43 AM  Last Vitals:  Vitals:   05/10/18 0032 05/10/18 0428  BP: 124/80 114/68  Pulse: 76 83  Resp:    Temp: 36.8 C 36.8 C  SpO2: 93% 95%    Level of Consciousness: alert  Pain: mild   Side Effects:None  Catheter Site Exam:clean, dry     Plan: D/C from anesthesia care at surgeon's request  Cleda MccreedyJoseph K Carolos Fecher

## 2018-05-11 MED ORDER — OXYCODONE-ACETAMINOPHEN 5-325 MG PO TABS: 1.0000 | ORAL_TABLET | ORAL | 0 refills | Status: DC | PRN

## 2018-05-11 NOTE — Discharge Instructions (Signed)
Cesarean Delivery, Care After Refer to this sheet in the next few weeks. These instructions provide you with information about caring for yourself after your procedure. Your health care provider may also give you more specific instructions. Your treatment has been planned according to current medical practices, but problems sometimes occur. Call your health care provider if you have any problems or questions after your procedure. What can I expect after the procedure? After the procedure, it is common to have:  A small amount of blood or clear fluid coming from the incision.  Some redness, swelling, and pain in your incision area.  Some abdominal pain and soreness.  Vaginal bleeding (lochia).  Pelvic cramps.  Fatigue.  Follow these instructions at home: Incision care   Follow instructions from your health care provider about how to take care of your incision. Make sure you: ? Wash your hands with soap and water before you change your bandage (dressing). If soap and water are not available, use hand sanitizer. ? If you have a dressing, change it as told by your health care provider. ? Leave stitches (sutures), skin staples, skin glue, or adhesive strips in place. These skin closures may need to stay in place for 2 weeks or longer. If adhesive strip edges start to loosen and curl up, you may trim the loose edges. Do not remove adhesive strips completely unless your health care provider tells you to do that.  Check your incision area every day for signs of infection. Check for: ? More redness, swelling, or pain. ? More fluid or blood. ? Warmth. ? Pus or a bad smell.  When you cough or sneeze, hug a pillow. This helps with pain and decreases the chance of your incision opening up (dehiscing). Do this until your incision heals. Medicines  Take over-the-counter and prescription medicines only as told by your health care provider.  If you were prescribed an antibiotic medicine, take it  as told by your health care provider. Do not stop taking the antibiotic until it is finished. Driving  Do not drive or operate heavy machinery while taking prescription pain medicine. Lifestyle  Do not drink alcohol. This is especially important if you are breastfeeding or taking pain medicine.  Do not use tobacco products, including cigarettes, chewing tobacco, or e-cigarettes. If you need help quitting, ask your health care provider. Tobacco can delay wound healing. Eating and drinking  Drink at least 8 eight-ounce glasses of water every day unless told not to by your health care provider. If you breastfeed, you may need to drink more water than this.  Eat high-fiber foods every day. These foods may help prevent or relieve constipation. High-fiber foods include: ? Whole grain cereals and breads. ? Brown rice. ? Beans. ? Fresh fruits and vegetables. Activity  Return to your normal activities as told by your health care provider. Ask your health care provider what activities are safe for you.  Rest as much as possible. Try to rest or take a nap while your baby is sleeping.  Do not lift anything that is heavier than your baby or 10 lb (4.5 kg) as told by your health care provider.  Ask your health care provider when you can engage in sexual activity. This may depend on your: ? Risk of infection. ? Healing rate. ? Comfort and desire to engage in sexual activity. Bathing  Do not take baths, swim, or use a hot tub until your health care provider approves. Ask your health care provider if  you can take showers. You may only be allowed to take sponge baths until your incision heals. General instructions  Do not use tampons or douches until your health care provider approves.  Wear: ? Loose, comfortable clothing. ? A supportive and well-fitting bra.  Watch for any blood clots that may pass from your vagina. These may look like clumps of dark red, brown, or black discharge.  Keep  your perineum clean and dry as told by your health care provider.  Wipe from front to back when you use the toilet.  If possible, have someone help you care for your baby and help with household activities for a few days after you leave the hospital.  Keep all follow-up visits for you and your baby as told by your health care provider. This is important. Contact a health care provider if:  You have: ? Bad-smelling vaginal discharge. ? Difficulty urinating. ? Pain when urinating. ? A sudden increase or decrease in the frequency of your bowel movements. ? More redness, swelling, or pain around your incision. ? More fluid or blood coming from your incision. ? Pus or a bad smell coming from your incision. ? A fever. ? A rash. ? Little or no interest in activities you used to enjoy. ? Questions about caring for yourself or your baby. ? Nausea.  Your incision feels warm to the touch.  Your breasts turn red or become painful or hard.  You feel unusually sad or worried.  You vomit.  You pass large blood clots from your vagina. If you pass a blood clot, save it to show to your health care provider. Do not flush blood clots down the toilet without showing your health care provider.  You urinate more than usual.  You are dizzy or light-headed.  You have not breastfed and have not had a menstrual period for 12 weeks after delivery.  You stopped breastfeeding and have not had a menstrual period for 12 weeks after stopping breastfeeding. Get help right away if:  You have: ? Pain that does not go away or get better with medicine. ? Chest pain. ? Difficulty breathing. ? Blurred vision or spots in your vision. ? Thoughts about hurting yourself or your baby. ? New pain in your abdomen or in one of your legs. ? A severe headache.  You faint.  You bleed from your vagina so much that you fill two sanitary pads in one hour. This information is not intended to replace advice given to  you by your health care provider. Make sure you discuss any questions you have with your health care provider. Document Released: 05/27/2002 Document Revised: 10/07/2016 Document Reviewed: 08/09/2015 Elsevier Interactive Patient Education  2018 [employer].  Call your doctor for increased pain or vaginal bleeding, temperature above 100.4, depression, or concerns.  Keep incision clean and dry.  Call your doctor for incision redness, swelling, bleeding or drainage, or if begins to come apart.  Please follow instructions included in incision hygiene kit.  No strenuous activity or heavy lifting for 6 weeks.  No intercourse, tampons, or douching for 6 weeks.  No tub baths- showers only.  No driving for 2 weeks or while taking pain medications.  Increase calories and fluids while breastfeeding.  Continue prenatal vitamin and iron.

## 2018-05-11 NOTE — Progress Notes (Signed)
Discharge instructions provided.  Pt and sig other verbalize understanding of all instructions and follow-up care.  Incision Hygiene Kit given.  Instructions provided on care and removal of On-Q Pump. Pt discharged to home with infant at 1350 on 05/11/18 via wheelchair by RN. Reed Breech, RN 05/11/2018

## 2018-05-11 NOTE — Progress Notes (Signed)
Admit Date: 05/09/2018 Today's Date: 05/11/2018  Subjective: Postpartum Day 2: Cesarean Delivery Patient reports tolerating PO, + BM and no problems voiding.    Objective: Vital signs in last 24 hours: Temp:  [97.5 F (36.4 C)-98.6 F (37 C)] 98.1 F (36.7 C) (08/24 0746) Pulse Rate:  [90-99] 98 (08/24 0746) Resp:  [18-20] 18 (08/24 0746) BP: (129-139)/(78-88) 138/88 (08/24 0746) SpO2:  [97 %-99 %] 99 % (08/24 0746)  Physical Exam:  General: alert, cooperative and no distress Lochia: appropriate Uterine Fundus: firm Incision: healing well DVT Evaluation: No evidence of DVT seen on physical exam.  Recent Labs    05/08/18 1154 05/10/18 0517  HGB 12.7 11.1*  HCT 37.4 32.2*    Assessment/Plan: Status post Cesarean section. Doing well postoperatively.  Continue current care. [facility]  Letitia LibraRobert Paul Harris 05/11/2018, 9:11 AM

## 2018-05-17 ENCOUNTER — Encounter: Payer: Self-pay | Admitting: Obstetrics and Gynecology

## 2018-05-17 ENCOUNTER — Ambulatory Visit (INDEPENDENT_AMBULATORY_CARE_PROVIDER_SITE_OTHER): Payer: Managed Care, Other (non HMO) | Admitting: Obstetrics and Gynecology

## 2018-05-17 VITALS — BP 146/100 | HR 115 | Ht 63.0 in | Wt 192.0 lb

## 2018-05-17 DIAGNOSIS — O99345 Other mental disorders complicating the puerperium: Secondary | ICD-10-CM

## 2018-05-17 DIAGNOSIS — Z0130 Encounter for examination of blood pressure without abnormal findings: Secondary | ICD-10-CM

## 2018-05-17 DIAGNOSIS — F53 Postpartum depression: Secondary | ICD-10-CM

## 2018-05-17 DIAGNOSIS — Z4889 Encounter for other specified surgical aftercare: Secondary | ICD-10-CM

## 2018-05-17 MED ORDER — BUSPIRONE HCL 7.5 MG PO TABS: 7.5000 mg | ORAL_TABLET | Freq: Two times a day (BID) | ORAL | 2 refills | Status: DC

## 2018-05-17 MED ORDER — SERTRALINE HCL 50 MG PO TABS: 50.0000 mg | ORAL_TABLET | Freq: Every day | ORAL | 2 refills | Status: DC

## 2018-05-17 MED ORDER — HYDROXYZINE HCL 25 MG PO TABS: 25.0000 mg | ORAL_TABLET | Freq: Four times a day (QID) | ORAL | 2 refills | Status: DC | PRN

## 2018-05-17 MED ORDER — NORETHINDRONE 0.35 MG PO TABS: 1.0000 | ORAL_TABLET | Freq: Every day | ORAL | 11 refills | Status: DC

## 2018-05-17 NOTE — Progress Notes (Signed)
      Postoperative Follow-up Patient presents post op from repeat cesarean section 1weeks ago for history of prior cesarean section, chronic HTN, and polyhydramnios.  Subjective: Patient reports some improvement in her preop symptoms. Eating a regular diet without difficulty. Pain well controlled  Activity: normal activities of daily living.  The patient is a 32 y.o. female presenting follow up for symptoms of anxiety and depression.  The patient is currently taking Zoloft 100mg  for the management of her symptoms.  She has had any recent situational stressors (recent delivery).  She reports symptoms of anhedonia, insomnia, irritability and social anxiety.  She denies risk taking behavior, agorophobia, feelings of guilt, feelings of worthlessness, suicidal ideation, homicidal ideation, auditory hallucinations and visual hallucinations. Symptoms have worsened since last visit.     The patient does have a pre-existing history of depression and anxiety.  She  does not a prior history of suicide attempts.  Previous treatment tied include Zoloft up to 200mg  daily and Buspar.  Objective: Blood pressure (!) 146/100, pulse (!) 115, height 5\' 3"  (1.6 m), weight 192 lb (87.1 kg), currently breastfeeding.  Admission on 05/09/2018, Discharged on 05/11/2018  Component Date Value Ref Range Status  . WBC 05/10/2018 20.0* 3.6 - 11.0 K/uL Final  . RBC 05/10/2018 3.72* 3.80 - 5.20 MIL/uL Final  . Hemoglobin 05/10/2018 11.1* 12.0 - 16.0 g/dL Final  . HCT 56/21/308608/23/2019 32.2* 35.0 - 47.0 % Final  . MCV 05/10/2018 86.5  80.0 - 100.0 fL Final  . MCH 05/10/2018 29.8  26.0 - 34.0 pg Final  . MCHC 05/10/2018 34.4  32.0 - 36.0 g/dL Final  . RDW 57/84/696208/23/2019 21.4* 11.5 - 14.5 % Final  . Platelets 05/10/2018 138* 150 - 440 K/uL Final   Performed at [facility], [address] [place], [place] [address]    Assessment: 32 y.o. s/p RLTCS stable  Plan: 1) Patient has done well after surgery with no apparent  complications.  I have discussed the post-operative course to date, and the expected progress moving forward.  The patient understands what complications to be concerned about.  I will see the patient in routine follow up, or sooner if needed.    Activity plan: No heavy lifting, pelvic rest  2) Contraception - mini-pill, rx sent  3) Depression - pre-exisiting with postpartum worsening.  History of postpartum depression.  No SI/HI - Increase Zoloft to 150mg  daily - Start Buspar 7.5mg  bid - Vistaril PRN  4) CHTN - BP remains mild range  5) Return in about 5 weeks (around 06/21/2018) for 6 week postpartum.    Vena AustriaAndreas Cortez Flippen, MD, Evern CoreFACOG [facility], [facility] 05/17/2018, 3:05 PM

## 2018-06-06 ENCOUNTER — Other Ambulatory Visit: Payer: Self-pay | Admitting: Obstetrics and Gynecology

## 2018-06-06 ENCOUNTER — Telehealth: Payer: Self-pay

## 2018-06-06 MED ORDER — SERTRALINE HCL 50 MG PO TABS: 150.0000 mg | ORAL_TABLET | Freq: Every day | ORAL | 2 refills | Status: DC

## 2018-06-06 NOTE — Telephone Encounter (Signed)
I changed the Rx to 3 pill of the 50mg  so there isn't confusion a the pharmacy

## 2018-06-06 NOTE — Telephone Encounter (Signed)
Please advise 

## 2018-06-06 NOTE — Telephone Encounter (Signed)
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  [facility] inc dose from 100mg  to 150mg .  He rx'd 50mg  #30 c 3 rf. Pt already had some 100mg  pills from psychiatrist. She ran out of the 100mg  pills and has been taking 3 of the 50mg  to make 150mg /d.She has one left for tonight.  The pharm won't rf the 50mg  #30 d/t a week too soon.  Could [facility] call in rx for the 100mg  pills?  She has a pill cutter. She thinks he is going to have to up the dose to 200mg  b/c they talked about that.  Doesn't know if he can go ahead a do that or if he needs to wait for her appt on 10/3rd to do that.  [phone]

## 2018-06-07 ENCOUNTER — Telehealth: Payer: Self-pay

## 2018-06-07 ENCOUNTER — Other Ambulatory Visit: Payer: Self-pay | Admitting: Obstetrics and Gynecology

## 2018-06-07 MED ORDER — SERTRALINE HCL 100 MG PO TABS: 100.0000 mg | ORAL_TABLET | Freq: Every day | ORAL | 2 refills | Status: DC

## 2018-06-07 MED ORDER — SERTRALINE HCL 50 MG PO TABS: 50.0000 mg | ORAL_TABLET | Freq: Every day | ORAL | 2 refills | Status: DC

## 2018-06-07 NOTE — Telephone Encounter (Signed)
Pt states her insurance will not cover 3 pills a day, the pharmacy states the prescription for zoloft  needs to be 100mg  and a 50mg  pill, please advise

## 2018-06-07 NOTE — Telephone Encounter (Signed)
Please advise 

## 2018-06-07 NOTE — Telephone Encounter (Signed)
LVM for patient to call me back 

## 2018-06-07 NOTE — Telephone Encounter (Signed)
Patient is returning missed call. Please advise 

## 2018-06-07 NOTE — Telephone Encounter (Signed)
THAT IS WHAT THE INITIAL Rx was, I've resent it the pharmacy should have 2 prescriptions 100mg  and 50mg  for a combined dose of 150mg 

## 2018-06-07 NOTE — Telephone Encounter (Signed)
I spoke to patient and she states the pharmacy never received the initial 100mg . Pt aware both rx's have been sent in today. KJ CMA

## 2018-06-21 ENCOUNTER — Ambulatory Visit (INDEPENDENT_AMBULATORY_CARE_PROVIDER_SITE_OTHER): Payer: Managed Care, Other (non HMO) | Admitting: Obstetrics and Gynecology

## 2018-06-21 ENCOUNTER — Encounter: Payer: Self-pay | Admitting: Obstetrics and Gynecology

## 2018-06-21 ENCOUNTER — Other Ambulatory Visit (HOSPITAL_COMMUNITY)
Admission: RE | Admit: 2018-06-21 | Discharge: 2018-06-21 | Disposition: A | Discharge status: Home / Self Care | Payer: Managed Care, Other (non HMO) | Source: Ambulatory Visit | Attending: Obstetrics and Gynecology | Admitting: Obstetrics and Gynecology

## 2018-06-21 DIAGNOSIS — O99345 Other mental disorders complicating the puerperium: Secondary | ICD-10-CM

## 2018-06-21 DIAGNOSIS — Z124 Encounter for screening for malignant neoplasm of cervix: Secondary | ICD-10-CM | POA: Diagnosis not present

## 2018-06-21 DIAGNOSIS — F53 Postpartum depression: Secondary | ICD-10-CM

## 2018-06-21 MED ORDER — NYSTATIN 100000 UNIT/GM EX CREA: 1.0000 "application " | TOPICAL_CREAM | Freq: Two times a day (BID) | CUTANEOUS | 1 refills | Status: DC

## 2018-06-21 MED ORDER — SERTRALINE HCL 100 MG PO TABS: 200.0000 mg | ORAL_TABLET | Freq: Every day | ORAL | 2 refills | Status: DC

## 2018-06-21 NOTE — Progress Notes (Signed)
Postpartum Visit  Chief Complaint:  Chief Complaint  Patient presents with  . Postpartum Care    6wpp    History of Present Illness: Patient is a 32 y.o. [id number] presents for postpartum visit.  Date of delivery: 05/09/2018 Cesarean Section: Failure to progress Pregnancy or labor problems:  Jani Gravel Any problems since the delivery:  no1  Newborn Details:  SINGLETON :  1. BabyGender female. Birth weight:   Maternal Details:  Breast or formula feeding: plans to breastfeed  Contraception after delivery: Yes  Any bowel or bladder issues: No  Post partum depression/anxiety noted:  yes Edinburgh Post-Partum Depression Score:13  Review of Systems: Review of Systems  Constitutional: Negative.   Gastrointestinal: Negative.   Genitourinary: Negative.   Skin: Negative.   Psychiatric/Behavioral: Positive for depression. Negative for hallucinations, memory loss, substance abuse and suicidal ideas. The patient is nervous/anxious and has insomnia.     The following portions of the patient's history were reviewed and updated as appropriate: allergies, current medications, past family history, past medical history, past social history, past surgical history and problem list.  Past Medical History:  Past Medical History:  Diagnosis Date  . Anxiety   . History of hiatal hernia   . Hypertension    with pregnancy. reads normal at home no meds  . 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 Palpitations   . Scoliosis   . Trouble in sleeping     Past Surgical History:  Past Surgical History:  Procedure Laterality Date  . CESAREAN SECTION N/A 03/04/2013   Procedure: CESAREAN SECTION;  Surgeon: Bing Plume, MD;  Location: [facility];  Service: Obstetrics;  Laterality: N/A;  . CESAREAN SECTION N/A 05/09/2018   Procedure: CESAREAN SECTION;  Surgeon: [name], MD;  Location: [facility];  Service: Obstetrics;  Laterality: N/A;    Family History:  Family History  Problem Relation Age  of Onset  . Diabetes Mother   . Hypertension Mother   . Kidney disease Mother   . Hypertension Father     Social History:  Social History   Socioeconomic History  . Marital status: Married    Spouse name: Not on file  . Number of children: 1  . Years of education: 67  . Highest education level: Not on file  Occupational History  . Occupation: self employed  Social Needs  . Financial resource strain: Not on file  . Food insecurity:    Worry: Not on file    Inability: Not on file  . Transportation needs:    Medical: Not on file    Non-medical: Not on file  Tobacco Use  . Smoking status: Never Smoker  . Smokeless tobacco: Never Used  Substance and Sexual Activity  . Alcohol use: No  . Drug use: No  . Sexual activity: Yes    Birth control/protection: None, Pill  Lifestyle  . Physical activity:    Days per week: Not on file    Minutes per session: Not on file  . Stress: Not on file  Relationships  . Social connections:    Talks on phone: Not on file    Gets together: Not on file    Attends religious service: Not on file    Active member of club or organization: Not on file    Attends meetings of clubs or organizations: Not on file    Relationship status: Not on file  . Intimate partner violence:    Fear of current or  ex partner: Not on file    Emotionally abused: Not on file    Physically abused: Not on file    Forced sexual activity: Not on file  Other Topics Concern  . Not on file  Social History Narrative  . Not on file    Allergies:  Allergies  Allergen Reactions  . Amoxicillin Itching    Has patient had a PCN reaction causing immediate rash, facial/tongue/throat swelling, SOB or lightheadedness with hypotension: No Has patient had a PCN reaction causing severe rash involving mucus membranes or skin necrosis: No Has patient had a PCN reaction that required hospitalization: No Has patient had a PCN reaction occurring within the last 10 years: No If all  of the above answers are "NO", then may proceed with Cephalosporin use.   Marland Kitchen Keflex [Cephalexin] Itching    Medications: Prior to Admission medications   Medication Sig Start Date End Date Taking? Authorizing Provider  acetaminophen (TYLENOL) 500 MG tablet Take 1,000 mg by mouth every 6 (six) hours as needed (for pain).    [provider]  busPIRone (BUSPAR) 7.5 MG tablet Take 1 tablet (7.5 mg total) by mouth 2 (two) times daily. 05/17/18   [name], MD  cetirizine (ZYRTEC) 10 MG tablet Take 10 mg by mouth every evening.     [provider]  diphenhydrAMINE (BENADRYL) 25 MG tablet Take 25 mg by mouth every 6 (six) hours as needed.    [provider]  esomeprazole (NEXIUM) 20 MG capsule Take 20 mg by mouth every evening.    [provider]  hydrOXYzine (ATARAX/VISTARIL) 25 MG tablet Take 1 tablet (25 mg total) by mouth every 6 (six) hours as needed for anxiety. 05/17/18   [name], MD  norethindrone (MICRONOR,CAMILA,ERRIN) 0.35 MG tablet Take 1 tablet (0.35 mg total) by mouth daily. 05/17/18   [name], MD  oxyCODONE-acetaminophen (PERCOCET) 5-325 MG tablet Take 1 tablet by mouth every 4 (four) hours as needed for moderate pain or severe pain. 05/11/18   Nadara Mustard, MD  Prenatal Vit-Fe Fumarate-FA (PRENATAL MULTIVITAMIN) TABS Take 1 tablet by mouth every evening.     [provider]  sertraline (ZOLOFT) 100 MG tablet Take 1 tablet (100 mg total) by mouth daily. With you 50mg  tablet for a combined dose of 150mg  06/07/18   [name], MD  sertraline (ZOLOFT) 50 MG tablet Take 3 tablets (150 mg total) by mouth daily. In addition to your 100mg  Zoloft tablet for a combined dose of 150mg  06/06/18   [name], MD  sertraline (ZOLOFT) 50 MG tablet Take 1 tablet (50 mg total) by mouth daily. In addition to your 100mg  Zoloft tablet for a combined dose of 150mg  06/07/18   [name], MD    Physical Exam Blood  pressure (!) 152/100, pulse (!) 116, height 5\' 3"  (1.6 m), weight 189 lb (85.7 kg), currently breastfeeding.   General: NAD HEENT: normocephalic, anicteric Pulmonary: No increased work of breathing Abdomen: NABS, soft, non-tender, non-distended.  Umbilicus without lesions.  No hepatomegaly, splenomegaly or masses palpable. No evidence of hernia. Incision D/C/I Genitourinary:  External: Normal external female genitalia.  Normal urethral meatus, normal  Bartholin's and Skene's glands.    Vagina: Normal vaginal mucosa, no evidence of prolapse.    Cervix: Grossly normal in appearance, no bleeding  Uterus: Non-enlarged, mobile, normal contour.  No CMT  Adnexa: ovaries non-enlarged, no adnexal masses  Rectal: deferred Extremities: no edema, erythema, or tenderness Neurologic: Grossly intact Psychiatric: mood appropriate, affect  full    Assessment: 32 y.o. [id number] presenting for 6 week postpartum visit  Plan: Problem List Items Addressed This Visit    None      1) Contraception - Education given regarding options for contraception, as well as compatibility with breast feeding if applicable.  Patient plans on oral progesterone-only contraceptive for contraception.  2)  Pap - ASCCP guidelines and rational discussed.  ASCCP guidelines and rational discussed.  Patient opts for every 3 years screening interval  3) Patient underwent screening for postpartum depression with continues symptoms of anxiety and depression - currently on Zoloft 150mg  and buspar 7.5mg  - increase Zoloft to 200mg  today  4) Nystatin rx for tinea corporis  5) skin tage perineal possible old hemerhoid vs skin tag follow up next cisit  6)  No follow-ups on file.   [name], MD, [facility], [facility] 06/21/2018, 2:54 PM

## 2018-06-26 LAB — CYTOLOGY - PAP
DIAGNOSIS: NEGATIVE
HPV (WINDOPATH): NOT DETECTED

## 2018-07-10 IMAGING — US US OB COMP LESS 14 WK
1 series · 14 of 28 positions shown · non-contrast
Comparison: None.

CLINICAL DATA: Vaginal bleeding. Gestational age by LMP of 12 weeks
6 days.

EXAM:
OBSTETRIC <14 WK ULTRASOUND
TECHNIQUE: Transabdominal ultrasound was performed for evaluation of the
gestation as well as the maternal uterus and adnexal regions.

[Series 1: us ob comp less 14 wk · 0.20mm/px · 14 of 58 slices shown]
[im 3/58]
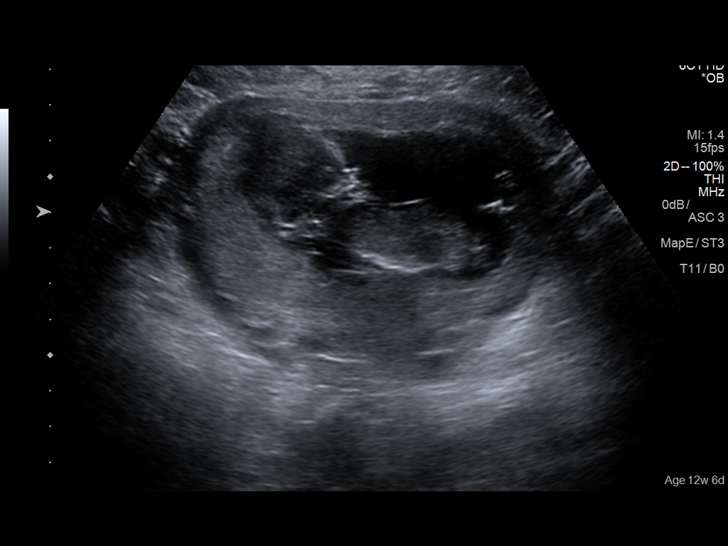
[im 7/58]
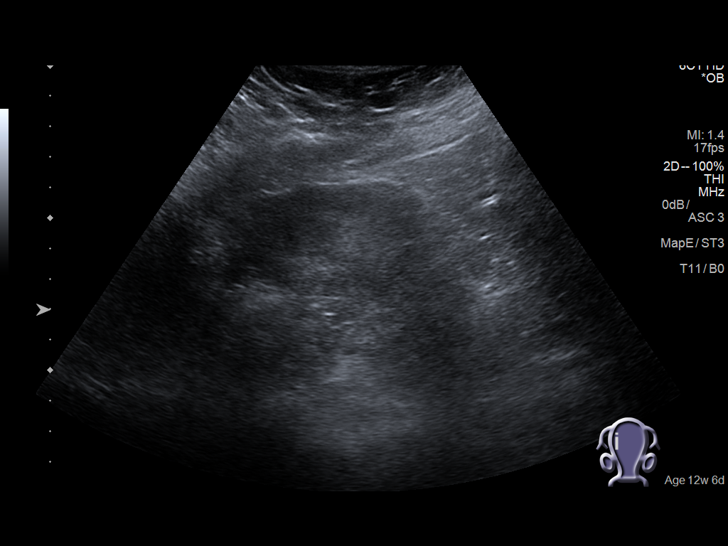
[im 11/58]
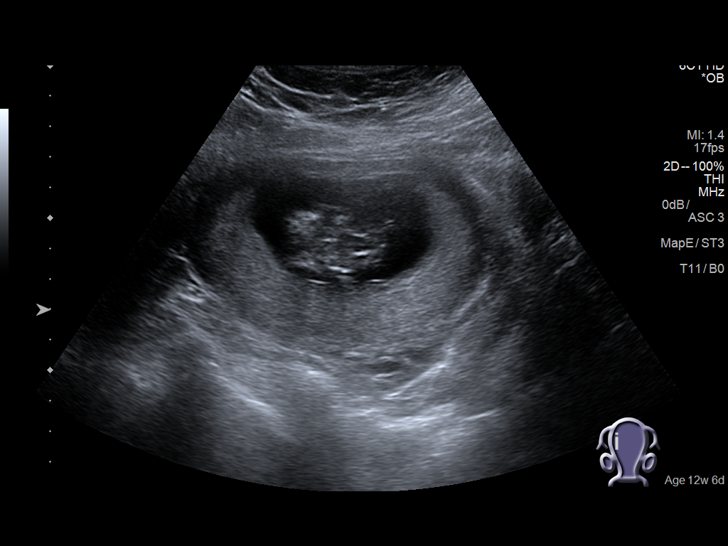
[im 15/58]
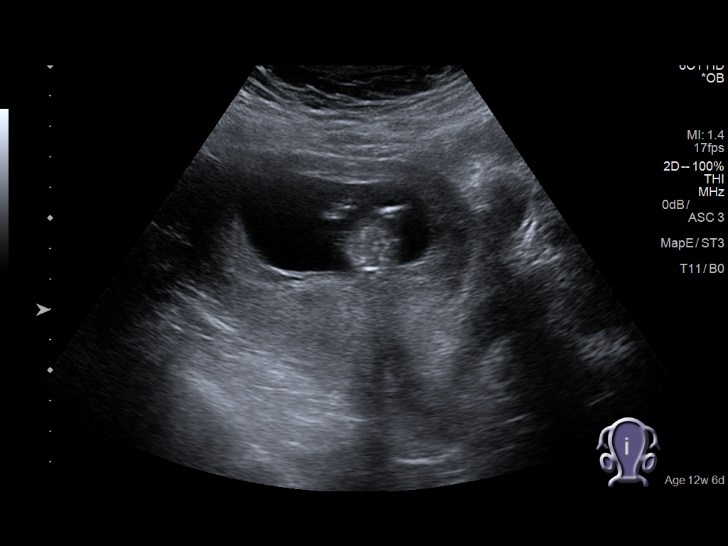
[im 20/58]
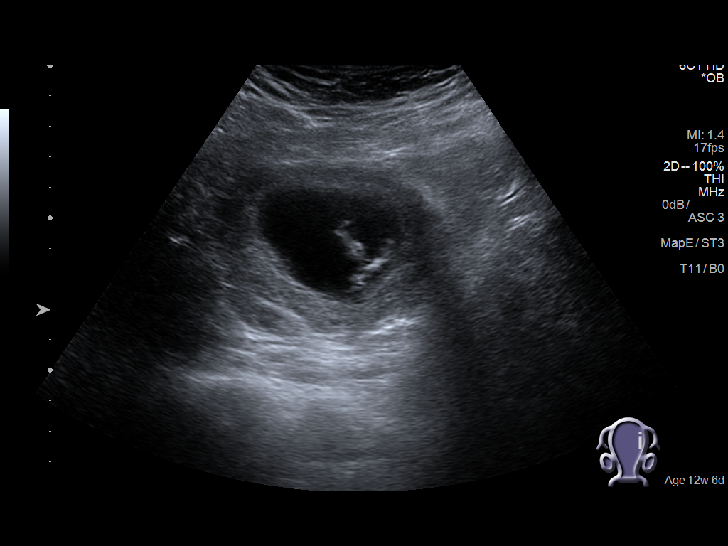
[im 24/58]
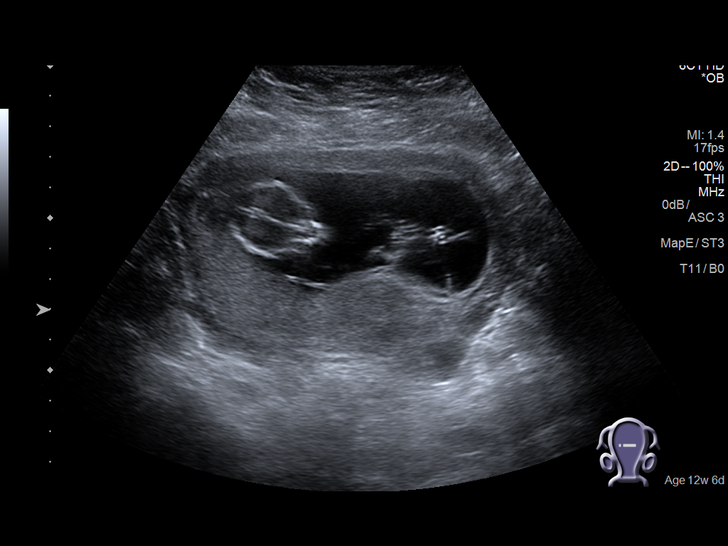
[im 28/58]
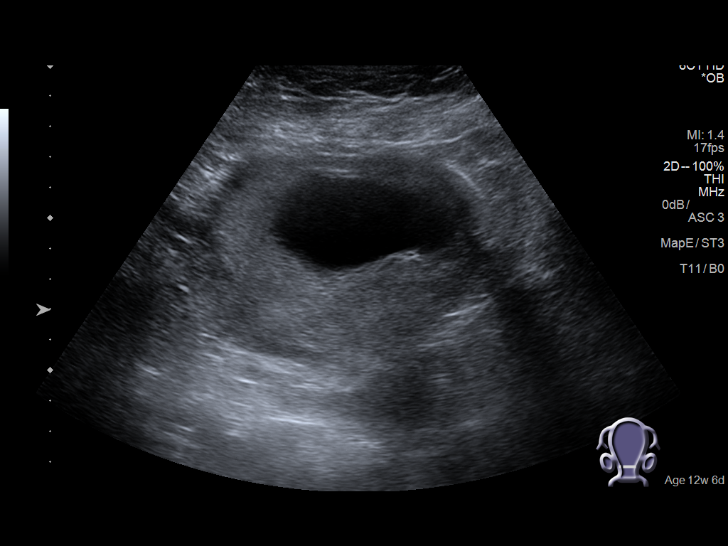
[im 32/58]
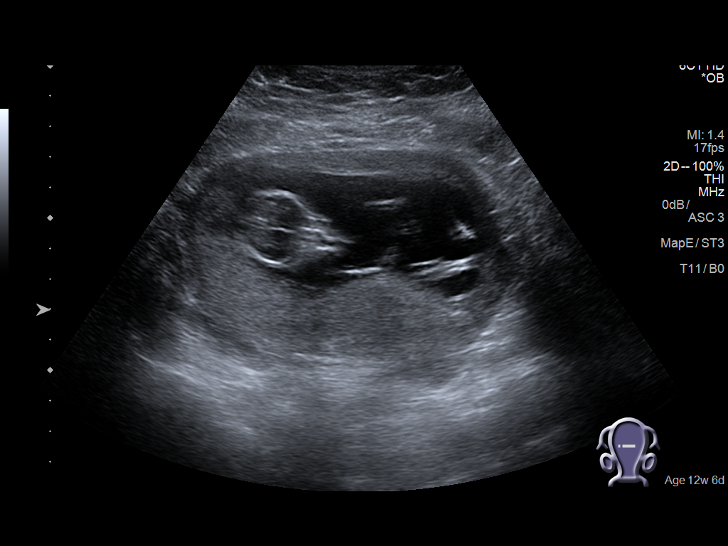
[im 36/58]
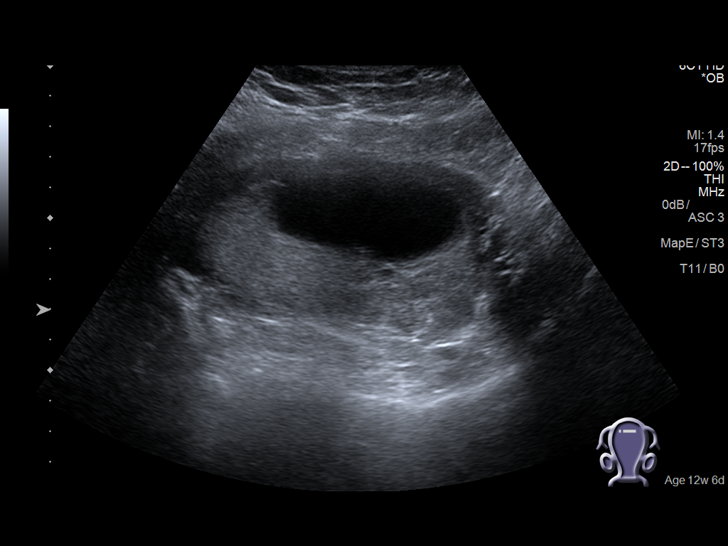
[im 41/58]
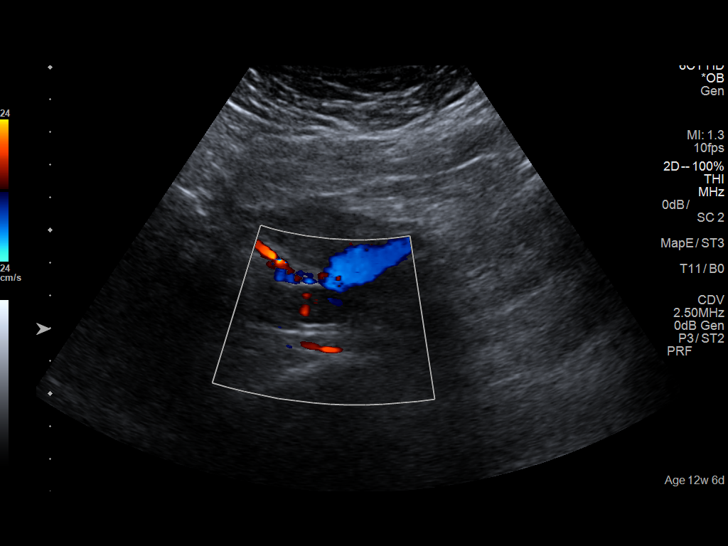
[im 45/58]
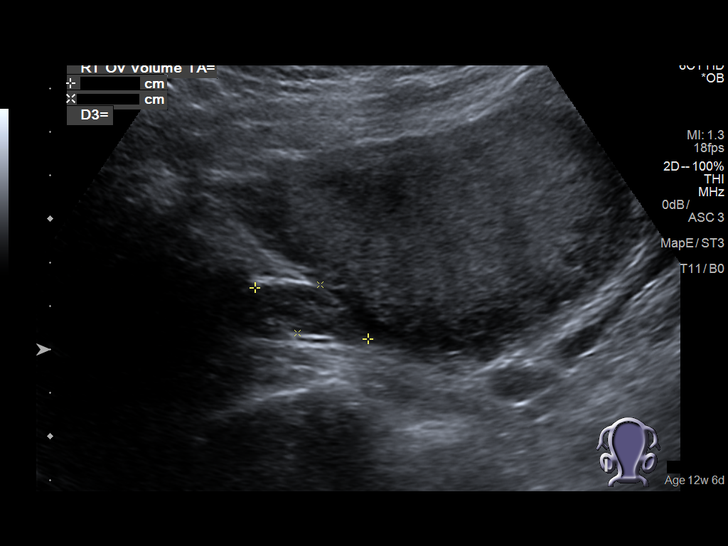
[im 49/58]
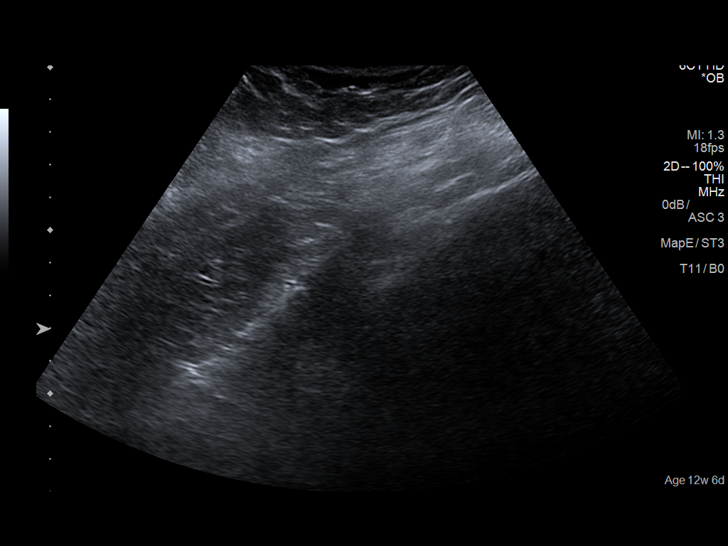
[im 53/58]
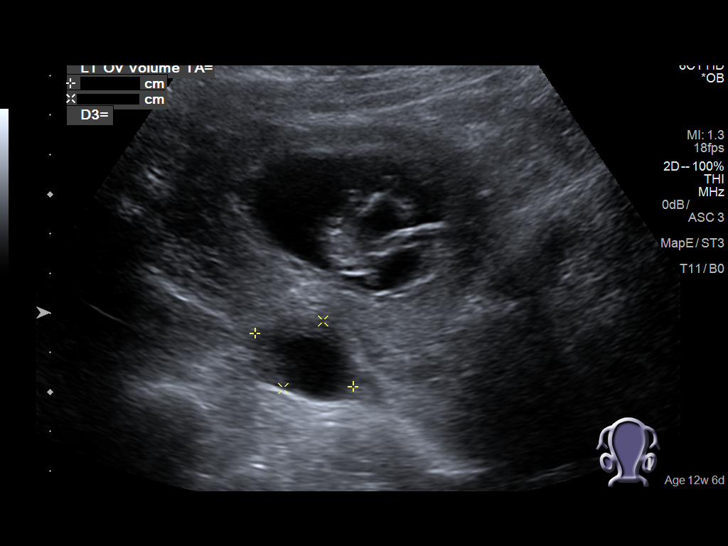
[im 58/58]
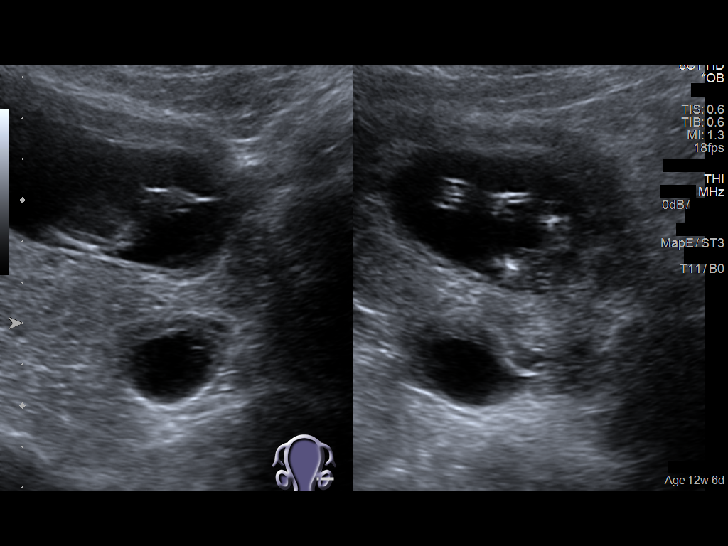

[14 of 28 positions shown; findings below may reference images not displayed]

FINDINGS: Intrauterine gestational sac: Single

Yolk sac:  Not Visualized.

Embryo:  Visualized.

Cardiac Activity: Visualized.

Heart Rate: 173 bpm

CRL:   72 mm   13 w 3 d                  US EDC: 05/16/2018

Subchorionic hemorrhage:  None visualized.

Maternal uterus/adnexae: Small left ovarian corpus luteum cyst
noted. Normal appearance of right ovary. No evidence of mass or
abnormal free fluid.
IMPRESSION: Single living IUP measuring 13 weeks 3 days, which is concordant
with LMP.

No significant maternal uterine or adnexal abnormality identified.

## 2018-07-19 ENCOUNTER — Encounter: Payer: Self-pay | Admitting: Obstetrics and Gynecology

## 2018-07-19 ENCOUNTER — Ambulatory Visit (INDEPENDENT_AMBULATORY_CARE_PROVIDER_SITE_OTHER): Payer: Managed Care, Other (non HMO) | Admitting: Obstetrics and Gynecology

## 2018-07-19 VITALS — BP 148/100 | HR 112 | Ht 63.0 in | Wt 194.0 lb

## 2018-07-19 DIAGNOSIS — F418 Other specified anxiety disorders: Secondary | ICD-10-CM

## 2018-07-19 MED ORDER — SERTRALINE HCL 100 MG PO TABS: 200.0000 mg | ORAL_TABLET | Freq: Every day | ORAL | 11 refills | Status: DC

## 2018-07-19 MED ORDER — BUSPIRONE HCL 15 MG PO TABS: 15.0000 mg | ORAL_TABLET | Freq: Two times a day (BID) | ORAL | 2 refills | Status: DC

## 2018-07-19 NOTE — Progress Notes (Signed)
Obstetrics & Gynecology Office Visit   Chief Complaint:  Chief Complaint  Patient presents with  . Follow-up    Anxiety    History of Present Illness: The patient is a 32 y.o. female presenting follow up for symptoms of anxiety and depression.  The patient is currently taking Zoloft 200mg  and Buspar 7.5mg  bid for the management of her symptoms.  She has had any recent situational stressors, child birth.  She reports symptoms of insomnia, irritability and social anxiety.  She denies anhedonia, risk taking behavior, agorophobia, feelings of worthlessness, suicidal ideation, homicidal ideation, auditory hallucinations and visual hallucinations. Symptoms have improved since last visit.     The patient does have a pre-existing history of depression and anxiety.  She  does not a prior history of suicide attempts. .  Review of Systems: Review of Systems  Constitutional: Negative.   Gastrointestinal: Negative for nausea and vomiting.  Neurological: Negative for headaches.  Psychiatric/Behavioral: Negative for depression, hallucinations, memory loss, substance abuse and suicidal ideas. The patient is nervous/anxious and has insomnia.      Past Medical History:  Past Medical History:  Diagnosis Date  . Anxiety   . History of hiatal hernia   . Hypertension    with pregnancy. reads normal at home no meds  . 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 Palpitations   . Scoliosis   . Trouble in sleeping     Past Surgical History:  Past Surgical History:  Procedure Laterality Date  . CESAREAN SECTION N/A 03/04/2013   Procedure: CESAREAN SECTION;  Surgeon: Bing Plume, MD;  Location: [facility];  Service: Obstetrics;  Laterality: N/A;  . CESAREAN SECTION N/A 05/09/2018   Procedure: CESAREAN SECTION;  Surgeon: [name], MD;  Location: [facility];  Service: Obstetrics;  Laterality: N/A;    Gynecologic History: No LMP recorded.  Obstetric History: [id number]  Family History:  Family  History  Problem Relation Age of Onset  . Diabetes Mother   . Hypertension Mother   . Kidney disease Mother   . Hypertension Father     Social History:  Social History   Socioeconomic History  . Marital status: Married    Spouse name: Not on file  . Number of children: 1  . Years of education: 56  . Highest education level: Not on file  Occupational History  . Occupation: self employed  Social Needs  . Financial resource strain: Not on file  . Food insecurity:    Worry: Not on file    Inability: Not on file  . Transportation needs:    Medical: Not on file    Non-medical: Not on file  Tobacco Use  . Smoking status: Never Smoker  . Smokeless tobacco: Never Used  Substance and Sexual Activity  . Alcohol use: No  . Drug use: No  . Sexual activity: Not Currently    Birth control/protection: None  Lifestyle  . Physical activity:    Days per week: Not on file    Minutes per session: Not on file  . Stress: Not on file  Relationships  . Social connections:    Talks on phone: Not on file    Gets together: Not on file    Attends religious service: Not on file    Active member of club or organization: Not on file    Attends meetings of clubs or organizations: Not on file    Relationship status: Not on file  . Intimate partner violence:  Fear of current or ex partner: Not on file    Emotionally abused: Not on file    Physically abused: Not on file    Forced sexual activity: Not on file  Other Topics Concern  . Not on file  Social History Narrative  . Not on file    Allergies:  Allergies  Allergen Reactions  . Amoxicillin Itching    Has patient had a PCN reaction causing immediate rash, facial/tongue/throat swelling, SOB or lightheadedness with hypotension: No Has patient had a PCN reaction causing severe rash involving mucus membranes or skin necrosis: No Has patient had a PCN reaction that required hospitalization: No Has patient had a PCN reaction occurring  within the last 10 years: No If all of the above answers are "NO", then may proceed with Cephalosporin use.   Marland Kitchen Keflex [Cephalexin] Itching    Medications: Prior to Admission medications   Medication Sig Start Date End Date Taking? Authorizing Provider  acetaminophen (TYLENOL) 500 MG tablet Take 1,000 mg by mouth every 6 (six) hours as needed (for pain).    [provider]  busPIRone (BUSPAR) 15 MG tablet Take 1 tablet (15 mg total) by mouth 2 (two) times daily. 07/19/18   [name], MD  cetirizine (ZYRTEC) 10 MG tablet Take 10 mg by mouth every evening.     [provider]  diphenhydrAMINE (BENADRYL) 25 MG tablet Take 25 mg by mouth every 6 (six) hours as needed.    [provider]  esomeprazole (NEXIUM) 20 MG capsule Take 20 mg by mouth every evening.    [provider]  hydrOXYzine (ATARAX/VISTARIL) 25 MG tablet Take 1 tablet (25 mg total) by mouth every 6 (six) hours as needed for anxiety. 05/17/18   [name], MD  norethindrone (MICRONOR,CAMILA,ERRIN) 0.35 MG tablet Take 1 tablet (0.35 mg total) by mouth daily. Patient not taking: Reported on 06/21/2018 05/17/18   [name], MD  nystatin cream (MYCOSTATIN) Apply 1 application topically 2 (two) times daily. 06/21/18   [name], MD  Prenatal Vit-Fe Fumarate-FA (PRENATAL MULTIVITAMIN) TABS Take 1 tablet by mouth every evening.     [provider]  sertraline (ZOLOFT) 100 MG tablet Take 2 tablets (200 mg total) by mouth daily. 07/19/18   [name], MD    Physical Exam Vitals:  Vitals:   07/19/18 1511  BP: (!) 148/100  Pulse: (!) 112   No LMP recorded.  General: NAD HEENT: normocephalic, anicteric Pulmonary: No increased work of breathing Neurologic: Grossly intact Psychiatric: mood appropriate, affect full    GAD 7 : Generalized Anxiety Score 07/19/2018  Nervous, Anxious, on Edge 3  Control/stop worrying 1  Worry too much - different things 1    Trouble relaxing 1  Restless 1  Easily annoyed or irritable 1  Afraid - awful might happen 2  Total GAD 7 Score 10  Anxiety Difficulty Somewhat difficult   Depression screen PHQ 2/9 07/19/2018  Decreased Interest 0  Down, Depressed, Hopeless 0  PHQ - 2 Score 0  Altered sleeping 0  Tired, decreased energy 0  Change in appetite 0  Feeling bad or failure about yourself  0  Trouble concentrating 0  Moving slowly or fidgety/restless 0  Suicidal thoughts 0  PHQ-9 Score 0     Assessment: 32 y.o. [id number] follow up depression and anxiety  Plan: Problem List Items Addressed This Visit    None      1) Good improvement on PHQ-9 and depression symptoms, still having some  anxiety.  Maintain Zoloft at 200mg  and increased Buspar to 15mg  bid  2) A total of 15 minutes were spent in face-to-face contact with the patient during this encounter with over half of that time devoted to counseling and coordination of care.  3) Return in about 4 weeks (around 08/16/2018) for medication follow up.   [name], MD, [facility], [facility] 07/19/2018, 3:28 PM

## 2018-08-19 ENCOUNTER — Encounter: Payer: Self-pay | Admitting: Obstetrics and Gynecology

## 2018-08-19 ENCOUNTER — Ambulatory Visit (INDEPENDENT_AMBULATORY_CARE_PROVIDER_SITE_OTHER): Payer: Managed Care, Other (non HMO) | Admitting: Obstetrics and Gynecology

## 2018-08-19 VITALS — BP 148/108 | HR 107 | Wt 197.0 lb

## 2018-08-19 DIAGNOSIS — F419 Anxiety disorder, unspecified: Secondary | ICD-10-CM

## 2018-08-19 DIAGNOSIS — F329 Major depressive disorder, single episode, unspecified: Secondary | ICD-10-CM | POA: Diagnosis not present

## 2018-08-19 MED ORDER — BUSPIRONE HCL 15 MG PO TABS: 15.0000 mg | ORAL_TABLET | Freq: Two times a day (BID) | ORAL | 2 refills | Status: DC

## 2018-08-19 NOTE — Progress Notes (Addendum)
Obstetrics & Gynecology Office Visit   Chief Complaint:  Chief Complaint  Patient presents with  . Follow-up    Depression/Anxiery.Marland Kitchendiscuss OCP    History of Present Illness: The patient is a 32 y.o. female presenting follow up for symptoms of anxiety and depression.  The patient is currently taking Zoloft and buspar for the management of her symptoms.  She has had any recent situational stressors, child birth.  She reports good improvement in symptoms. She denies anhedonia, day time somnolence, insomnia, risk taking behavior, irritability, social anxiety, agorophobia, feelings of guilt, feelings of worthlessness, suicidal ideation, homicidal ideation, auditory hallucinations and visual hallucinations. Symptoms have improved since last visit.     The patient does not have a pre-existing history of depression and anxiety.  She  does not a prior history of suicide attempts.   Review of Systems: Review of Systems  Constitutional: Negative.   Gastrointestinal: Negative for nausea and vomiting.  Neurological: Negative for headaches.  Psychiatric/Behavioral: Negative.     Past Medical History:  Past Medical History:  Diagnosis Date  . Anxiety   . History of hiatal hernia   . Hypertension    with pregnancy. reads normal at home no meds  . 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 Palpitations   . Scoliosis   . Trouble in sleeping     Past Surgical History:  Past Surgical History:  Procedure Laterality Date  . CESAREAN SECTION N/A 03/04/2013   Procedure: CESAREAN SECTION;  Surgeon: Bing Plume, MD;  Location: [facility];  Service: Obstetrics;  Laterality: N/A;  . CESAREAN SECTION N/A 05/09/2018   Procedure: CESAREAN SECTION;  Surgeon: [name], MD;  Location: [facility];  Service: Obstetrics;  Laterality: N/A;    Gynecologic History: No LMP recorded.  Obstetric History: [id number]  Family History:  Family History  Problem Relation Age of Onset  . Diabetes Mother   .  Hypertension Mother   . Kidney disease Mother   . Hypertension Father     Social History:  Social History   Socioeconomic History  . Marital status: Married    Spouse name: Not on file  . Number of children: 1  . Years of education: 30  . Highest education level: Not on file  Occupational History  . Occupation: self employed  Social Needs  . Financial resource strain: Not on file  . Food insecurity:    Worry: Not on file    Inability: Not on file  . Transportation needs:    Medical: Not on file    Non-medical: Not on file  Tobacco Use  . Smoking status: Never Smoker  . Smokeless tobacco: Never Used  Substance and Sexual Activity  . Alcohol use: No  . Drug use: No  . Sexual activity: Not Currently    Birth control/protection: None  Lifestyle  . Physical activity:    Days per week: Not on file    Minutes per session: Not on file  . Stress: Not on file  Relationships  . Social connections:    Talks on phone: Not on file    Gets together: Not on file    Attends religious service: Not on file    Active member of club or organization: Not on file    Attends meetings of clubs or organizations: Not on file    Relationship status: Not on file  . Intimate partner violence:    Fear of current or ex partner: Not on file  Emotionally abused: Not on file    Physically abused: Not on file    Forced sexual activity: Not on file  Other Topics Concern  . Not on file  Social History Narrative  . Not on file    Allergies:  Allergies  Allergen Reactions  . Amoxicillin Itching    Has patient had a PCN reaction causing immediate rash, facial/tongue/throat swelling, SOB or lightheadedness with hypotension: No Has patient had a PCN reaction causing severe rash involving mucus membranes or skin necrosis: No Has patient had a PCN reaction that required hospitalization: No Has patient had a PCN reaction occurring within the last 10 years: No If all of the above answers are "NO",  then may proceed with Cephalosporin use.   Marland Kitchen. Keflex [Cephalexin] Itching    Medications: Prior to Admission medications   Medication Sig Start Date End Date Taking? Authorizing Provider  acetaminophen (TYLENOL) 500 MG tablet Take 1,000 mg by mouth every 6 (six) hours as needed (for pain).   Yes [provider]  busPIRone (BUSPAR) 15 MG tablet Take 1 tablet (15 mg total) by mouth 2 (two) times daily. 08/19/18  Yes Vena AustriaStaebler, Carlina Derks, MD  cetirizine (ZYRTEC) 10 MG tablet Take 10 mg by mouth every evening.    Yes [provider]  diphenhydrAMINE (BENADRYL) 25 MG tablet Take 25 mg by mouth every 6 (six) hours as needed.   Yes [provider]  esomeprazole (NEXIUM) 20 MG capsule Take 20 mg by mouth every evening.   Yes [provider]  hydrOXYzine (ATARAX/VISTARIL) 25 MG tablet Take 1 tablet (25 mg total) by mouth every 6 (six) hours as needed for anxiety. 05/17/18  Yes Vena AustriaStaebler, Abigial Newville, MD  norethindrone (MICRONOR,CAMILA,ERRIN) 0.35 MG tablet Take 1 tablet (0.35 mg total) by mouth daily. 05/17/18  Yes Vena AustriaStaebler, Pippa Hanif, MD  nystatin cream (MYCOSTATIN) Apply 1 application topically 2 (two) times daily. 06/21/18  Yes Vena AustriaStaebler, Houda Brau, MD  Prenatal Vit-Fe Fumarate-FA (PRENATAL MULTIVITAMIN) TABS Take 1 tablet by mouth every evening.    Yes [provider]  sertraline (ZOLOFT) 100 MG tablet Take 2 tablets (200 mg total) by mouth daily. 07/19/18  Yes Vena AustriaStaebler, Raymar Joiner, MD    Physical Exam Vitals:  Vitals:   08/19/18 1453  BP: (!) 148/108  Pulse: (!) 107   No LMP recorded.  General: NAD HEENT: normocephalic, anicteric Pulmonary: No increased work of breathing Neurologic: Grossly intact Psychiatric: mood appropriate, affect full    GAD 7 : Generalized Anxiety Score 08/19/2018 07/19/2018  Nervous, Anxious, on Edge 1 3  Control/stop worrying 1 1  Worry too much - different things 1 1  Trouble relaxing 0 1  Restless 0 1  Easily annoyed or irritable  1 1  Afraid - awful might happen 1 2  Total GAD 7 Score 5 10  Anxiety Difficulty Not difficult at all Somewhat difficult    [facility] 08/[facility]/2018 07/19/2018  Decreased Interest 0 0  Down, Depressed, Hopeless 1 0  PHQ - 2 Score 1 0  Altered sleeping 0 0  Tired, decreased energy 0 0  Change in appetite 1 0  Feeling bad or failure about yourself  0 0  Trouble concentrating 0 0  Moving slowly or fidgety/restless 0 0  Suicidal thoughts 0 0  PHQ-9 Score 2 0    [facility] 08/[facility]/2018 07/19/2018  Decreased Interest 0 0  Down, Depressed, Hopeless 1 0  PHQ - 2 Score 1 0  Altered sleeping 0 0  Tired,  decreased energy 0 0  Change in appetite 1 0  Feeling bad or failure about yourself  0 0  Trouble concentrating 0 0  Moving slowly or fidgety/restless 0 0  Suicidal thoughts 0 0  PHQ-9 Score 2 0     Assessment: 32 y.o. Z6X0960 follow up depression and anxiety  Plan: Problem List Items Addressed This Visit    None    Visit Diagnoses    Anxiety and depression    -  Primary   Relevant Medications   busPIRone (BUSPAR) 15 MG tablet      1) Depression and anxiety  - PHQ-9 is 2 - GAD-7 is 5 - Continue zoloft and busapr at current dose  2) Thyroid and B12 screen has not been obtained previously   [name], MD, [facility], [facility]

## 2018-10-21 ENCOUNTER — Ambulatory Visit: Payer: Managed Care, Other (non HMO) | Admitting: Obstetrics and Gynecology

## 2018-10-29 ENCOUNTER — Encounter: Payer: Self-pay | Admitting: Obstetrics and Gynecology

## 2018-10-29 ENCOUNTER — Ambulatory Visit (INDEPENDENT_AMBULATORY_CARE_PROVIDER_SITE_OTHER): Payer: Managed Care, Other (non HMO) | Admitting: Obstetrics and Gynecology

## 2018-10-29 VITALS — BP 142/98 | HR 117 | Wt 193.0 lb

## 2018-10-29 DIAGNOSIS — F418 Other specified anxiety disorders: Secondary | ICD-10-CM | POA: Diagnosis not present

## 2018-10-29 MED ORDER — SERTRALINE HCL 100 MG PO TABS: 200.0000 mg | ORAL_TABLET | Freq: Every day | ORAL | 3 refills | Status: DC

## 2018-10-29 MED ORDER — BUSPIRONE HCL 15 MG PO TABS: 15.0000 mg | ORAL_TABLET | Freq: Two times a day (BID) | ORAL | 3 refills | Status: DC

## 2018-10-29 NOTE — Progress Notes (Signed)
Obstetrics & Gynecology Office Visit   Chief Complaint:  Chief Complaint  Patient presents with  . Follow-up    anxiety depression    History of Present Illness: The patient is a 33 y.o. female presenting follow up for symptoms of anxiety and depression.  The patient is currently taking Zoloft 200mg  po daily and buspar 15mg  po bid for the management of her symptoms.  She has not had any recent situational stressors.  She reports full remission of symptoms.  She denies anhedonia, day time somnolence, insomnia, risk taking behavior, irritability, social anxiety, agorophobia, feelings of guilt, feelings of worthlessness, suicidal ideation, homicidal ideation, auditory hallucinations and visual hallucinations. Symptoms have improved since last visit.     The patient does have a pre-existing history of depression and anxiety.  She  does not a prior history of suicide attempts.    Review of Systems: Review of Systems  Constitutional: Negative.   Gastrointestinal: Negative for nausea and vomiting.  Neurological: Negative for headaches.  Psychiatric/Behavioral: Negative.    Past Medical History:  Past Medical History:  Diagnosis Date  . Anxiety   . History of hiatal hernia   . Hypertension    with pregnancy. reads normal at home no meds  . 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 Palpitations   . Scoliosis   . Trouble in sleeping     Past Surgical History:  Past Surgical History:  Procedure Laterality Date  . CESAREAN SECTION N/A 03/04/2013   Procedure: CESAREAN SECTION;  Surgeon: Bing Plumehomas F Henley, MD;  Location: [facility];  Service: Obstetrics;  Laterality: N/A;  . CESAREAN SECTION N/A 05/09/2018   Procedure: CESAREAN SECTION;  Surgeon: Vena AustriaStaebler, Emon Lance, MD;  Location: [facility];  Service: Obstetrics;  Laterality: N/A;    Gynecologic History: No LMP recorded.  Obstetric History: [id number]  Family History:  Family History  Problem Relation Age of Onset  . Diabetes Mother   .  Hypertension Mother   . Kidney disease Mother   . Hypertension Father     Social History:  Social History   Socioeconomic History  . Marital status: Married    Spouse name: Not on file  . Number of children: 1  . Years of education: 6718  . Highest education level: Not on file  Occupational History  . Occupation: self employed  Social Needs  . Financial resource strain: Not on file  . Food insecurity:    Worry: Not on file    Inability: Not on file  . Transportation needs:    Medical: Not on file    Non-medical: Not on file  Tobacco Use  . Smoking status: Never Smoker  . Smokeless tobacco: Never Used  Substance and Sexual Activity  . Alcohol use: No  . Drug use: No  . Sexual activity: Yes    Birth control/protection: Pill  Lifestyle  . Physical activity:    Days per week: Not on file    Minutes per session: Not on file  . Stress: Not on file  Relationships  . Social connections:    Talks on phone: Not on file    Gets together: Not on file    Attends religious service: Not on file    Active member of club or organization: Not on file    Attends meetings of clubs or organizations: Not on file    Relationship status: Not on file  . Intimate partner violence:    Fear of current or ex partner: Not  on file    Emotionally abused: Not on file    Physically abused: Not on file    Forced sexual activity: Not on file  Other Topics Concern  . Not on file  Social History Narrative  . Not on file    Allergies:  Allergies  Allergen Reactions  . Amoxicillin Itching    Has patient had a PCN reaction causing immediate rash, facial/tongue/throat swelling, SOB or lightheadedness with hypotension: No Has patient had a PCN reaction causing severe rash involving mucus membranes or skin necrosis: No Has patient had a PCN reaction that required hospitalization: No Has patient had a PCN reaction occurring within the last 10 years: No If all of the above answers are "NO", then may  proceed with Cephalosporin use.   Marland Kitchen Keflex [Cephalexin] Itching    Medications: Prior to Admission medications   Medication Sig Start Date End Date Taking? Authorizing Provider  acetaminophen (TYLENOL) 500 MG tablet Take 1,000 mg by mouth every 6 (six) hours as needed (for pain).   Yes [provider]  busPIRone (BUSPAR) 15 MG tablet Take 1 tablet (15 mg total) by mouth 2 (two) times daily. 08/19/18  Yes [name], MD  cetirizine (ZYRTEC) 10 MG tablet Take 10 mg by mouth every evening.    Yes [provider]  diphenhydrAMINE (BENADRYL) 25 MG tablet Take 25 mg by mouth every 6 (six) hours as needed.   Yes [provider]  esomeprazole (NEXIUM) 20 MG capsule Take 20 mg by mouth every evening.   Yes [provider]  hydrOXYzine (ATARAX/VISTARIL) 25 MG tablet Take 1 tablet (25 mg total) by mouth every 6 (six) hours as needed for anxiety. 05/17/18  Yes [name], MD  norethindrone (MICRONOR,CAMILA,ERRIN) 0.35 MG tablet Take 1 tablet (0.35 mg total) by mouth daily. 05/17/18  Yes [name], MD  nystatin cream (MYCOSTATIN) Apply 1 application topically 2 (two) times daily. 06/21/18  Yes [name], MD  Prenatal Vit-Fe Fumarate-FA (PRENATAL MULTIVITAMIN) TABS Take 1 tablet by mouth every evening.    Yes [provider]  sertraline (ZOLOFT) 100 MG tablet Take 2 tablets (200 mg total) by mouth daily. 07/19/18  Yes [name], MD    Physical Exam Vitals:  Vitals:   10/29/18 0904  BP: (!) 142/98  Pulse: (!) 117   No LMP recorded.  General: NAD HEENT: normocephalic, anicteric Pulmonary: No increased work of breathing Neurologic: Grossly intact Psychiatric: mood appropriate, affect full    GAD 7 : Generalized Anxiety Score 10/29/2018 08/19/2018 07/19/2018  Nervous, Anxious, on Edge 1 1 3   Control/stop worrying 0 1 1  Worry too much - different things 0 1 1  Trouble relaxing 0 0 1  Restless 0 0 1  Easily annoyed or  irritable 0 1 1  Afraid - awful might happen 0 1 2  Total GAD 7 Score 1 5 10   Anxiety Difficulty Not difficult at all Not difficult at all Somewhat difficult    Depression screen [facility] 2/9 10/29/2018 08/19/2018 07/19/2018  Decreased Interest 0 0 0  Down, Depressed, Hopeless 0 1 0  PHQ - 2 Score 0 1 0  Altered sleeping 0 0 0  Tired, decreased energy 0 0 0  Change in appetite 0 1 0  Feeling bad or failure about yourself  0 0 0  Trouble concentrating 0 0 0  Moving slowly or fidgety/restless 0 0 0  Suicidal thoughts 0 0 0  PHQ-9 Score 0 2 0    Depression  screen [facility] 2/9 10/29/2018 08/19/2018 07/19/2018  Decreased Interest 0 0 0  Down, Depressed, Hopeless 0 1 0  PHQ - 2 Score 0 1 0  Altered sleeping 0 0 0  Tired, decreased energy 0 0 0  Change in appetite 0 1 0  Feeling bad or failure about yourself  0 0 0  Trouble concentrating 0 0 0  Moving slowly or fidgety/restless 0 0 0  Suicidal thoughts 0 0 0  PHQ-9 Score 0 2 0     Assessment: 33 y.o. [id number] follow up depression and anxiety  Plan: Problem List Items Addressed This Visit    None    Visit Diagnoses    Depression with anxiety    -  Primary   Relevant Medications   busPIRone (BUSPAR) 15 MG tablet   sertraline (ZOLOFT) 100 MG tablet      1) Depression and anxiety - in full remission.  Continue Zoloft 200mg  daily and Buspar 15mg  bid.  Discussed if symptoms remain stable she does have option of titration down on dose over time.    2) Thyroid and B12 screen has not been obtained previously  3) A total of 15 minutes were spent in face-to-face contact with the patient during this encounter with over half of that time devoted to counseling and coordination of care.  4) Return in about 10 months (around 08/29/2019) for annual.    Vena AustriaAndreas Milton Streicher, MD, Merlinda FrederickFACOG [facility], [facility] 10/29/2018, 9:29 AM

## 2018-12-21 IMAGING — US US FETAL BPP W/O NONSTRESS
1 series · 14 of 15 positions shown · non-contrast
Comparison: none

CLINICAL DATA: Polyhydramnios.  Nonreactive nonstress test.

EXAM:
LIMITED OBSTETRIC ULTRASOUND AND BIOPHYSICAL PROFILE

[Series 1: us fetal bpp w/o nonstress · 0.28mm/px · 15 acquisitions, 14 frames shown]
[im 1/15]
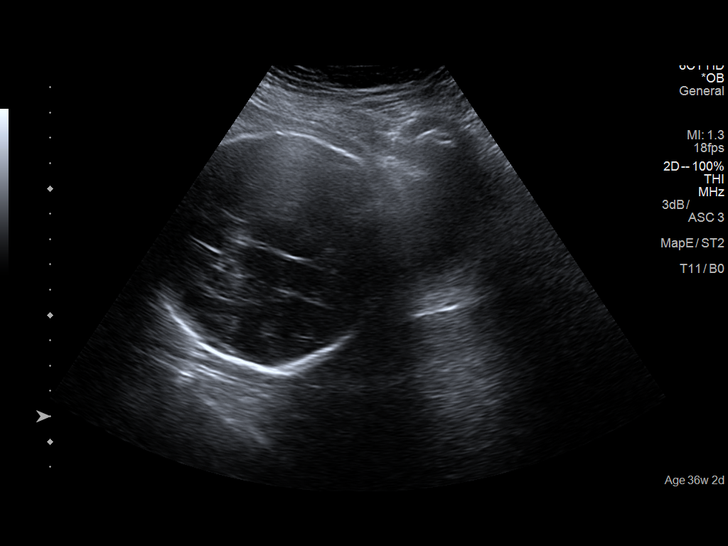
[im 2/15]
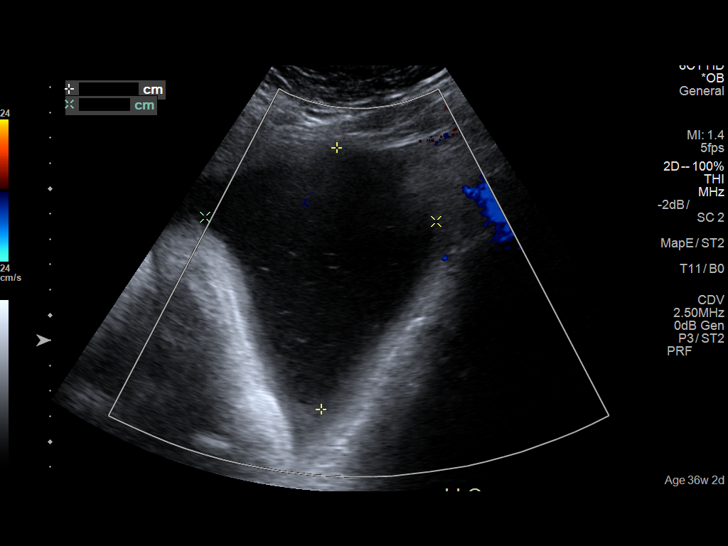
[im 3/15]
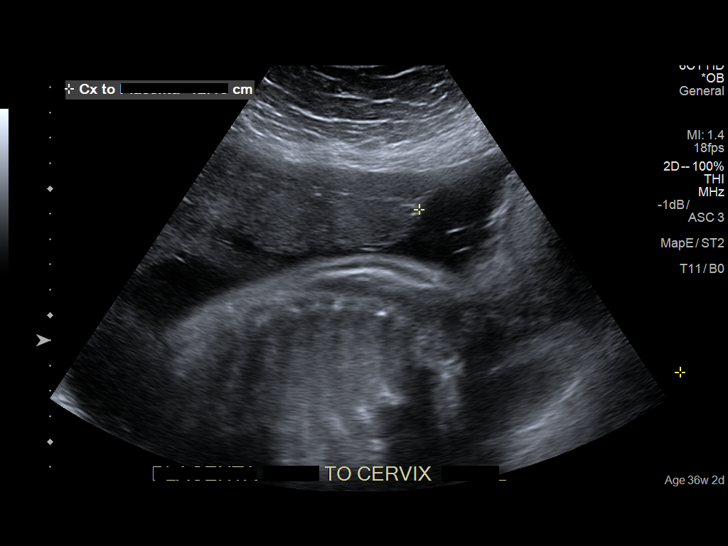
[im 4/15]
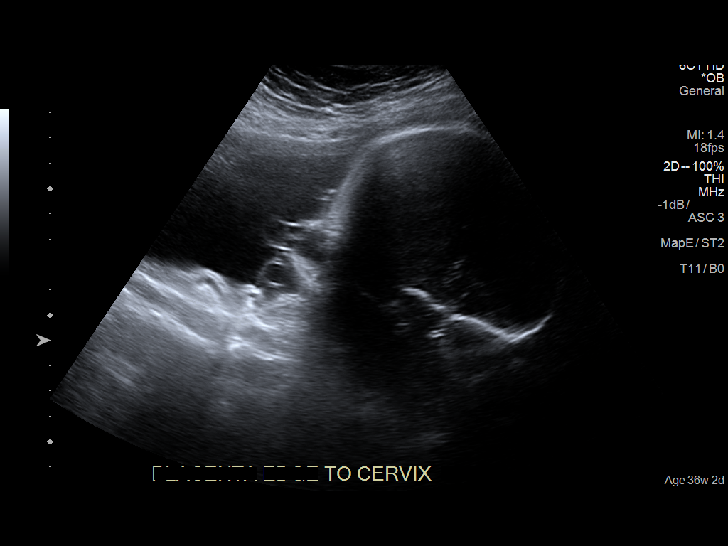
[im 5/15]
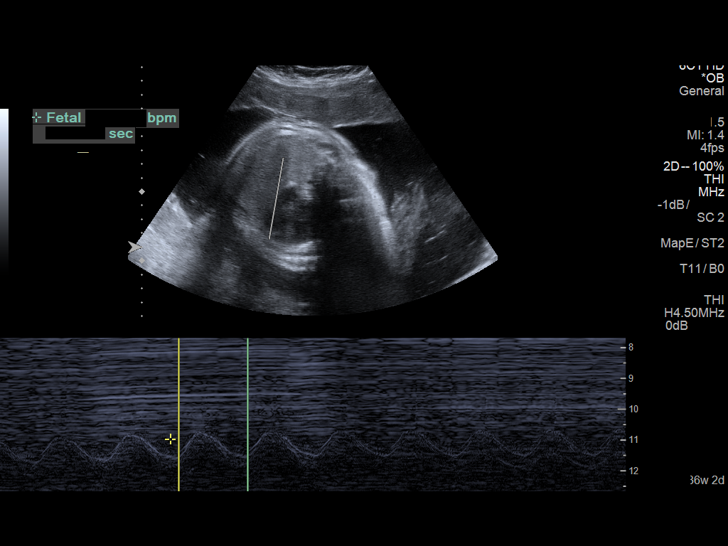
[im 6/15]
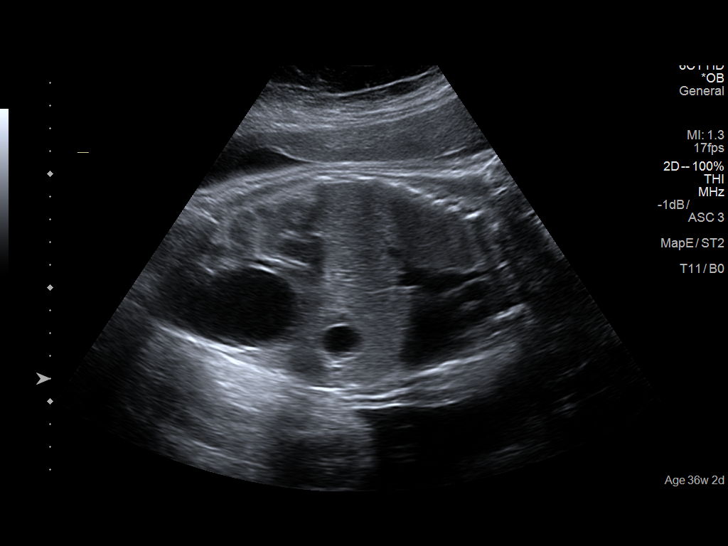
[im 7/15]
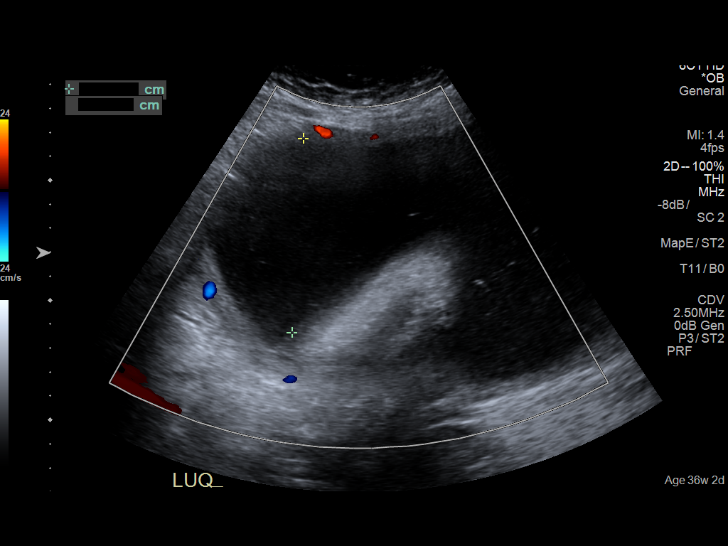
[im 9/15]
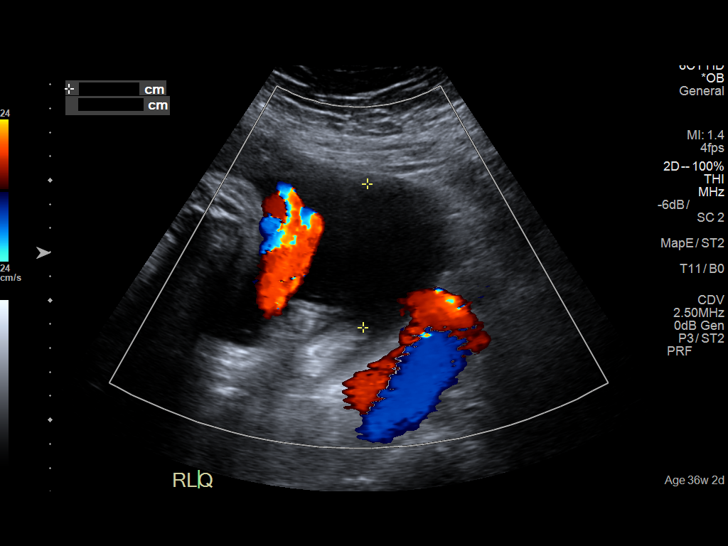
[im 10/15]
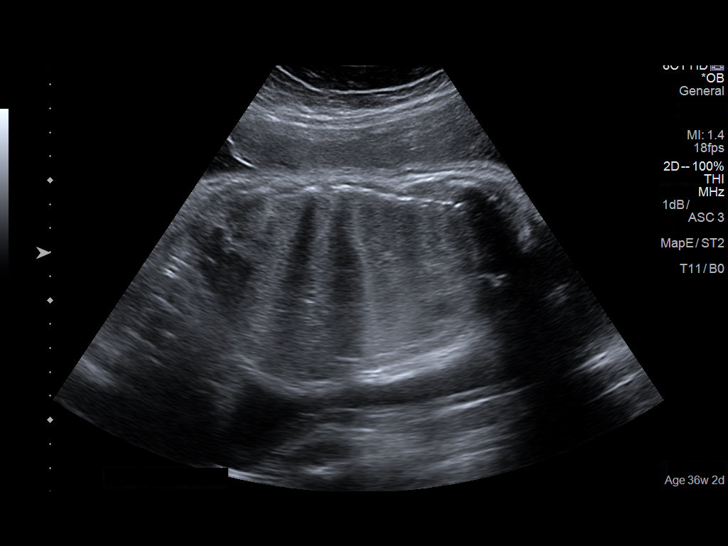
[im 11/15]
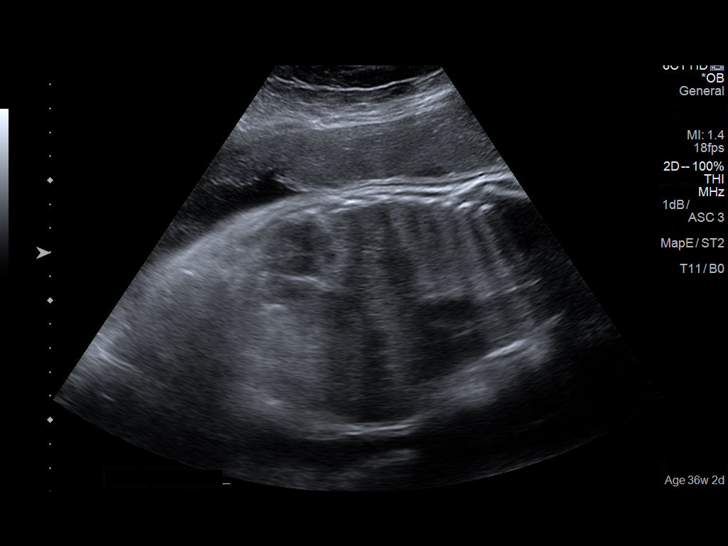
[im 12/15]
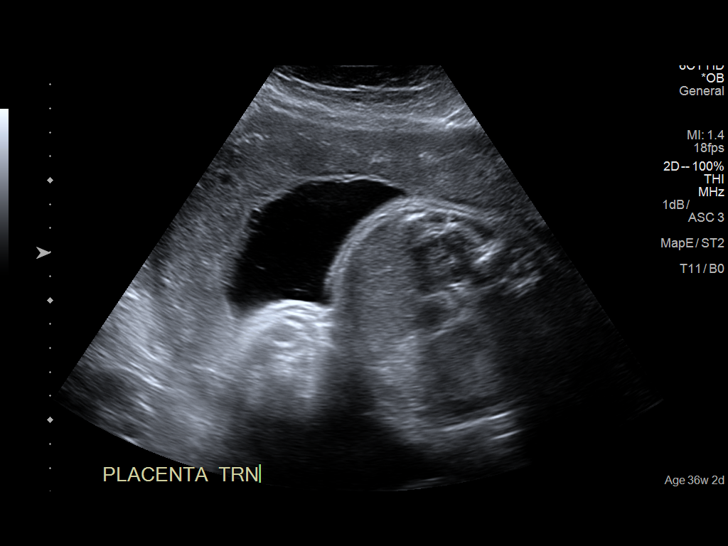
[im 13/15]
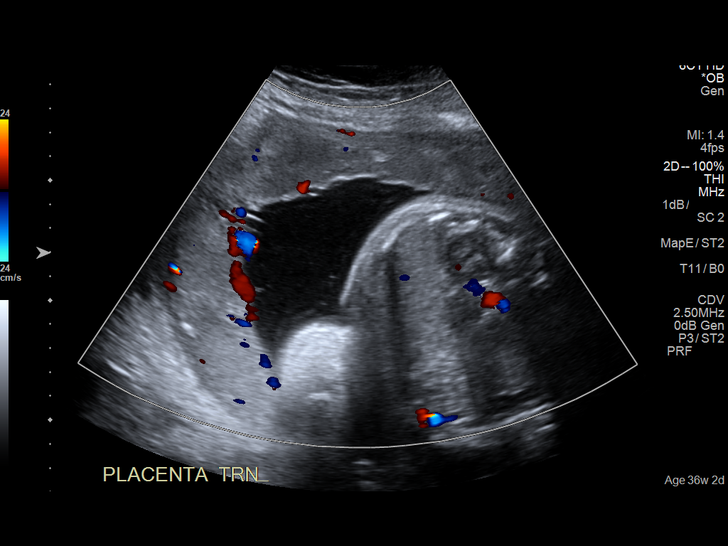
[im 14/15]
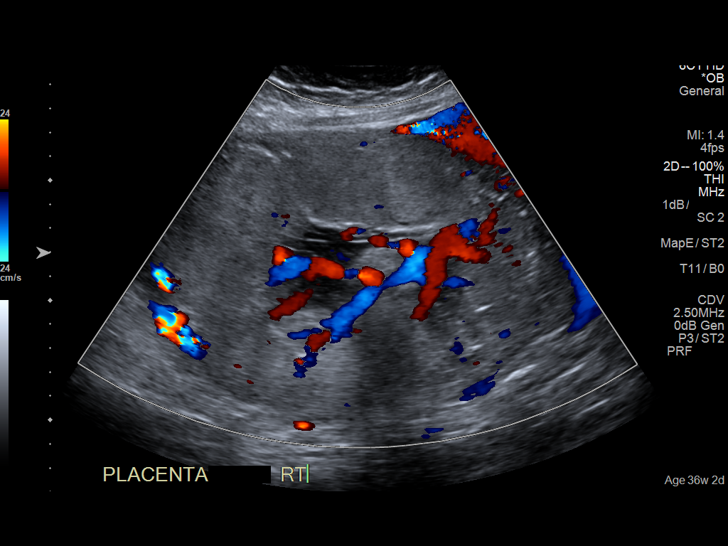
[im 15/15]
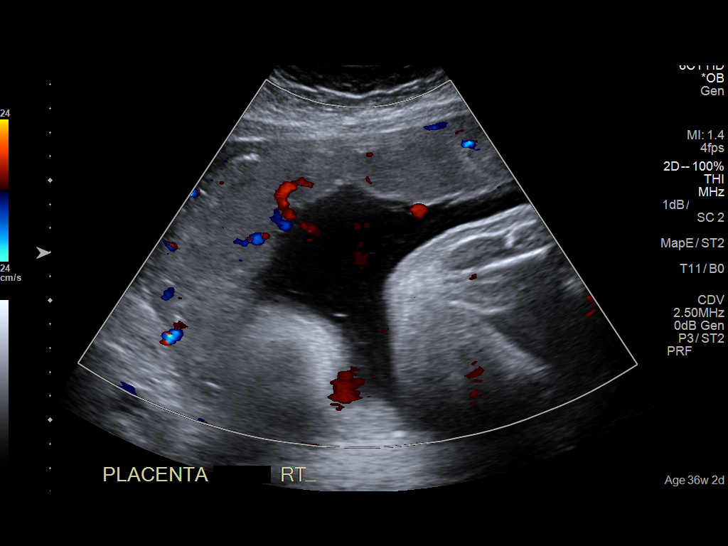

[14 of 15 positions shown; findings below may reference images not displayed]

FINDINGS: Number of Fetuses: 1

Heart Rate:  149 bpm

Movement: Yes

Presentation: Cephalic

Placental Location: Anterior and right lateral

Previa: No

Amniotic Fluid (Subjective):  Increased

AFI: 32.5 cm (95 %ile = 24.9 cm for 36 weeks)

Movement:  2  Time: 17 minutes

Breathing: 2

Tone:  2

Amniotic Fluid: 2

Total Score:  8
IMPRESSION: Single living IUP in cephalic presentation.

Polyhydramnios, with AFI of 32.5 cm.

Biophysical profile score is [DATE].

This exam is performed on an emergent basis and does not
comprehensively evaluate fetal size, dating, or anatomy; follow-up
complete OB US should be considered if further fetal assessment is
warranted.

## 2019-11-18 ENCOUNTER — Telehealth: Payer: Self-pay

## 2019-11-18 NOTE — Telephone Encounter (Signed)
Called and left voice mail for patient to call back to be schedule °

## 2019-11-18 NOTE — Telephone Encounter (Signed)
Spoke w/patient. Advised can schedule telehealth/virtual visit for med f/u and schedule her AE for June. Front desk unavailable at this time. Will have them contact her to schedule these apts.

## 2019-11-18 NOTE — Telephone Encounter (Signed)
Patient is on Sertraline and Buspar. She adjusts Buspar as needed and has only been taking 1 instead of two. She had two refills of Buspar but they expired 10/29/19. She has about 2 weeks left of current rx. Inquiring if AMS would either refill til she can get in for apt in June when school is over or schedule her for a virtual visit. WF#[phone]

## 2019-11-19 NOTE — Telephone Encounter (Signed)
Patient is schedule for 11/26/19 with AMS

## 2019-11-26 ENCOUNTER — Ambulatory Visit: Payer: Managed Care, Other (non HMO) | Admitting: Obstetrics and Gynecology

## 2019-11-26 ENCOUNTER — Other Ambulatory Visit: Payer: Self-pay

## 2019-12-03 ENCOUNTER — Ambulatory Visit (INDEPENDENT_AMBULATORY_CARE_PROVIDER_SITE_OTHER): Payer: Managed Care, Other (non HMO) | Admitting: Obstetrics and Gynecology

## 2019-12-03 ENCOUNTER — Other Ambulatory Visit: Payer: Self-pay

## 2019-12-03 ENCOUNTER — Encounter: Payer: Self-pay | Admitting: Obstetrics and Gynecology

## 2019-12-03 DIAGNOSIS — F329 Major depressive disorder, single episode, unspecified: Secondary | ICD-10-CM | POA: Diagnosis not present

## 2019-12-03 DIAGNOSIS — F419 Anxiety disorder, unspecified: Secondary | ICD-10-CM

## 2019-12-03 DIAGNOSIS — R300 Dysuria: Secondary | ICD-10-CM

## 2019-12-03 MED ORDER — NITROFURANTOIN MONOHYD MACRO 100 MG PO CAPS: 100.0000 mg | ORAL_CAPSULE | Freq: Two times a day (BID) | ORAL | 0 refills | Status: AC

## 2019-12-03 MED ORDER — BUSPIRONE HCL 15 MG PO TABS: 15.0000 mg | ORAL_TABLET | Freq: Every day | ORAL | 3 refills | Status: DC

## 2019-12-03 MED ORDER — SERTRALINE HCL 100 MG PO TABS: 100.0000 mg | ORAL_TABLET | Freq: Every day | ORAL | 3 refills | Status: DC

## 2019-12-03 NOTE — Progress Notes (Signed)
I connected with [name]  on 12/09/19 at 11:10 AM EDT by telephone and verified that I am speaking with the correct person using two identifiers.   I discussed the limitations, risks, security and privacy concerns of performing an evaluation and management service by telephone and the availability of in person appointments. I also discussed with the patient that there may be a patient responsible charge related to this service. The patient expressed understanding and agreed to proceed.  The patient was at home I spoke with the patient from my workstation phone The names of people involved in this encounter were: [name] , and [name]   Obstetrics & Gynecology Office Visit   Chief Complaint:  Chief Complaint  Patient presents with  . Follow-up    Anxiety depression    History of Present Illness: The patient is a 34 y.o. female presenting follow up for symptoms of anxiety and depression.  The patient is currently taking Zoloft 100mg  po daily and buspar 15mg  po daily.   for the management of her symptoms.  She has not had any recent situational stressors.  She reports continued good control on her current regiment and wishes to continue.    The patient does have a pre-existing history of depression and anxiety.  She  does not a prior history of suicide attempts.    Review of Systems: Review of Systems  Constitutional: Negative.   Neurological: Negative for headaches.  Psychiatric/Behavioral: Negative.      Past Medical History:  Past Medical History:  Diagnosis Date  . Anxiety   . History of hiatal hernia   . Hypertension    with pregnancy. reads normal at home no meds  . 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 Palpitations   . Scoliosis   . Trouble in sleeping     Past Surgical History:  Past Surgical History:  Procedure Laterality Date  . CESAREAN SECTION N/A 03/04/2013   Procedure: CESAREAN SECTION;  Surgeon: , MD;  Location: [facility];   Service: Obstetrics;  Laterality: N/A;  . CESAREAN SECTION N/A 05/09/2018   Procedure: CESAREAN SECTION;  Surgeon: Bing Plume, MD;  Location: [facility] ORS;  Service: Obstetrics;  Laterality: N/A;    Gynecologic History: Patient's last menstrual period was 11/19/2019 (exact date).  Obstetric History: [name]  Family History:  Family History  Problem Relation Age of Onset  . Diabetes Mother   . Hypertension Mother   . Kidney disease Mother   . Hypertension Father     Social History:  Social History   Socioeconomic History  . Marital status: Married    Spouse name: Not on file  . Number of children: 1  . Years of education: 16  . Highest education level: Not on file  Occupational History  . Occupation: self employed  Tobacco Use  . Smoking status: Never Smoker  . Smokeless tobacco: Never Used  Substance and Sexual Activity  . Alcohol use: No  . Drug use: No  . Sexual activity: Yes    Birth control/protection: Pill  Other Topics Concern  . Not on file  Social History Narrative  . Not on file   Social Determinants of Health   Financial Resource Strain:   . Difficulty of Paying Living Expenses:   Food Insecurity:   . Worried About L2G4010 in the Last Year:   . 15 of Food in the Last Year:   Transportation Needs:   . Lack of  Transportation (Medical):   Marland Kitchen Lack of Transportation (Non-Medical):   Physical Activity:   . Days of Exercise per Week:   . Minutes of Exercise per Session:   Stress:   . Feeling of Stress :   Social Connections:   . Frequency of Communication with Friends and Family:   . Frequency of Social Gatherings with Friends and Family:   . Attends Religious Services:   . Active Member of Clubs or Organizations:   . Attends Banker Meetings:   Marland Kitchen Marital Status:   Intimate Partner Violence:   . Fear of Current or Ex-Partner:   . Emotionally Abused:   Marland Kitchen Physically Abused:   . Sexually Abused:     Allergies:    Allergies  Allergen Reactions  . Amoxicillin Itching    Has patient had a PCN reaction causing immediate rash, facial/tongue/throat swelling, SOB or lightheadedness with hypotension: No Has patient had a PCN reaction causing severe rash involving mucus membranes or skin necrosis: No Has patient had a PCN reaction that required hospitalization: No Has patient had a PCN reaction occurring within the last 10 years: No If all of the above answers are "NO", then may proceed with Cephalosporin use.   Marland Kitchen Keflex [Cephalexin] Itching    Medications: Prior to Admission medications   Medication Sig Start Date End Date Taking? Authorizing Provider  acetaminophen (TYLENOL) 500 MG tablet Take 1,000 mg by mouth every 6 (six) hours as needed (for pain).   Yes [provider]  busPIRone (BUSPAR) 15 MG tablet Take 1 tablet (15 mg total) by mouth 2 (two) times daily. 10/29/18  Yes Jenna Austria, MD  cetirizine (ZYRTEC) 10 MG tablet Take 10 mg by mouth every evening.    Yes [provider]  diphenhydrAMINE (BENADRYL) 25 MG tablet Take 25 mg by mouth every 6 (six) hours as needed.   Yes [provider]  esomeprazole (NEXIUM) 20 MG capsule Take 20 mg by mouth every evening.   Yes [provider]  hydrOXYzine (ATARAX/VISTARIL) 25 MG tablet Take 1 tablet (25 mg total) by mouth every 6 (six) hours as needed for anxiety. 05/17/18  Yes Jenna Austria, MD  norethindrone (MICRONOR,CAMILA,ERRIN) 0.35 MG tablet Take 1 tablet (0.35 mg total) by mouth daily. 05/17/18  Yes Jenna Austria, MD  nystatin cream (MYCOSTATIN) Apply 1 application topically 2 (two) times daily. 06/21/18  Yes Jenna Austria, MD  Prenatal Vit-Fe Fumarate-FA (PRENATAL MULTIVITAMIN) TABS Take 1 tablet by mouth every evening.    Yes [provider]  sertraline (ZOLOFT) 100 MG tablet Take 2 tablets (200 mg total) by mouth daily. 10/29/18  Yes Jenna Austria, MD    Physical Exam Vitals: There were  no vitals filed for this visit. Patient's last menstrual period was 11/19/2019 (exact date).    No physical exam as this was a remote telephone visit to promote social distancing during the current COVID-19 Pandemic   GAD 7 : Generalized Anxiety Score 12/03/2019 10/29/2018 08/19/2018 07/19/2018  Nervous, Anxious, on Edge 1 1 1 3   Control/stop worrying 0 0 1 1  Worry too much - different things 0 0 1 1  Trouble relaxing 1 0 0 1  Restless 0 0 0 1  Easily annoyed or irritable 0 0 1 1  Afraid - awful might happen 0 0 1 2  Total GAD 7 Score 2 1 5 10   Anxiety Difficulty Not difficult at all Not difficult at all Not difficult at all Somewhat difficult    Depression  screen [facility] 2/9 12/03/2019 10/29/2018 08/19/2018  Decreased Interest 0 0 0  Down, Depressed, Hopeless 1 0 1  PHQ - 2 Score 1 0 1  Altered sleeping 0 0 0  Tired, decreased energy 1 0 0  Change in appetite 0 0 1  Feeling bad or failure about yourself  0 0 0  Trouble concentrating 0 0 0  Moving slowly or fidgety/restless 0 0 0  Suicidal thoughts 0 0 0  PHQ-9 Score 2 0 2    Depression screen [facility] 2/9 12/03/2019 10/29/2018 08/19/2018 07/19/2018  Decreased Interest 0 0 0 0  Down, Depressed, Hopeless 1 0 1 0  PHQ - 2 Score 1 0 1 0  Altered sleeping 0 0 0 0  Tired, decreased energy 1 0 0 0  Change in appetite 0 0 1 0  Feeling bad or failure about yourself  0 0 0 0  Trouble concentrating 0 0 0 0  Moving slowly or fidgety/restless 0 0 0 0  Suicidal thoughts 0 0 0 0  PHQ-9 Score 2 0 2 0     Assessment: 34 y.o. [id number] follow up anxiety and depression  Plan: Problem List Items Addressed This Visit    None    Visit Diagnoses    Dysuria    -  Primary   Anxiety and depression       Relevant Medications   sertraline (ZOLOFT) 100 MG tablet   busPIRone (BUSPAR) 15 MG tablet      1) Zoloft has titrated down to 100mg  a day, only taking buspar once daily so will continue at that dose for now seeing as she is doing well  2) UTI  symptoms - will treat empirically with macrobid.  Did self treat for a candida infection last week monistat 7  3) Telephone time 8:53  4) Return in about 1 year (around 12/02/2020) for annual.    Malachy Mood, MD, [place], [facility] 12/03/2019, 11:34 AM

## 2021-07-13 ENCOUNTER — Telehealth: Payer: Self-pay

## 2021-07-13 NOTE — Telephone Encounter (Signed)
Pt calling; is having a lot of anxiety; when last talked c AMS on phone things were going really well and she didn't ask for refills; has tapered off rx and then stopped it.  Then a lot of life happened and she isn't handling things well.  Her schedule is super crazy.  Could she have enough to tie her over to Christmas break; she promises to schedule her annual during Christmas break.  [phone]  Pt is willing to do a phone appt if necessary; the anxiety is also causing stomach issues - acid reflux and gassy; Buspar is what she weaned off of; needs both buspar and zoloft.  Pharm is correct in chart.

## 2021-07-14 ENCOUNTER — Other Ambulatory Visit: Payer: Self-pay | Admitting: Obstetrics and Gynecology

## 2021-07-14 MED ORDER — SERTRALINE HCL 100 MG PO TABS: 100.0000 mg | ORAL_TABLET | Freq: Every day | ORAL | 0 refills | Status: DC

## 2021-07-14 NOTE — Telephone Encounter (Signed)
Pt calling; hasn't heard back from AMS; wants to make sure the msg didn't get lost in the shuffle.  [phone]  Assured pt her msg has been sent to AMS; adv he is in the OR and On Call today.

## 2021-07-14 NOTE — Telephone Encounter (Signed)
Left detailed msg 'your refill has been called in'.

## 2021-07-14 NOTE — Telephone Encounter (Signed)
3 month refill called in

## 2021-07-19 ENCOUNTER — Telehealth: Payer: Self-pay

## 2021-07-19 NOTE — Telephone Encounter (Signed)
Pt calling; has been on sertraline since Thurs; is having sweating at night, can't sleep at night, nausea, panic attacks have increased; wants AMS's opinion on that.  Also, has been having burning off and on after voiding and discomfort in back and groin area voiding.  [phone]

## 2021-07-21 ENCOUNTER — Encounter: Payer: Self-pay | Admitting: Emergency Medicine

## 2021-07-21 ENCOUNTER — Emergency Department
Admission: EM | Admit: 2021-07-21 | Discharge: 2021-07-21 | Disposition: A | Discharge status: Home / Self Care | Payer: Managed Care, Other (non HMO) | Attending: Emergency Medicine | Admitting: Emergency Medicine

## 2021-07-21 ENCOUNTER — Ambulatory Visit (INDEPENDENT_AMBULATORY_CARE_PROVIDER_SITE_OTHER): Payer: Managed Care, Other (non HMO) | Admitting: Obstetrics and Gynecology

## 2021-07-21 ENCOUNTER — Encounter: Payer: Self-pay | Admitting: Obstetrics and Gynecology

## 2021-07-21 ENCOUNTER — Other Ambulatory Visit: Payer: Self-pay

## 2021-07-21 VITALS — BP 168/116 | Ht 63.0 in | Wt 207.0 lb

## 2021-07-21 DIAGNOSIS — N390 Urinary tract infection, site not specified: Secondary | ICD-10-CM | POA: Diagnosis not present

## 2021-07-21 DIAGNOSIS — F419 Anxiety disorder, unspecified: Secondary | ICD-10-CM | POA: Insufficient documentation

## 2021-07-21 DIAGNOSIS — F32A Depression, unspecified: Secondary | ICD-10-CM | POA: Diagnosis not present

## 2021-07-21 DIAGNOSIS — I10 Essential (primary) hypertension: Secondary | ICD-10-CM | POA: Insufficient documentation

## 2021-07-21 DIAGNOSIS — Z79899 Other long term (current) drug therapy: Secondary | ICD-10-CM | POA: Insufficient documentation

## 2021-07-21 DIAGNOSIS — F5104 Psychophysiologic insomnia: Secondary | ICD-10-CM | POA: Insufficient documentation

## 2021-07-21 LAB — CBC WITH DIFFERENTIAL/PLATELET
Abs Immature Granulocytes: 0.02 10*3/uL (ref 0.00–0.07)
Basophils Absolute: 0 10*3/uL (ref 0.0–0.1)
Basophils Relative: 0 %
Eosinophils Absolute: 0 10*3/uL (ref 0.0–0.5)
Eosinophils Relative: 0 %
HCT: 36.1 % (ref 36.0–46.0)
Hemoglobin: 11.4 g/dL — ABNORMAL LOW (ref 12.0–15.0)
Immature Granulocytes: 0 %
Lymphocytes Relative: 18 %
Lymphs Abs: 1.5 10*3/uL (ref 0.7–4.0)
MCH: 24.1 pg — ABNORMAL LOW (ref 26.0–34.0)
MCHC: 31.6 g/dL (ref 30.0–36.0)
MCV: 76.2 fL — ABNORMAL LOW (ref 80.0–100.0)
Monocytes Absolute: 0.7 10*3/uL (ref 0.1–1.0)
Monocytes Relative: 8 %
Neutro Abs: 6.2 10*3/uL (ref 1.7–7.7)
Neutrophils Relative %: 74 %
Platelets: 397 10*3/uL (ref 150–400)
RBC: 4.74 MIL/uL (ref 3.87–5.11)
RDW: 15.6 % — ABNORMAL HIGH (ref 11.5–15.5)
WBC: 8.4 10*3/uL (ref 4.0–10.5)
nRBC: 0 % (ref 0.0–0.2)

## 2021-07-21 LAB — URINALYSIS, ROUTINE W REFLEX MICROSCOPIC
Bilirubin Urine: NEGATIVE
Glucose, UA: NEGATIVE mg/dL
Ketones, ur: 5 mg/dL — AB
Nitrite: NEGATIVE
Protein, ur: NEGATIVE mg/dL
Specific Gravity, Urine: 1.011 (ref 1.005–1.030)
pH: 6 (ref 5.0–8.0)

## 2021-07-21 LAB — COMPREHENSIVE METABOLIC PANEL
ALT: 19 U/L (ref 0–44)
AST: 23 U/L (ref 15–41)
Albumin: 4.5 g/dL (ref 3.5–5.0)
Alkaline Phosphatase: 86 U/L (ref 38–126)
Anion gap: 9 (ref 5–15)
BUN: 9 mg/dL (ref 6–20)
CO2: 27 mmol/L (ref 22–32)
Calcium: 9.1 mg/dL (ref 8.9–10.3)
Chloride: 101 mmol/L (ref 98–111)
Creatinine, Ser: 0.75 mg/dL (ref 0.44–1.00)
GFR, Estimated: >60 mL/min (ref >60)
Glucose, Bld: 117 mg/dL — ABNORMAL HIGH (ref 70–99)
Potassium: 2.9 mmol/L — ABNORMAL LOW (ref 3.5–5.1)
Sodium: 137 mmol/L (ref 135–145)
Total Bilirubin: 0.6 mg/dL (ref 0.3–1.2)
Total Protein: 8.6 g/dL — ABNORMAL HIGH (ref 6.5–8.1)

## 2021-07-21 LAB — LIPASE, BLOOD: Lipase: 34 U/L (ref 11–51)

## 2021-07-21 LAB — POC URINE PREG, ED: Preg Test, Ur: NEGATIVE

## 2021-07-21 MED ORDER — FLUOXETINE HCL 20 MG PO CAPS: 20.0000 mg | ORAL_CAPSULE | Freq: Every day | ORAL | 3 refills | Status: DC

## 2021-07-21 MED ORDER — HYDROXYZINE HCL 25 MG PO TABS: 25.0000 mg | ORAL_TABLET | Freq: Four times a day (QID) | ORAL | 2 refills | Status: DC | PRN

## 2021-07-21 MED ORDER — SULFAMETHOXAZOLE-TRIMETHOPRIM 800-160 MG PO TABS: 1.0000 | ORAL_TABLET | Freq: Two times a day (BID) | ORAL | 0 refills | Status: AC

## 2021-07-21 MED ORDER — TRAZODONE HCL 50 MG PO TABS: 50.0000 mg | ORAL_TABLET | Freq: Every evening | ORAL | 2 refills | Status: DC | PRN

## 2021-07-21 NOTE — ED Provider Notes (Signed)
[facility] Emergency Department Provider Note   ____________________________________________   Event Date/Time   First MD Initiated Contact with Patient 07/21/21 [phone]     (approximate)  I have reviewed the triage vital signs and the nursing notes.   HISTORY  Chief Complaint Insomnia, Anxiety, and Dysuria    HPI [name] is a 35 y.o. female who presents for dysuria  LOCATION: Suprapubic region DURATION: 1 week prior to arrival TIMING: Slightly improved but still present after treatment SEVERITY: Mild QUALITY: Dull pain CONTEXT: Patient states that she has been having suprapubic pain that is now radiating up her back and was diagnosed with a UTI approximately 3 days prior to arrival and has been on Bactrim with minimal improvement MODIFYING FACTORS: Denies any exacerbating or relieving factors ASSOCIATED SYMPTOMS: Anxiety, insomnia, back pain   Per medical record review, patient has history of anxiety          Past Medical History:  Diagnosis Date   Anxiety    History of hiatal hernia    Hypertension    with pregnancy. 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    Palpitations    Scoliosis    Trouble in sleeping     Patient Active Problem List   Diagnosis Date Noted   Uterine scar from previous cesarean delivery affecting pregnancy 05/09/2018   Indication for care in labor and delivery, antepartum 05/09/2018   Pregnancy 04/24/2018   Hematuria 04/15/2018   Polyhydramnios affecting pregnancy in third trimester 03/20/2018   Anemia in pregnancy, third trimester 03/13/2018   Chronic hypertension during pregnancy, antepartum 11/29/2017   Anxiety 11/29/2017   Hiatal hernia 10/04/2017   Mitral valve disorder 10/04/2017   Palpitations 10/04/2017   Scoliosis deformity of spine 10/04/2017   Supervision of high risk pregnancy, antepartum 09/25/2017   History of gestational hypertension 09/25/2017   History of  cesarean delivery, currently pregnant 09/25/2017    Past Surgical History:  Procedure Laterality Date   CESAREAN SECTION N/A 03/04/2013   Procedure: CESAREAN SECTION;  Surgeon: Bing Plume, MD;  Location: [facility];  Service: Obstetrics;  Laterality: N/A;   CESAREAN SECTION N/A 05/09/2018   Procedure: CESAREAN SECTION;  Surgeon: [name], MD;  Location: [facility] ORS;  Service: Obstetrics;  Laterality: N/A;    Prior to Admission medications   Medication Sig Start Date End Date Taking? Authorizing Provider  sulfamethoxazole-trimethoprim (BACTRIM DS) 800-160 MG tablet Take 1 tablet by mouth 2 (two) times daily for 5 days. 07/21/21 07/26/21 Yes Merwyn Katos, MD  acetaminophen (TYLENOL) 500 MG tablet Take 1,000 mg by mouth every 6 (six) hours as needed (for pain).    [provider]  busPIRone (BUSPAR) 15 MG tablet Take 1 tablet (15 mg total) by mouth daily. 12/03/19   [name], MD  cetirizine (ZYRTEC) 10 MG tablet Take 10 mg by mouth every evening.     [provider]  diphenhydrAMINE (BENADRYL) 25 MG tablet Take 25 mg by mouth every 6 (six) hours as needed.    [provider]  esomeprazole (NEXIUM) 20 MG capsule Take 20 mg by mouth every evening.    [provider]  hydrOXYzine (ATARAX/VISTARIL) 25 MG tablet Take 1 tablet (25 mg total) by mouth every 6 (six) hours as needed for anxiety. 05/17/18   [name], MD  norethindrone (MICRONOR,CAMILA,ERRIN) 0.35 MG tablet Take 1 tablet (0.35 mg total) by mouth daily. 05/17/18   [name], MD  nystatin  cream (MYCOSTATIN) Apply 1 application topically 2 (two) times daily. 06/21/18   [name], MD  Prenatal Vit-Fe Fumarate-FA (PRENATAL MULTIVITAMIN) TABS Take 1 tablet by mouth every evening.     [provider]  sertraline (ZOLOFT) 100 MG tablet Take 1 tablet (100 mg total) by mouth daily. 07/14/21   [name], MD    Allergies Amoxicillin and Keflex  [cephalexin]  Family History  Problem Relation Age of Onset   Diabetes Mother    Hypertension Mother    Kidney disease Mother    Hypertension Father     Social History Social History   Tobacco Use   Smoking status: Never   Smokeless tobacco: Never  Vaping Use   Vaping Use: Never used  Substance Use Topics   Alcohol use: No   Drug use: No    Review of Systems Constitutional: No fever/chills Eyes: No visual changes. ENT: No sore throat. Cardiovascular: Denies chest pain. Respiratory: Denies shortness of breath. Gastrointestinal: Endorses suprapubic abdominal pain.  No nausea, no vomiting.  No diarrhea. Genitourinary: Negative for dysuria. Musculoskeletal: Negative for acute arthralgias Skin: Negative for rash. Neurological: Negative for headaches, weakness/numbness/paresthesias in any extremity Psychiatric: Positive for anxiety and insomnia.  Negative for suicidal ideation/homicidal ideation   ____________________________________________   PHYSICAL EXAM:  VITAL SIGNS: ED Triage Vitals  Enc Vitals Group     BP 07/21/21 0505 (!) 159/98     Pulse Rate 07/21/21 0505 (!) 101     Resp 07/21/21 0505 20     Temp 07/21/21 0505 97.7 F (36.5 C)     Temp Source 07/21/21 0505 Oral     SpO2 07/21/21 0505 100 %     Weight 07/21/21 0533 200 lb (90.7 kg)     Height 07/21/21 0533 5\' 3"  (1.6 m)     Head Circumference --      Peak Flow --      Pain Score 07/21/21 0532 5     Pain Loc --      Pain Edu? --      Excl. in GC? --    Constitutional: Alert and oriented. Well appearing and in no acute distress. Eyes: Conjunctivae are normal. PERRL. Head: Atraumatic. Nose: No congestion/rhinnorhea. Mouth/Throat: Mucous membranes are moist. Neck: No stridor Cardiovascular: Grossly normal heart sounds.  Good peripheral circulation. Respiratory: Normal respiratory effort.  No retractions. Gastrointestinal: Soft and nontender. No distention. Musculoskeletal: No obvious  deformities Neurologic:  Normal speech and language. No gross focal neurologic deficits are appreciated. Skin:  Skin is warm and dry. No rash noted. Psychiatric: Mood and affect are normal. Speech and behavior are normal.  ____________________________________________   LABS (all labs ordered are listed, but only abnormal results are displayed)  Labs Reviewed  CBC WITH DIFFERENTIAL/PLATELET - Abnormal; Notable for the following components:      Result Value   Hemoglobin 11.4 (*)    MCV 76.2 (*)    MCH 24.1 (*)    RDW 15.6 (*)    All other components within normal limits  COMPREHENSIVE METABOLIC PANEL - Abnormal; Notable for the following components:   Potassium 2.9 (*)    Glucose, Bld 117 (*)    Total Protein 8.6 (*)    All other components within normal limits  URINALYSIS, ROUTINE W REFLEX MICROSCOPIC - Abnormal; Notable for the following components:   Color, Urine YELLOW (*)    APPearance HAZY (*)    Hgb urine dipstick SMALL (*)    Ketones, ur 5 (*)  Leukocytes,Ua TRACE (*)    Bacteria, UA FEW (*)    All other components within normal limits  LIPASE, BLOOD  POC URINE PREG, ED    PROCEDURES  Procedure(s) performed (including Critical Care):  .1-3 Lead EKG Interpretation Performed by: Naaman Plummer, MD Authorized by: Naaman Plummer, MD     Interpretation: normal     ECG rate:  93   ECG rate assessment: normal     Rhythm: sinus rhythm     Ectopy: none     Conduction: normal     ____________________________________________   INITIAL IMPRESSION / ASSESSMENT AND PLAN / ED COURSE  As part of my medical decision making, I reviewed the following data within the electronic medical record, if available:  Nursing notes reviewed and incorporated, Labs reviewed, EKG interpreted, Old chart reviewed, Radiograph reviewed and Notes from prior ED visits reviewed and incorporated      This patient presents with symptoms consistent with acute anxiety reaction / panic  attack. Low suspicion for acute cardiopulmonary process including ACS, PE, or thoracic aortic dissection. Denies any ingestions or any other medical complaints. No evidence of alcohol withdrawal symptoms. Presentation not consistent with overt toxidrome, ingestion given history & physical. Presentation not consistent with organic or medical emergency at this time. No acute indication for psychiatric consultation (without SI/HI, AH/VH). Cautious return precautions discussed with full understanding. UA also showing persistent urinary tract infection Plan: Rx Bactrim, Psych follow up PRN Dispo: Discharge      ____________________________________________   FINAL CLINICAL IMPRESSION(S) / ED DIAGNOSES  Final diagnoses:  Lower urinary tract infectious disease  Anxiety  Psychophysiological insomnia     ED Discharge Orders          Ordered    sulfamethoxazole-trimethoprim (BACTRIM DS) 800-160 MG tablet  2 times daily        07/21/21 [id number]             Note:  This document was prepared using Dragon voice recognition software and may include unintentional dictation errors.    Naaman Plummer, MD 07/21/21 [id number]

## 2021-07-21 NOTE — Progress Notes (Signed)
Obstetrics & Gynecology Office Visit   Chief Complaint:  Chief Complaint  Patient presents with   Anxiety    RM 3    History of Present Illness: The patient is a 35 y.o. female presenting follow up for symptoms of anxiety and depression.  The patient is currently taking Zoloft 100mg  for the management of her symptoms.  She has not had any recent situational stressors. She discontinued Zoloft sometime in August but noted return of symptoms at which point she was restarted on Zoloft.  However she has not noted any improvements in symptoms since starting and reports insomnia, panic attacks, nausea, and sleep disturbances.   She reports symptoms of anhedonia, day time somnolence, insomnia, irritability, social anxiety, agorophobia, feelings of guilt, and feelings of worthlessness.  She denies risk taking behavior, agorophobia, suicidal ideation, homicidal ideation, auditory hallucinations, and visual hallucinations. Symptoms have worsened since last visit.     The patient does have a pre-existing history of depression and anxiety.  She  does not a prior history of suicide attempts.    Review of Systems: Review of Systems  Constitutional: Negative.   Gastrointestinal:  Positive for nausea. Negative for abdominal pain, blood in stool, constipation, diarrhea, heartburn, melena and vomiting.  Neurological:  Negative for headaches.  Psychiatric/Behavioral:  Positive for depression. Negative for hallucinations, memory loss, substance abuse and suicidal ideas. The patient is nervous/anxious and has insomnia.     Past Medical History:  Past Medical History:  Diagnosis Date   Anxiety    History of hiatal hernia    Hypertension    with pregnancy. 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    Palpitations    Scoliosis    Trouble in sleeping     Past Surgical History:  Past Surgical History:  Procedure Laterality Date   CESAREAN SECTION N/A 03/04/2013   Procedure:  CESAREAN SECTION;  Surgeon: 03/06/2013, MD;  Location: [facility];  Service: Obstetrics;  Laterality: N/A;   CESAREAN SECTION N/A 05/09/2018   Procedure: CESAREAN SECTION;  Surgeon: 05/11/2018, MD;  Location: [facility];  Service: Obstetrics;  Laterality: N/A;    Gynecologic History: Patient's last menstrual period was 07/14/2021 (exact date).  Obstetric History: 07/16/2021  Family History:  Family History  Problem Relation Age of Onset   Diabetes Mother    Hypertension Mother    Kidney disease Mother    Hypertension Father     Social History:  Social History   Socioeconomic History   Marital status: Married    Spouse name: Not on file   Number of children: 1   Years of education: 18   Highest education level: Not on file  Occupational History   Occupation: self employed  Tobacco Use   Smoking status: Never   Smokeless tobacco: Never  Vaping Use   Vaping Use: Never used  Substance and Sexual Activity   Alcohol use: No   Drug use: No   Sexual activity: Yes    Birth control/protection: Pill  Other Topics Concern   Not on file  Social History Narrative   Not on file   Social Determinants of Health   Financial Resource Strain: Not on file  Food Insecurity: Not on file  Transportation Needs: Not on file  Physical Activity: Not on file  Stress: Not on file  Social Connections: Not on file  Intimate Partner Violence: Not on file    Allergies:  Allergies  Allergen  Reactions   Amoxicillin Itching    Has patient had a PCN reaction causing immediate rash, facial/tongue/throat swelling, SOB or lightheadedness with hypotension: No Has patient had a PCN reaction causing severe rash involving mucus membranes or skin necrosis: No Has patient had a PCN reaction that required hospitalization: No Has patient had a PCN reaction occurring within the last 10 years: No If all of the above answers are "NO", then may proceed with Cephalosporin use.    Keflex [Cephalexin]  Itching    Medications: Prior to Admission medications   Medication Sig Start Date End Date Taking? Authorizing Provider  acetaminophen (TYLENOL) 500 MG tablet Take 1,000 mg by mouth every 6 (six) hours as needed (for pain).    [provider]  busPIRone (BUSPAR) 15 MG tablet Take 1 tablet (15 mg total) by mouth daily. Patient not taking: Reported on 07/21/2021 12/03/19   [name], MD  cetirizine (ZYRTEC) 10 MG tablet Take 10 mg by mouth every evening.     [provider]  diphenhydrAMINE (BENADRYL) 25 MG tablet Take 25 mg by mouth every 6 (six) hours as needed.    [provider]  esomeprazole (NEXIUM) 20 MG capsule Take 20 mg by mouth every evening.    [provider]  hydrOXYzine (ATARAX/VISTARIL) 25 MG tablet Take 1 tablet (25 mg total) by mouth every 6 (six) hours as needed for anxiety. 05/17/18   [name], MD  norethindrone (MICRONOR,CAMILA,ERRIN) 0.35 MG tablet Take 1 tablet (0.35 mg total) by mouth daily. 05/17/18   [name], MD  nystatin cream (MYCOSTATIN) Apply 1 application topically 2 (two) times daily. Patient not taking: Reported on 07/21/2021 06/21/18   [name], MD  Prenatal Vit-Fe Fumarate-FA (PRENATAL MULTIVITAMIN) TABS Take 1 tablet by mouth every evening.     [provider]  sertraline (ZOLOFT) 100 MG tablet Take 1 tablet (100 mg total) by mouth daily. 07/14/21   [name], MD  sulfamethoxazole-trimethoprim (BACTRIM DS) 800-160 MG tablet Take 1 tablet by mouth 2 (two) times daily for 5 days. 07/21/21 07/26/21  Merwyn Katos, MD    Physical Exam Vitals:  Vitals:   07/21/21 1001  BP: (!) 168/116   Patient's last menstrual period was 07/14/2021 (exact date).  General: NAD HEENT: normocephalic, anicteric Pulmonary: No increased work of breathing Neurologic: Grossly intact Psychiatric: mood appropriate, affect full    GAD 7 : Generalized Anxiety Score 07/21/2021 12/03/2019 10/29/2018  08/19/2018  Nervous, Anxious, on Edge 3 1 1 1   Control/stop worrying 3 0 0 1  Worry too much - different things 3 0 0 1  Trouble relaxing 3 1 0 0  Restless 3 0 0 0  Easily annoyed or irritable 3 0 0 1  Afraid - awful might happen 3 0 0 1  Total GAD 7 Score 21 2 1 5   Anxiety Difficulty Very difficult Not difficult at all Not difficult at all Not difficult at all    Depression screen [facility] 2/9 07/21/2021 12/03/2019 10/29/2018  Decreased Interest 1 0 0  Down, Depressed, Hopeless 1 1 0  PHQ - 2 Score 2 1 0  Altered sleeping 3 0 0  Tired, decreased energy 2 1 0  Change in appetite 2 0 0  Feeling bad or failure about yourself  2 0 0  Trouble concentrating 1 0 0  Moving slowly or fidgety/restless 1 0 0  Suicidal thoughts 0 0 0  PHQ-9 Score 13 2 0  Difficult doing work/chores Very difficult - -  Depression screen [facility] 2/9 07/21/2021 12/03/2019 10/29/2018 08/19/2018 07/19/2018  Decreased Interest 1 0 0 0 0  Down, Depressed, Hopeless 1 1 0 1 0  PHQ - 2 Score 2 1 0 1 0  Altered sleeping 3 0 0 0 0  Tired, decreased energy 2 1 0 0 0  Change in appetite 2 0 0 1 0  Feeling bad or failure about yourself  2 0 0 0 0  Trouble concentrating 1 0 0 0 0  Moving slowly or fidgety/restless 1 0 0 0 0  Suicidal thoughts 0 0 0 0 0  PHQ-9 Score 13 2 0 2 0  Difficult doing work/chores Very difficult - - - -     Assessment: 35 y.o. [id number] follow up anxiety/depression  Plan: Problem List Items Addressed This Visit   None Visit Diagnoses     Anxiety and depression    -  Primary   Relevant Medications   hydrOXYzine (ATARAX/VISTARIL) 25 MG tablet   traZODone (DESYREL) 50 MG tablet   FLUoxetine (PROZAC) 20 MG capsule       1) Anxiety/Depression - discontinue Zoloft - switch to Prozac 20mg  po daily - vistaril prn - trazodone for sleep, could also consider addition of remeron  - was also previously on buspar consider adding as tritrating prozac  2) Thyroid and B12 screen has not been obtained  previously  3) Return in about 2 weeks (around 08/04/2021) for postpartum (medication follow up).    08/06/2021, MD, [name] [facility], [facility] 07/21/2021, 10:19 AM

## 2021-07-21 NOTE — ED Triage Notes (Signed)
Patient ambulatory to triage with steady gait, without difficulty or distress noted; pt reports taking anxiety meds since high school; has been trying to wean self off but having return of panic attacks; began taking sertraline on Thursday and now with insomnia; st attempted to call Dr Chauncey Cruel regarding side effect but didn't get return call; pt also c/o dysuria  with lower back and abd pain; taking macrobid without relief (was rx over video visit, never had UA)

## 2021-07-25 ENCOUNTER — Telehealth: Payer: Self-pay

## 2021-07-25 NOTE — Telephone Encounter (Signed)
Pt calling; went to ED for uti; is on day four of antibx and still has burning with voiding, frequency, itching past two days; is drinking extra fluids. [facility] in [place], [place]  [phone]  Left detailed msg to be seen at an UC there.

## 2021-07-26 NOTE — Telephone Encounter (Signed)
Pt calling; called yesterday and hasn't gotten a response; took last dose or UTI med this morning; still having burning occasionally; frequency after emptying bladder; also having itching; used boric acid last HS and is now having more burning; [place].  [phone]  Pt aware I left her a msg yesterday; the best thing for her to do is to go to an UC down there and be checked out.

## 2021-08-01 ENCOUNTER — Ambulatory Visit (INDEPENDENT_AMBULATORY_CARE_PROVIDER_SITE_OTHER): Payer: Managed Care, Other (non HMO) | Admitting: Nurse Practitioner

## 2021-08-01 ENCOUNTER — Other Ambulatory Visit: Payer: Self-pay

## 2021-08-01 ENCOUNTER — Encounter: Payer: Self-pay | Admitting: Nurse Practitioner

## 2021-08-01 VITALS — BP 160/80 | HR 123 | Temp 98.3°F | Ht 63.0 in | Wt 212.6 lb

## 2021-08-01 DIAGNOSIS — R300 Dysuria: Secondary | ICD-10-CM

## 2021-08-01 DIAGNOSIS — R030 Elevated blood-pressure reading, without diagnosis of hypertension: Secondary | ICD-10-CM | POA: Diagnosis not present

## 2021-08-01 LAB — WET PREP FOR TRICH, YEAST, CLUE

## 2021-08-01 NOTE — Progress Notes (Signed)
Subjective:    Patient ID: [name], female    DOB: 01/25/1986, 35 y.o.   MRN: [id number]  HPI: [name] is a 35 y.o. female presenting for urinary tract infection symptoms.  Chief Complaint  Patient presents with   Urinary Tract Infection    Possible UTI , symptoms x1 week seen at ER with no relief.   URINARY SYMPTOMS Duration: 2 weeks ago Dysuria: burning Urinary frequency: yes Urgency: yes Small volume voids: yes Symptom severity: no Urinary incontinence: no Foul odor: no Hematuria: no Abdominal pain: no Back pain: no Suprapubic pain/pressure: yes Flank pain: no Fever:  no Nausea: yes Vomiting: no Relief with pyridium: yes; some Status: up and down Previous urinary tract infection: yes Recurrent urinary tract infection: no Sexual activity: currently sexually active with 1 partner History of sexually transmitted disease: no Vaginal discharge: yes; clear, some itching last week this is better now Treatments attempted: multiple antibiotics  Patient's blood pressure is elevated and she reports this happens when she feels anxious.  They are currently trying to adjust her anxiety medications.  Allergies  Allergen Reactions   Amoxicillin Itching    Has patient had a PCN reaction causing immediate rash, facial/tongue/throat swelling, SOB or lightheadedness with hypotension: No Has patient had a PCN reaction causing severe rash involving mucus membranes or skin necrosis: No Has patient had a PCN reaction that required hospitalization: No Has patient had a PCN reaction occurring within the last 10 years: No If all of the above answers are "NO", then may proceed with Cephalosporin use.    Keflex [Cephalexin] Itching    Outpatient Encounter Medications as of 08/01/2021  Medication Sig   acetaminophen (TYLENOL) 500 MG tablet Take 1,000 mg by mouth every 6 (six) hours as needed (for pain).   cetirizine (ZYRTEC) 10 MG tablet Take 10 mg by mouth every evening.     ciprofloxacin (CIPRO) 500 MG tablet Take 500 mg by mouth 2 (two) times daily.   diphenhydrAMINE (BENADRYL) 25 MG tablet Take 25 mg by mouth every 6 (six) hours as needed.   esomeprazole (NEXIUM) 20 MG capsule Take 20 mg by mouth every evening.   fluconazole (DIFLUCAN) 150 MG tablet Take 150 mg by mouth once.   FLUoxetine (PROZAC) 20 MG capsule Take 1 capsule (20 mg total) by mouth daily.   hydrOXYzine (ATARAX/VISTARIL) 25 MG tablet Take 1 tablet (25 mg total) by mouth every 6 (six) hours as needed for anxiety.   Prenatal Vit-Fe Fumarate-FA (PRENATAL MULTIVITAMIN) TABS Take 1 tablet by mouth every evening.    traZODone (DESYREL) 50 MG tablet Take 1 tablet (50 mg total) by mouth at bedtime as needed for sleep.   [DISCONTINUED] nitrofurantoin, macrocrystal-monohydrate, (MACROBID) 100 MG capsule Take 100 mg by mouth 2 (two) times daily.   [DISCONTINUED] norethindrone (MICRONOR,CAMILA,ERRIN) 0.35 MG tablet Take 1 tablet (0.35 mg total) by mouth daily.   No facility-administered encounter medications on file as of 08/01/2021.    Patient Active Problem List   Diagnosis Date Noted   Uterine scar from previous cesarean delivery affecting pregnancy 05/09/2018   Indication for care in labor and delivery, antepartum 05/09/2018   Pregnancy 04/24/2018   Hematuria 04/15/2018   Polyhydramnios affecting pregnancy in third trimester 03/20/2018   Anemia in pregnancy, third trimester 03/13/2018   Chronic hypertension during pregnancy, antepartum 11/29/2017   Anxiety 11/29/2017   Hiatal hernia 10/04/2017   Mitral valve disorder 10/04/2017   Palpitations 10/04/2017   Scoliosis deformity of spine 10/04/2017  Supervision of high risk pregnancy, antepartum 09/25/2017   History of gestational hypertension 09/25/2017   History of cesarean delivery, currently pregnant 09/25/2017    Past Medical History:  Diagnosis Date   Anxiety    History of hiatal hernia    Hypertension    with pregnancy. reads  normal at home no meds   Mitral valve disorder in pregnancy    Palpitations    Scoliosis    Trouble in sleeping     Relevant past medical, surgical, family and social history reviewed and updated as indicated. Interim medical history since our last visit reviewed.  Review of Systems Per HPI unless specifically indicated above     Objective:    BP (!) 160/80   Pulse (!) 123   Temp 98.3 F (36.8 C)   Ht 5\' 3"  (1.6 m)   Wt 212 lb 9.6 oz (96.4 kg)   LMP 07/14/2021 (Exact Date)   SpO2 99%   BMI 37.66 kg/m   Wt Readings from Last 3 Encounters:  08/01/21 212 lb 9.6 oz (96.4 kg)  07/21/21 207 lb (93.9 kg)  07/21/21 200 lb (90.7 kg)    Physical Exam Vitals and nursing note reviewed. Exam conducted with a chaperone present [facility]).  Constitutional:      General: She is not in acute distress.    Appearance: Normal appearance. She is obese. She is not toxic-appearing.  Abdominal:     General: Abdomen is flat. Bowel sounds are normal. There is no distension.     Palpations: Abdomen is soft.     Tenderness: There is no right CVA tenderness or left CVA tenderness.  Genitourinary:    General: Normal vulva.     Exam position: Lithotomy position.     Pubic Area: No rash.      Labia:        Right: No rash or lesion.        Left: No rash or lesion.      Vagina: Normal. No vaginal discharge or tenderness.  Lymphadenopathy:     Lower Body: No right inguinal adenopathy. No left inguinal adenopathy.  Skin:    General: Skin is warm and dry.     Coloration: Skin is not jaundiced or pale.     Findings: No erythema or rash.  Neurological:     Mental Status: She is alert and oriented to person, place, and time.     Motor: No weakness.     Gait: Gait normal.  Psychiatric:        Mood and Affect: Mood normal.        Behavior: Behavior normal.        Thought Content: Thought content normal.      Assessment & Plan:  1. Dysuria Acute.  UA today shows trace protein.  Microscopic  examination reveals 0-5 squamous epithelial cells, few bacteria.  Will send urine for culture and follow.  Wet prep today negative for clue cells, yeast, and trichomonas.   Will send urine off for a culture.  I encouraged the patient to finish the ciprofloxacin to completion and take 1 dose of diflucan after antibiotic completed.  Follow up if symptoms persist after this.   - Urinalysis, Routine w reflex microscopic - Urine Culture - WET PREP FOR TRICH, YEAST, CLUE  2. Elevated blood pressure reading in office without diagnosis of hypertension Encouraged the patient to check BP at home when feeling more calm and notify [facility] if readings > 130/80 consistently.   Follow  up plan: Return if symptoms worsen or fail to improve.

## 2021-08-02 ENCOUNTER — Ambulatory Visit: Payer: Managed Care, Other (non HMO) | Admitting: Obstetrics and Gynecology

## 2021-08-02 LAB — URINALYSIS, ROUTINE W REFLEX MICROSCOPIC
Bilirubin Urine: NEGATIVE
Casts: NONE SEEN /LPF
Crystals: NONE SEEN /HPF
Glucose, UA: NEGATIVE
Hgb urine dipstick: NEGATIVE
Ketones, ur: NEGATIVE
Leukocytes,Ua: NEGATIVE
Nitrite: NEGATIVE
RBC / HPF: NONE SEEN /HPF (ref 0–2)
Specific Gravity, Urine: 1.022 (ref 1.001–1.035)
WBC, UA: NONE SEEN /HPF (ref 0–5)
Yeast: NONE SEEN /HPF
pH: 5.5 (ref 5.0–8.0)

## 2021-08-02 LAB — URINE CULTURE
MICRO NUMBER:: 12633737
Result:: NO GROWTH
SPECIMEN QUALITY:: ADEQUATE

## 2021-08-02 LAB — MICROSCOPIC MESSAGE

## 2021-08-04 ENCOUNTER — Ambulatory Visit (INDEPENDENT_AMBULATORY_CARE_PROVIDER_SITE_OTHER): Payer: Managed Care, Other (non HMO) | Admitting: Obstetrics and Gynecology

## 2021-08-04 ENCOUNTER — Encounter: Payer: Self-pay | Admitting: Obstetrics and Gynecology

## 2021-08-04 DIAGNOSIS — F418 Other specified anxiety disorders: Secondary | ICD-10-CM | POA: Diagnosis not present

## 2021-08-04 DIAGNOSIS — F32A Depression, unspecified: Secondary | ICD-10-CM

## 2021-08-04 DIAGNOSIS — R300 Dysuria: Secondary | ICD-10-CM | POA: Diagnosis not present

## 2021-08-04 MED ORDER — FLUOXETINE HCL 40 MG PO CAPS: 40.0000 mg | ORAL_CAPSULE | Freq: Every day | ORAL | 2 refills | Status: DC

## 2021-08-04 NOTE — Progress Notes (Signed)
Obstetrics & Gynecology Office Visit   Chief Complaint:  Chief Complaint  Patient presents with   Follow-up    Phone visit - follow up medication. Thinks she needs increase in dose, still having burning/discomfort after urination.    History of Present Illness: The patient is a 35 y.o. female presenting follow up for symptoms of anxiety and depression.  The patient is currently taking Prozac 20mg  for the management of her symptoms.  She has not had any recent situational stressors.  She reports overall good response to starting Prozac, without any of the side-effects she noted on Zoloft restart.  She denies anhedonia, risk taking behavior, agorophobia, feelings of guilt, feelings of worthlessness, suicidal ideation, homicidal ideation, auditory hallucinations, and visual hallucinations. Symptoms have improved since last visit.     The patient does have a pre-existing history of depression and anxiety.  She  does not a prior history of suicide attempts.  Previous treatment tied include Zoloft  Continues to report dysuria with again negative urine culture by PCP.    Review of Systems:  Review of Systems  Constitutional: Negative.   Gastrointestinal:  Negative for nausea and vomiting.  Neurological:  Negative for headaches.  Psychiatric/Behavioral:  Negative for depression, hallucinations, memory loss, substance abuse and suicidal ideas. The patient is nervous/anxious and has insomnia.     Past Medical History:  Past Medical History:  Diagnosis Date   Anxiety    History of hiatal hernia    Hypertension    with pregnancy. 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    Palpitations    Scoliosis    Trouble in sleeping     Past Surgical History:  Past Surgical History:  Procedure Laterality Date   CESAREAN SECTION N/A 03/04/2013   Procedure: CESAREAN SECTION;  Surgeon: 03/06/2013, MD;  Location: [facility];  Service: Obstetrics;  Laterality: N/A;    CESAREAN SECTION N/A 05/09/2018   Procedure: CESAREAN SECTION;  Surgeon: 05/11/2018, MD;  Location: [facility] ORS;  Service: Obstetrics;  Laterality: N/A;    Gynecologic History: Patient's last menstrual period was 07/14/2021 (exact date).  Obstetric History: 07/16/2021  Family History:  Family History  Problem Relation Age of Onset   Diabetes Mother    Hypertension Mother    Kidney disease Mother    Hypertension Father     Social History:  Social History   Socioeconomic History   Marital status: Married    Spouse name: Not on file   Number of children: 1   Years of education: 18   Highest education level: Not on file  Occupational History   Occupation: self employed  Tobacco Use   Smoking status: Never   Smokeless tobacco: Never  Vaping Use   Vaping Use: Never used  Substance and Sexual Activity   Alcohol use: No   Drug use: No   Sexual activity: Yes    Birth control/protection: Pill  Other Topics Concern   Not on file  Social History Narrative   Not on file   Social Determinants of Health   Financial Resource Strain: Not on file  Food Insecurity: Not on file  Transportation Needs: Not on file  Physical Activity: Not on file  Stress: Not on file  Social Connections: Not on file  Intimate Partner Violence: Not on file    Allergies:  Allergies  Allergen Reactions   Amoxicillin Itching    Has patient had a PCN reaction causing  immediate rash, facial/tongue/throat swelling, SOB or lightheadedness with hypotension: No Has patient had a PCN reaction causing severe rash involving mucus membranes or skin necrosis: No Has patient had a PCN reaction that required hospitalization: No Has patient had a PCN reaction occurring within the last 10 years: No If all of the above answers are "NO", then may proceed with Cephalosporin use.    Keflex [Cephalexin] Itching    Medications: Prior to Admission medications   Medication Sig Start Date End Date Taking?  Authorizing Provider  acetaminophen (TYLENOL) 500 MG tablet Take 1,000 mg by mouth every 6 (six) hours as needed (for pain).    [provider]  cetirizine (ZYRTEC) 10 MG tablet Take 10 mg by mouth every evening.     [provider]  ciprofloxacin (CIPRO) 500 MG tablet Take 500 mg by mouth 2 (two) times daily. 07/26/21   [provider]  diphenhydrAMINE (BENADRYL) 25 MG tablet Take 25 mg by mouth every 6 (six) hours as needed.    [provider]  esomeprazole (NEXIUM) 20 MG capsule Take 20 mg by mouth every evening.    [provider]  fluconazole (DIFLUCAN) 150 MG tablet Take 150 mg by mouth once. 07/26/21   [provider]  FLUoxetine (PROZAC) 20 MG capsule Take 1 capsule (20 mg total) by mouth daily. 07/21/21   [name], MD  hydrOXYzine (ATARAX/VISTARIL) 25 MG tablet Take 1 tablet (25 mg total) by mouth every 6 (six) hours as needed for anxiety. 07/21/21   [name], MD  Prenatal Vit-Fe Fumarate-FA (PRENATAL MULTIVITAMIN) TABS Take 1 tablet by mouth every evening.     [provider]  traZODone (DESYREL) 50 MG tablet Take 1 tablet (50 mg total) by mouth at bedtime as needed for sleep. 07/21/21   [name], MD    Physical Exam Vitals: There were no vitals filed for this visit. Patient's last menstrual period was 07/14/2021 (exact date).  No physical exam as this was a remote telephone visit to promote social distancing during the current COVID-19 Pandemic   GAD 7 : Generalized Anxiety Score 08/04/2021 07/21/2021 12/03/2019 10/29/2018  Nervous, Anxious, on Edge 2 3 1 1   Control/stop worrying 2 3 0 0  Worry too much - different things 2 3 0 0  Trouble relaxing 2 3 1  0  Restless 1 3 0 0  Easily annoyed or irritable 1 3 0 0  Afraid - awful might happen 2 3 0 0  Total GAD 7 Score 12 21 2 1   Anxiety Difficulty Somewhat difficult Very difficult Not difficult at all Not difficult at all    Depression screen [facility]  2/9 08/04/2021 07/21/2021 12/03/2019  Decreased Interest 1 1 0  Down, Depressed, Hopeless 1 1 1   PHQ - 2 Score 2 2 1   Altered sleeping 2 3 0  Tired, decreased energy 2 2 1   Change in appetite 1 2 0  Feeling bad or failure about yourself  1 2 0  Trouble concentrating 1 1 0  Moving slowly or fidgety/restless 0 1 0  Suicidal thoughts 0 0 0  PHQ-9 Score 9 13 2   Difficult doing work/chores Somewhat difficult Very difficult -    Depression screen [facility] 2/9 08/04/2021 07/21/2021 12/03/2019 10/29/2018 08/19/2018  Decreased Interest 1 1 0 0 0  Down, Depressed, Hopeless 1 1 1  0 1  PHQ - 2 Score 2 2 1  0 1  Altered sleeping 2 3 0 0 0  Tired, decreased energy 2 2 1  0 0  Change in appetite 1 2 0 0 1  Feeling bad or failure about yourself  1 2 0 0 0  Trouble concentrating 1 1 0 0 0  Moving slowly or fidgety/restless 0 1 0 0 0  Suicidal thoughts 0 0 0 0 0  PHQ-9 Score 9 13 2  0 2  Difficult doing work/chores Somewhat difficult Very difficult - - -     Assessment: 35 y.o. 31 follow up anxiety and depression  Plan: Problem List Items Addressed This Visit   None Visit Diagnoses     Dysuria    -  Primary   Relevant Orders   Ambulatory referral to Urology       1) Anxiety/Depression - good response to switch to Prozac with some continued symptoms still present.  Discussed continuing current dose vs dose increase.  Patient opts for dose increase at this time - increase prozac to 40mg  po daily  2) Dysuria  - negative UCx x 2 negative wet mount referral to urology to [place] for IC  3) Thyroid and B12 screen has not been obtained previously  4) Telephone Time: 13:35min  5) Return in about 2 weeks (around 08/18/2021) for medication follow up phone.    0m, MD, 14/09/2020 [facility], [facility] 08/04/2021, 12:18 PM

## 2021-08-09 ENCOUNTER — Encounter: Payer: Self-pay | Admitting: Urology

## 2021-08-09 ENCOUNTER — Ambulatory Visit (INDEPENDENT_AMBULATORY_CARE_PROVIDER_SITE_OTHER): Payer: Self-pay | Admitting: Urology

## 2021-08-09 ENCOUNTER — Other Ambulatory Visit
Admission: RE | Admit: 2021-08-09 | Discharge: 2021-08-09 | Disposition: A | Discharge status: Home / Self Care | Payer: Managed Care, Other (non HMO) | Attending: Urology | Admitting: Urology

## 2021-08-09 ENCOUNTER — Other Ambulatory Visit: Payer: Self-pay

## 2021-08-09 ENCOUNTER — Other Ambulatory Visit: Payer: Self-pay | Admitting: *Deleted

## 2021-08-09 VITALS — BP 163/103 | HR 114 | Ht 63.0 in | Wt 208.0 lb

## 2021-08-09 DIAGNOSIS — R300 Dysuria: Secondary | ICD-10-CM

## 2021-08-09 DIAGNOSIS — R102 Pelvic and perineal pain: Secondary | ICD-10-CM

## 2021-08-09 LAB — URINALYSIS, COMPLETE (UACMP) WITH MICROSCOPIC
Glucose, UA: NEGATIVE mg/dL
Hgb urine dipstick: NEGATIVE
Ketones, ur: 15 mg/dL — AB
Leukocytes,Ua: NEGATIVE
Nitrite: NEGATIVE
Specific Gravity, Urine: 1.025 (ref 1.005–1.030)
pH: 5.5 (ref 5.0–8.0)

## 2021-08-09 MED ORDER — CELECOXIB 200 MG PO CAPS: 200.0000 mg | ORAL_CAPSULE | Freq: Two times a day (BID) | ORAL | 0 refills | Status: DC

## 2021-08-09 NOTE — Patient Instructions (Signed)
Urethritis, Adult °Urethritis is a swelling (inflammation) of the urethra. The urethra is the tube that drains urine from the bladder. It is important to get treatment for this condition early. Delayed treatment may lead to complications, such as an infection in the urinary tract (ureters, kidneys, and bladder). °What are the causes? °This condition may be caused by: °Germs that are spread through sexual contact. This is the leading cause of urethritis. This may include bacterial or viral infections. °Injury to the urethra. This can happen after a thin, flexible tube (catheter) is inserted into the urethra to drain urine, or after medical instruments or foreign bodies are inserted into the area. °Chemical irritation. This may include contact with spermicide or prolonged contact with chemicals in bubble bath, shampoo, or perfumed soaps. °A disease that causes inflammation. This is rare. °What increases the risk? °The following factors may make you more likely to develop this condition: °Having sex without using a condom. °Having multiple sexual partners. °Having poor hygiene. °What are the signs or symptoms? °Symptoms of this condition include: °Pain with urination. °Frequent urination. °An urgent need to urinate. °Itching and pain in the vagina or penis. °Discharge or bleeding coming from the penis. °Most women have no symptoms. °How is this diagnosed? °This condition may be diagnosed based on: °Your symptoms. °Your medical history. °A physical exam. °Tests may also be done. These may include: °Urine tests. °Swabs from the urethra. °How is this treated? °Treatment for this condition depends on the cause. °Urethritis caused by a bacterial infection is treated with antibiotic medicine. °Sexual partners must also be treated. °Follow these instructions at home: °Medicines °Take over-the-counter and prescription medicines only as told by your health care provider. °If you were prescribed an antibiotic, take it as told by  your health care provider. Do not stop taking the antibiotic even if you start to feel better. °Lifestyle °Avoid using perfumed soaps, bubble bath, and shampoo when you bathe or shower. Rinse the vaginal area after bathing. °Wear cotton underwear. Not wearing underwear when going to sleep can help. °Make sure to wipe from front to back after using the toilet if you are female. °Do not have sex until your health care provider approves. When you do have sex, be sure to practice safe sex. °Any sexual partners you have had in the past 60 days should be treated. °General instructions °Drink enough fluid to keep your urine pale yellow. °It is up to you to get your test results. Ask your health care provider, or the department that is doing the test, when your results will be ready. °Keep all follow-up visits. This is important. °Get tested again 3 months after treatment to make sure the infection is gone. It is important that your sexual partner also gets tested again. °Contact a health care provider if: °Your symptoms have not improved after 3 days. °Your symptoms get worse. °You have eye redness or pain. °You develop abdominal pain or pelvic pain (in females). °You develop joint pain or a rash. °You have a fever or chills. °Get help right away if: °You have severe pain in the belly, back, or side. °You vomit repeatedly. °Summary °Urethritis is a swelling (inflammation) of the urethra. °Germs that are spread through sexual contact are the most common cause of this condition. °It is important to get treatment for this condition early. Delayed treatment may lead to complications. °Treatment for this condition depends on the cause. Any sexual partners must also be treated. °This information is not intended   to replace advice given to you by your health care provider. Make sure you discuss any questions you have with your health care provider. °Document Revised: 04/11/2020 Document Reviewed: 04/11/2020 °Elsevier Patient  Education © 2022 [employer] ° °

## 2021-08-09 NOTE — Progress Notes (Signed)
   08/09/21 1:47 PM   [name] [birth year] [id number]  CC: Dysuria  HPI: 35 year old female with anxiety who reports 3 weeks of dysuria and pelvic discomfort after taking a bath.  She was told she had a UTI at some point and has been treated with multiple antibiotics including nitrofurantoin, Bactrim, and Cipro, as well as Diflucan.  She also is having some bilateral back pain.  Her symptoms have improved over the last week somewhat.  She denies any significant urgency and frequency, denies any nocturia.  She also took Azo which was helpful.  She feels like her anxiety has worsened over the last few weeks as well.  I reviewed Dr. Ramiro Harvest notes.  She underwent a C-section in 2014 and 2019.  Urinalysis today is pending.  Most recent urinalysis from 11/14 showed 0-5 squamous cells, 0 WBCs, 0 RBCs, negative leukocytes, nitrite negative, few bacteria, and culture was negative  PMH: Past Medical History:  Diagnosis Date   Anxiety    History of hiatal hernia    Hypertension    with pregnancy. 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    Palpitations    Scoliosis    Trouble in sleeping     Surgical History: Past Surgical History:  Procedure Laterality Date   CESAREAN SECTION N/A 03/04/2013   Procedure: CESAREAN SECTION;  Surgeon: Bing Plume, MD;  Location: [facility];  Service: Obstetrics;  Laterality: N/A;   CESAREAN SECTION N/A 05/09/2018   Procedure: CESAREAN SECTION;  Surgeon: [name], MD;  Location: [facility] OR[facility];  Service: Obstetrics;  Laterality: N/A;      Family History: Family History  Problem Relation Age of Onset   Diabetes Mother    Hypertension Mother    Kidney disease Mother    Hypertension Father     Social History:  reports that she has never smoked. She has never used smokeless tobacco. She reports that she does not drink alcohol and does not use drugs.  Physical Exam: LMP 07/14/2021 (Exact Date)    Constitutional:   Alert and oriented, No acute distress. Cardiovascular: No clubbing, cyanosis, or edema. Respiratory: Normal respiratory effort, no increased work of breathing. GI: Abdomen is soft, nontender, nondistended, no abdominal masses  Laboratory Data: Reviewed  Assessment & Plan:   35 year old female with significant anxiety and 3 weeks of dysuria that is improving over the last few days.  Urine culture 11/14 was negative.  We discussed possible etiologies including infection, inflammation/urethritis, or less common causes like interstitial cystitis.  Reassurance was provided regarding her symptoms that are improving over the last few days, and I recommended a trial of Celebrex twice daily for 5 days.  Return precautions were discussed at length.  We also discussed the relationship between stress/anxiety and pelvic pain.  Consider cystoscopy in the future if no improvement, or referral to pelvic floor physical therapy.  Celebrex 200 mg twice daily x5 days Behavioral strategies discussed RTC 6 weeks symptom check  Legrand Rams, MD 08/09/2021  [facility] [address], [address] [place], [place] [address] [phone]

## 2021-08-10 ENCOUNTER — Telehealth: Payer: Self-pay

## 2021-08-10 NOTE — Telephone Encounter (Signed)
Called pt informed her of the information below. Pt voiced understanding.  

## 2021-08-10 NOTE — Telephone Encounter (Signed)
-----   Message from Sondra Come, MD sent at 08/10/2021  8:22 AM EST ----- Urinalysis contaminated with skin cells but no evidence of infection, do not recommend any additional antibiotics  Legrand Rams, MD 08/10/2021

## 2021-08-18 ENCOUNTER — Ambulatory Visit (INDEPENDENT_AMBULATORY_CARE_PROVIDER_SITE_OTHER): Payer: Managed Care, Other (non HMO) | Admitting: Obstetrics and Gynecology

## 2021-08-18 ENCOUNTER — Telehealth: Payer: Self-pay

## 2021-08-18 ENCOUNTER — Other Ambulatory Visit: Payer: Self-pay

## 2021-08-18 DIAGNOSIS — F32A Depression, unspecified: Secondary | ICD-10-CM

## 2021-08-18 DIAGNOSIS — F419 Anxiety disorder, unspecified: Secondary | ICD-10-CM

## 2021-08-18 MED ORDER — HYDROXYZINE HCL 25 MG PO TABS: 25.0000 mg | ORAL_TABLET | Freq: Four times a day (QID) | ORAL | 2 refills | Status: DC | PRN

## 2021-08-18 MED ORDER — FLUOXETINE HCL 40 MG PO CAPS: 40.0000 mg | ORAL_CAPSULE | Freq: Every day | ORAL | 11 refills | Status: AC

## 2021-08-18 NOTE — Progress Notes (Signed)
I connected with [name] on 08/18/21 at 11:30 AM EST by telephone and verified that I am speaking with the correct person using two identifiers.   I discussed the limitations, risks, security and privacy concerns of performing an evaluation and management service by telephone and the availability of in person appointments. I also discussed with the patient that there may be a patient responsible charge related to this service. The patient expressed understanding and agreed to proceed.  The patient was at home I spoke with the patient from my workstation phone The names of people involved in this encounter were: [name] , and [name]   Obstetrics & Gynecology Office Visit   Chief Complaint:  Chief Complaint  Patient presents with   Follow-up    Phone visit - follow up medication, no concerns    History of Present Illness: The patient is a 35 y.o. female presenting follow up for symptoms of anxiety and depression.  The patient is currently taking Prozac 40mg  po daily  for the management of her symptoms.  She has not had any recent situational stressors.  Has seen urology with presumptive diagnosis or urethritis.  She reports good improvement in symptoms. Symptoms have improved since last visit.     The patient does have a pre-existing history of depression and anxiety.  She  does not a prior history of suicide attempts.    Review of Systems: Review of Systems  Constitutional: Negative.   Gastrointestinal:  Negative for nausea and vomiting.  Genitourinary:  Positive for dysuria. Negative for flank pain, frequency, hematuria and urgency.  Neurological:  Negative for headaches.  Psychiatric/Behavioral: Negative.      Past Medical History:  Past Medical History:  Diagnosis Date   Anxiety    History of hiatal hernia    Hypertension    with pregnancy. 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  Obstetric History: [name]  Family History:  Family History  Problem Relation Age of Onset   Diabetes Mother    Hypertension Mother    Kidney disease Mother    Hypertension Father     Social History:  Social History   Socioeconomic History   Marital status: Married    Spouse name: Not on file   Number of children: 1   Years of education: 18   Highest education level: Not on file  Occupational History   Occupation: self employed  Tobacco Use   Smoking status: Never   Smokeless tobacco: Never  Vaping Use   Vaping Use: Never used  Substance and Sexual Activity   Alcohol use: No   Drug use: No   Sexual activity: Yes    Birth control/protection: Pill  Other Topics Concern   Not on file  Social History Narrative   Not on file   Social Determinants of Health   Financial Resource Strain: Not on file  Food Insecurity: Not on file  Transportation Needs:  Not on file  Physical Activity: Not on file  Stress: Not on file  Social Connections: Not on file  Intimate Partner Violence: Not on file    Allergies:  Allergies  Allergen Reactions   Amoxicillin Itching    Has patient had a PCN reaction causing immediate rash, facial/tongue/throat swelling, SOB or lightheadedness with hypotension: No Has patient had a PCN reaction causing severe rash involving mucus membranes or skin necrosis: No Has patient had a PCN reaction that required hospitalization: No Has patient had a PCN reaction occurring within the last 10 years: No If all of  the above answers are "NO", then may proceed with Cephalosporin use.    Keflex [Cephalexin] Itching    Medications: Prior to Admission medications   Medication Sig Start Date End Date Taking? Authorizing Provider  celecoxib (CELEBREX) 200 MG capsule Take 1 capsule (200 mg total) by mouth 2 (two) times daily. 08/09/21  Yes Billey Co, MD  cetirizine (ZYRTEC) 10 MG tablet Take 10 mg by mouth every evening.    Yes [provider]  esomeprazole (NEXIUM) 20 MG capsule Take 20 mg by mouth every evening.   Yes [provider]  FLUoxetine (PROZAC) 40 MG capsule Take 1 capsule (40 mg total) by mouth daily. 08/04/21  Yes Malachy Mood, MD  hydrOXYzine (ATARAX/VISTARIL) 25 MG tablet Take 1 tablet (25 mg total) by mouth every 6 (six) hours as needed for anxiety. 07/21/21  Yes Malachy Mood, MD  traZODone (DESYREL) 50 MG tablet Take 1 tablet (50 mg total) by mouth at bedtime as needed for sleep. 07/21/21  Yes Malachy Mood, MD  acetaminophen (TYLENOL) 500 MG tablet Take 1,000 mg by mouth every 6 (six) hours as needed (for pain).    [provider]  diphenhydrAMINE (BENADRYL) 25 MG tablet Take 25 mg by mouth every 6 (six) hours as needed.    [provider]  fluconazole (DIFLUCAN) 150 MG tablet Take 150 mg by mouth once. Patient not taking: Reported on 08/18/2021 07/26/21   [provider]  Prenatal Vit-Fe Fumarate-FA (PRENATAL MULTIVITAMIN) TABS Take 1 tablet by mouth every evening.  Patient not taking: Reported on 08/18/2021    [provider]    Physical Exam Vitals: There were no vitals filed for this visit. No LMP recorded.  No physical exam as this was a remote telephone visit to promote social distancing during the current COVID-19 Pandemic   GAD 7 : Generalized Anxiety Score 08/18/2021 08/04/2021 07/21/2021 12/03/2019  Nervous, Anxious, on Edge 2 2 3 1   Control/stop worrying 1 2 3  0  Worry too much - different things 1 2 3  0   Trouble relaxing 1 2 3 1   Restless 1 1 3  0  Easily annoyed or irritable 2 1 3  0  Afraid - awful might happen 1 2 3  0  Total GAD 7 Score 9 12 21 2   Anxiety Difficulty Somewhat difficult Somewhat difficult Very difficult Not difficult at all    Depression screen [facility] 2/9 08/18/2021 08/04/2021 07/21/2021  Decreased Interest 1 1 1   Down, Depressed, Hopeless 1 1 1   PHQ - 2 Score 2 2 2   Altered sleeping 2 2 3   Tired, decreased energy 1 2 2   Change in appetite 1 1 2   Feeling bad or failure about yourself  1 1 2   Trouble concentrating 0 1 1  Moving slowly or fidgety/restless 0 0 1  Suicidal thoughts 0 0 0  PHQ-9 Score 7 9 13   Difficult doing work/chores  Somewhat difficult Somewhat difficult Very difficult    Depression screen [facility] 2/9 08/18/2021 08/04/2021 07/21/2021 12/03/2019 10/29/2018  Decreased Interest 1 1 1  0 0  Down, Depressed, Hopeless 1 1 1 1  0  PHQ - 2 Score 2 2 2 1  0  Altered sleeping 2 2 3  0 0  Tired, decreased energy 1 2 2 1  0  Change in appetite 1 1 2  0 0  Feeling bad or failure about yourself  1 1 2  0 0  Trouble concentrating 0 1 1 0 0  Moving slowly or fidgety/restless 0 0 1 0 0  Suicidal thoughts 0 0 0 0 0  PHQ-9 Score 7 9 13 2  0  Difficult doing work/chores Somewhat difficult Somewhat difficult Very difficult - -     Assessment: 35 y.o. [id number] medication follow up anxiety and depression  Plan: Problem List Items Addressed This Visit   None Visit Diagnoses     Anxiety and depression    -  Primary   Relevant Medications   FLUoxetine (PROZAC) 40 MG capsule   hydrOXYzine (ATARAX) 25 MG tablet       1) Anxiety / Depression - good improvement on current regimen.  Continue Prozac at current does.  2) Thyroid and B12 screen has been obtained previously  3) Telephone time 6:69min  4) Return in about 6 weeks (around 09/29/2021) for Medication follow up phone.    Malachy Mood, MD, [facility], [facility] 08/18/2021, 11:59  AM

## 2021-08-18 NOTE — Telephone Encounter (Signed)
Called patient for phone visit (11:30), LMOM for pt to ret call.

## 2021-09-20 ENCOUNTER — Ambulatory Visit: Payer: Self-pay | Admitting: Urology

## 2021-09-29 ENCOUNTER — Ambulatory Visit (INDEPENDENT_AMBULATORY_CARE_PROVIDER_SITE_OTHER): Payer: Managed Care, Other (non HMO) | Admitting: Obstetrics and Gynecology

## 2021-09-29 ENCOUNTER — Other Ambulatory Visit: Payer: Self-pay

## 2021-09-29 ENCOUNTER — Encounter: Payer: Self-pay | Admitting: Obstetrics and Gynecology

## 2021-09-29 DIAGNOSIS — F32A Depression, unspecified: Secondary | ICD-10-CM

## 2021-09-29 DIAGNOSIS — F419 Anxiety disorder, unspecified: Secondary | ICD-10-CM | POA: Diagnosis not present

## 2021-09-29 MED ORDER — TRAZODONE HCL 50 MG PO TABS: 50.0000 mg | ORAL_TABLET | Freq: Every evening | ORAL | 6 refills | Status: DC | PRN

## 2021-09-29 MED ORDER — HYDROXYZINE HCL 25 MG PO TABS: 25.0000 mg | ORAL_TABLET | Freq: Four times a day (QID) | ORAL | 6 refills | Status: AC | PRN

## 2021-09-29 NOTE — Progress Notes (Signed)
I connected with [name] on 09/29/21 at 11:15 AM EST by telephone and verified that I am speaking with the correct person using two identifiers.   I discussed the limitations, risks, security and privacy concerns of performing an evaluation and management service by telephone and the availability of in person appointments. I also discussed with the patient that there may be a patient responsible charge related to this service. The patient expressed understanding and agreed to proceed.  The patient was at home I spoke with the patient from my workstation phone The names of people involved in this encounter were: [name] , and [facility] Office Visit   Chief Complaint:  Chief Complaint  Patient presents with   Follow-up    Phone visit - 6wks, no concerns.    History of Present Illness: The patient is a 36 y.o. female presenting follow up for symptoms of anxiety and depression.  The patient is currently taking Prozac 40mg  po daily for the management of her symptoms.  She has not had any recent situational stressors.  She reports continued good control of symptoms has had to use vistaril and trazodone occasional to help with sleep.  She denies anhedonia, day time somnolence, risk taking behavior, irritability, social anxiety, agorophobia, feelings of guilt, feelings of worthlessness, suicidal ideation, homicidal ideation, auditory hallucinations, and visual hallucinations. Symptoms have improved since last visit.     The patient does have a pre-existing history of depression and anxiety.  She  does not a prior history of suicide attempts.    Review of Systems: Review of Systems  Constitutional: Negative.   Gastrointestinal:  Negative for nausea.  Neurological:  Negative for headaches.  Psychiatric/Behavioral: Negative.      Past Medical History:  Past Medical History:  Diagnosis Date   Anxiety    History of hiatal hernia    Hypertension     with pregnancy. 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    Palpitations    Scoliosis    Trouble in sleeping     Past Surgical History:  Past Surgical History:  Procedure Laterality Date   CESAREAN SECTION N/A 03/04/2013   Procedure: CESAREAN SECTION;  Surgeon: Melina Schools, MD;  Location: [facility];  Service: Obstetrics;  Laterality: N/A;   CESAREAN SECTION N/A 05/09/2018   Procedure: CESAREAN SECTION;  Surgeon: Malachy Mood, MD;  Location: [facility];  Service: Obstetrics;  Laterality: N/A;    Gynecologic History: No LMP recorded.  Obstetric History: [id number]  Family History:  Family History  Problem Relation Age of Onset   Diabetes Mother    Hypertension Mother    Kidney disease Mother    Hypertension Father     Social History:  Social History   Socioeconomic History   Marital status: Married    Spouse name: Not on file   Number of children: 1   Years of education: 18   Highest education level: Not on file  Occupational History   Occupation: self employed  Tobacco Use   Smoking status: Never   Smokeless tobacco: Never  Vaping Use   Vaping Use: Never used  Substance and Sexual Activity   Alcohol use: No   Drug use: No   Sexual activity: Yes    Birth control/protection: Pill  Other Topics Concern   Not on file  Social History Narrative   Not on file   Social Determinants of Health   Financial  Resource Strain: Not on file  Food Insecurity: Not on file  Transportation Needs: Not on file  Physical Activity: Not on file  Stress: Not on file  Social Connections: Not on file  Intimate Partner Violence: Not on file    Allergies:  Allergies  Allergen Reactions   Amoxicillin Itching    Has patient had a PCN reaction causing immediate rash, facial/tongue/throat swelling, SOB or lightheadedness with hypotension: No Has patient had a PCN reaction causing severe rash involving mucus membranes or skin necrosis: No Has patient had  a PCN reaction that required hospitalization: No Has patient had a PCN reaction occurring within the last 10 years: No If all of the above answers are "NO", then may proceed with Cephalosporin use.    Keflex [Cephalexin] Itching    Medications: Prior to Admission medications   Medication Sig Start Date End Date Taking? Authorizing Provider  cetirizine (ZYRTEC) 10 MG tablet Take 10 mg by mouth every evening.    Yes [provider]  diphenhydrAMINE (BENADRYL) 25 MG tablet Take 25 mg by mouth every 6 (six) hours as needed.   Yes [provider]  esomeprazole (NEXIUM) 20 MG capsule Take 20 mg by mouth every evening.   Yes [provider]  FLUoxetine (PROZAC) 40 MG capsule Take 1 capsule (40 mg total) by mouth daily. 08/18/21  Yes Malachy Mood, MD  hydrOXYzine (ATARAX) 25 MG tablet Take 1 tablet (25 mg total) by mouth every 6 (six) hours as needed for anxiety. 08/18/21  Yes Malachy Mood, MD  acetaminophen (TYLENOL) 500 MG tablet Take 1,000 mg by mouth every 6 (six) hours as needed (for pain).    [provider]  celecoxib (CELEBREX) 200 MG capsule Take 1 capsule (200 mg total) by mouth 2 (two) times daily. 08/09/21   Billey Co, MD  Prenatal Vit-Fe Fumarate-FA (PRENATAL MULTIVITAMIN) TABS Take 1 tablet by mouth every evening.  Patient not taking: Reported on 08/18/2021    [provider]  traZODone (DESYREL) 50 MG tablet Take 1 tablet (50 mg total) by mouth at bedtime as needed for sleep. 07/21/21   Malachy Mood, MD    Physical Exam Vitals: There were no vitals filed for this visit. No LMP recorded.  No physical exam as this was a remote telephone visit to promote social distancing during the current COVID-19 Pandemic     GAD 7 : Generalized Anxiety Score 09/29/2021 08/18/2021 08/04/2021 07/21/2021  Nervous, Anxious, on Edge 2 2 2 3   Control/stop worrying 1 1 2 3   Worry too much - different things 1 1 2 3   Trouble relaxing 0 1 2  3   Restless 0 1 1 3   Easily annoyed or irritable 1 2 1 3   Afraid - awful might happen 1 1 2 3   Total GAD 7 Score 6 9 12 21   Anxiety Difficulty Somewhat difficult Somewhat difficult Somewhat difficult Very difficult    Depression screen [facility] 2/9 09/29/2021 08/18/2021 08/04/2021  Decreased Interest 1 1 1   Down, Depressed, Hopeless 1 1 1   PHQ - 2 Score 2 2 2   Altered sleeping 1 2 2   Tired, decreased energy 1 1 2   Change in appetite 1 1 1   Feeling bad or failure about yourself  1 1 1   Trouble concentrating 0 0 1  Moving slowly or fidgety/restless 0 0 0  Suicidal thoughts 0 0 0  PHQ-9 Score 6 7 9   Difficult doing work/chores Somewhat difficult Somewhat difficult Somewhat difficult    Depression  screen [facility] 2/9 09/29/2021 08/18/2021 08/04/2021 07/21/2021 12/03/2019  Decreased Interest 1 1 1 1  0  Down, Depressed, Hopeless 1 1 1 1 1   PHQ - 2 Score 2 2 2 2 1   Altered sleeping 1 2 2 3  0  Tired, decreased energy 1 1 2 2 1   Change in appetite 1 1 1 2  0  Feeling bad or failure about yourself  1 1 1 2  0  Trouble concentrating 0 0 1 1 0  Moving slowly or fidgety/restless 0 0 0 1 0  Suicidal thoughts 0 0 0 0 0  PHQ-9 Score 6 7 9 13 2   Difficult doing work/chores Somewhat difficult Somewhat difficult Somewhat difficult Very difficult -     Assessment: 36 y.o. [id number] medication follow up anxiety/depression  Plan: Problem List Items Addressed This Visit   None Visit Diagnoses     Anxiety and depression    -  Primary   Relevant Medications   hydrOXYzine (ATARAX) 25 MG tablet   traZODone (DESYREL) 50 MG tablet       1) Anxiety/Depression - further improvement noted in GAD-7 and PHQ-9 continue current dose of Prozac 40mg  po daily with prn vistaril and prn trazodone  2) Thyroid and B12 screen has not been obtained previously  3) Telephone time spent with patient on this visit 6:8min  4) Return in about 9 months (around 06/29/2022) for annual.    Malachy Mood, MD,  [place] 09/29/2021, 11:07 AM

## 2022-03-06 LAB — FETAL NONSTRESS TEST

## 2022-05-19 ENCOUNTER — Observation Stay
Admission: EM | Admit: 2022-05-19 | Discharge: 2022-05-21 | Disposition: A | Discharge status: Home / Self Care | Payer: Managed Care, Other (non HMO) | Attending: Internal Medicine | Admitting: Internal Medicine

## 2022-05-19 ENCOUNTER — Other Ambulatory Visit: Payer: Self-pay

## 2022-05-19 ENCOUNTER — Emergency Department: Payer: Managed Care, Other (non HMO)

## 2022-05-19 ENCOUNTER — Encounter: Payer: Self-pay | Admitting: Internal Medicine

## 2022-05-19 DIAGNOSIS — K219 Gastro-esophageal reflux disease without esophagitis: Secondary | ICD-10-CM | POA: Diagnosis not present

## 2022-05-19 DIAGNOSIS — K922 Gastrointestinal hemorrhage, unspecified: Secondary | ICD-10-CM

## 2022-05-19 DIAGNOSIS — R000 Tachycardia, unspecified: Secondary | ICD-10-CM | POA: Insufficient documentation

## 2022-05-19 DIAGNOSIS — Z79899 Other long term (current) drug therapy: Secondary | ICD-10-CM | POA: Diagnosis not present

## 2022-05-19 DIAGNOSIS — Z20822 Contact with and (suspected) exposure to covid-19: Secondary | ICD-10-CM | POA: Insufficient documentation

## 2022-05-19 DIAGNOSIS — K625 Hemorrhage of anus and rectum: Secondary | ICD-10-CM | POA: Diagnosis not present

## 2022-05-19 DIAGNOSIS — F419 Anxiety disorder, unspecified: Secondary | ICD-10-CM | POA: Diagnosis present

## 2022-05-19 DIAGNOSIS — K449 Diaphragmatic hernia without obstruction or gangrene: Secondary | ICD-10-CM | POA: Diagnosis not present

## 2022-05-19 DIAGNOSIS — K5150 Left sided colitis without complications: Principal | ICD-10-CM | POA: Insufficient documentation

## 2022-05-19 DIAGNOSIS — I10 Essential (primary) hypertension: Secondary | ICD-10-CM | POA: Insufficient documentation

## 2022-05-19 DIAGNOSIS — Z6839 Body mass index (BMI) 39.0-39.9, adult: Secondary | ICD-10-CM | POA: Diagnosis not present

## 2022-05-19 DIAGNOSIS — E669 Obesity, unspecified: Secondary | ICD-10-CM | POA: Diagnosis not present

## 2022-05-19 DIAGNOSIS — K529 Noninfective gastroenteritis and colitis, unspecified: Secondary | ICD-10-CM | POA: Diagnosis present

## 2022-05-19 DIAGNOSIS — D72829 Elevated white blood cell count, unspecified: Secondary | ICD-10-CM | POA: Diagnosis not present

## 2022-05-19 DIAGNOSIS — R1084 Generalized abdominal pain: Secondary | ICD-10-CM | POA: Diagnosis present

## 2022-05-19 LAB — COMPREHENSIVE METABOLIC PANEL
ALT: 22 U/L (ref 0–44)
AST: 32 U/L (ref 15–41)
Albumin: 4.2 g/dL (ref 3.5–5.0)
Alkaline Phosphatase: 114 U/L (ref 38–126)
Anion gap: 10 (ref 5–15)
BUN: 11 mg/dL (ref 6–20)
CO2: 21 mmol/L — ABNORMAL LOW (ref 22–32)
Calcium: 9 mg/dL (ref 8.9–10.3)
Chloride: 106 mmol/L (ref 98–111)
Creatinine, Ser: 0.81 mg/dL (ref 0.44–1.00)
GFR, Estimated: >60 mL/min (ref >60)
Glucose, Bld: 133 mg/dL — ABNORMAL HIGH (ref 70–99)
Potassium: 3.6 mmol/L (ref 3.5–5.1)
Sodium: 137 mmol/L (ref 135–145)
Total Bilirubin: 0.6 mg/dL (ref 0.3–1.2)
Total Protein: 8.3 g/dL — ABNORMAL HIGH (ref 6.5–8.1)

## 2022-05-19 LAB — CBC WITH DIFFERENTIAL/PLATELET
Abs Immature Granulocytes: 0.13 10*3/uL — ABNORMAL HIGH (ref 0.00–0.07)
Basophils Absolute: 0 10*3/uL (ref 0.0–0.1)
Basophils Relative: 0 %
Eosinophils Absolute: 0 10*3/uL (ref 0.0–0.5)
Eosinophils Relative: 0 %
HCT: 36.7 % (ref 36.0–46.0)
Hemoglobin: 10.9 g/dL — ABNORMAL LOW (ref 12.0–15.0)
Immature Granulocytes: 1 %
Lymphocytes Relative: 3 %
Lymphs Abs: 0.6 10*3/uL — ABNORMAL LOW (ref 0.7–4.0)
MCH: 21.5 pg — ABNORMAL LOW (ref 26.0–34.0)
MCHC: 29.7 g/dL — ABNORMAL LOW (ref 30.0–36.0)
MCV: 72.2 fL — ABNORMAL LOW (ref 80.0–100.0)
Monocytes Absolute: 0.9 10*3/uL (ref 0.1–1.0)
Monocytes Relative: 4 %
Neutro Abs: 20.3 10*3/uL — ABNORMAL HIGH (ref 1.7–7.7)
Neutrophils Relative %: 92 %
Platelets: 389 10*3/uL (ref 150–400)
RBC: 5.08 MIL/uL (ref 3.87–5.11)
RDW: 15.8 % — ABNORMAL HIGH (ref 11.5–15.5)
WBC: 22 10*3/uL — ABNORMAL HIGH (ref 4.0–10.5)
nRBC: 0 % (ref 0.0–0.2)

## 2022-05-19 LAB — URINALYSIS, ROUTINE W REFLEX MICROSCOPIC
Bilirubin Urine: NEGATIVE
Glucose, UA: NEGATIVE mg/dL
Ketones, ur: 5 mg/dL — AB
Nitrite: NEGATIVE
Protein, ur: 30 mg/dL — AB
RBC / HPF: >50 RBC/hpf — ABNORMAL HIGH (ref 0–5)
Specific Gravity, Urine: 1.03 (ref 1.005–1.030)
pH: 5 (ref 5.0–8.0)

## 2022-05-19 LAB — C DIFFICILE QUICK SCREEN W PCR REFLEX
C Diff antigen: NEGATIVE
C Diff interpretation: NOT DETECTED
C Diff toxin: NEGATIVE

## 2022-05-19 LAB — HIV ANTIBODY (ROUTINE TESTING W REFLEX): HIV Screen 4th Generation wRfx: NONREACTIVE

## 2022-05-19 LAB — HEMOGLOBIN AND HEMATOCRIT, BLOOD
HCT: 33 % — ABNORMAL LOW (ref 36.0–46.0)
HCT: 32.2 % — ABNORMAL LOW (ref 36.0–46.0)
Hemoglobin: 10.1 g/dL — ABNORMAL LOW (ref 12.0–15.0)
Hemoglobin: 9.8 g/dL — ABNORMAL LOW (ref 12.0–15.0)

## 2022-05-19 LAB — SARS CORONAVIRUS 2 BY RT PCR: SARS Coronavirus 2 by RT PCR: NEGATIVE

## 2022-05-19 LAB — POC URINE PREG, ED: Preg Test, Ur: NEGATIVE

## 2022-05-19 LAB — TROPONIN I (HIGH SENSITIVITY): Troponin I (High Sensitivity): 4 ng/L (ref <18)

## 2022-05-19 LAB — LIPASE, BLOOD: Lipase: 30 U/L (ref 11–51)

## 2022-05-19 MED ORDER — ONDANSETRON HCL 4 MG/2ML IJ SOLN
4.0000 mg | Freq: Once | INTRAMUSCULAR | Status: AC
  Administered 2022-05-19: 4 mg via INTRAVENOUS
  Filled 2022-05-19: qty 2

## 2022-05-19 MED ORDER — FLUOXETINE HCL 20 MG PO CAPS
40.0000 mg | ORAL_CAPSULE | Freq: Every day | ORAL | Status: DC
  Filled 2022-05-19: qty 2

## 2022-05-19 MED ORDER — ONDANSETRON HCL 4 MG PO TABS: 4.0000 mg | ORAL_TABLET | Freq: Four times a day (QID) | ORAL | Status: DC | PRN

## 2022-05-19 MED ORDER — FLUOXETINE HCL 20 MG PO CAPS
40.0000 mg | ORAL_CAPSULE | Freq: Every day | ORAL | Status: DC
  Administered 2022-05-19 – 2022-05-20 (×2): 40 mg via ORAL
  Filled 2022-05-19 (×2): qty 2

## 2022-05-19 MED ORDER — PEG 3350-KCL-NA BICARB-NACL 420 G PO SOLR
4000.0000 mL | Freq: Once | ORAL | Status: AC
  Administered 2022-05-19: 4000 mL via ORAL
  Filled 2022-05-19: qty 4000

## 2022-05-19 MED ORDER — IOHEXOL 350 MG/ML SOLN
100.0000 mL | Freq: Once | INTRAVENOUS | Status: AC | PRN
  Administered 2022-05-19: 100 mL via INTRAVENOUS

## 2022-05-19 MED ORDER — ACETAMINOPHEN 500 MG PO TABS
1000.0000 mg | ORAL_TABLET | Freq: Four times a day (QID) | ORAL | Status: DC | PRN
  Administered 2022-05-19 – 2022-05-21 (×5): 1000 mg via ORAL
  Filled 2022-05-19 (×5): qty 2

## 2022-05-19 MED ORDER — ORAL CARE MOUTH RINSE: 15.0000 mL | OROMUCOSAL | Status: DC | PRN

## 2022-05-19 MED ORDER — PANTOPRAZOLE SODIUM 40 MG PO TBEC
40.0000 mg | DELAYED_RELEASE_TABLET | Freq: Every day | ORAL | Status: DC
  Administered 2022-05-19 – 2022-05-21 (×2): 40 mg via ORAL
  Filled 2022-05-19 (×3): qty 1

## 2022-05-19 MED ORDER — ONDANSETRON HCL 4 MG/2ML IJ SOLN: 4.0000 mg | Freq: Four times a day (QID) | INTRAMUSCULAR | Status: DC | PRN

## 2022-05-19 MED ORDER — HYDROXYZINE HCL 25 MG PO TABS: 25.0000 mg | ORAL_TABLET | Freq: Four times a day (QID) | ORAL | Status: DC | PRN

## 2022-05-19 MED ORDER — SODIUM CHLORIDE 0.9 % IV BOLUS
1000.0000 mL | Freq: Once | INTRAVENOUS | Status: AC
  Administered 2022-05-19: 1000 mL via INTRAVENOUS

## 2022-05-19 NOTE — ED Provider Notes (Addendum)
[facility] Provider Note    Event Date/Time   First MD Initiated Contact with Patient 05/19/22 [id number]     (approximate)   History   Abdominal Pain   HPI  [name] is a 36 y.o. female with history of UTI who comes in with bleeding.  Patient reports having a history of a external hemorrhoid that is close to her vagina per her OB team.  She denies any history of rectal bleeding previously.  She reports lower abdominal cramping on the left side with some rectal bleeding of bright red blood mixed into the stool noted this morning.  She reports having 1 other episode with bright red blood since being here but no stool came out.  She does report having multiple episodes of diarrhea prior to this starting and reports a little bit of nausea.  She does not really have any significant pain its more just feeling crampy and like she is on her period.  She does report having a history of anxiety and feels very anxious.  She denies any chest pain, shortness of breath.  Physical Exam   Triage Vital Signs: ED Triage Vitals  Enc Vitals Group     BP 05/19/22 0547 (!) 157/93     Pulse Rate 05/19/22 0547 (!) 134     Resp 05/19/22 0547 (!) 21     Temp 05/19/22 0547 97.6 F (36.4 C)     Temp Source 05/19/22 0547 Oral     SpO2 05/19/22 0547 100 %     Weight 05/19/22 0548 200 lb (90.7 kg)     Height 05/19/22 0548 5\' 3"  (1.6 m)     Head Circumference --      Peak Flow --      Pain Score 05/19/22 0548 2     Pain Loc --      Pain Edu? --      Excl. in GC? --     Most recent vital signs: Vitals:   05/19/22 0547 05/19/22 0705  BP: (!) 157/93 127/76  Pulse: (!) 134 (!) 116  Resp: (!) 21 18  Temp: 97.6 F (36.4 C)   SpO2: 100% 100%     General: Awake, no distress.  CV:  Good peripheral perfusion.  Tachycardic Resp:  Normal effort.  Abd:  No distention.  Slight tenderness in the lower abdomen Other:  Patient has a very minimal amount of bright red blood per rectum.   No external hemorrhoids are noted.  She does have a skin tag in her perineum area   ED Results / Procedures / Treatments   Labs (all labs ordered are listed, but only abnormal results are displayed) Labs Reviewed  COMPREHENSIVE METABOLIC PANEL - Abnormal; Notable for the following components:      Result Value   CO2 21 (*)    Glucose, Bld 133 (*)    Total Protein 8.3 (*)    All other components within normal limits  CBC WITH DIFFERENTIAL/PLATELET - Abnormal; Notable for the following components:   WBC 22.0 (*)    Hemoglobin 10.9 (*)    MCV 72.2 (*)    MCH 21.5 (*)    MCHC 29.7 (*)    RDW 15.8 (*)    Neutro Abs 20.3 (*)    Lymphs Abs 0.6 (*)    Abs Immature Granulocytes 0.13 (*)    All other components within normal limits  LIPASE, BLOOD  URINALYSIS, ROUTINE W REFLEX MICROSCOPIC  POC URINE PREG, ED  EKG  My interpretation of EKG:  Sinus tachycardia rate of 115 without any ST elevation or T wave inversions, normal intervals  RADIOLOGY I have reviewed the CT personally and interpreted no evidence of active bleeding  IMPRESSION: 1. No active bleeding into the gastrointestinal tract. No bowel pathology identified by CT. 2. Evidence of hepatic steatosis. 3. Small hiatal hernia.   PROCEDURES:  Critical Care performed: No  .1-3 Lead EKG Interpretation  Performed by: Concha Se, MD Authorized by: Concha Se, MD     Interpretation: abnormal     ECG rate:  120   ECG rate assessment: tachycardic     Rhythm: sinus tachycardia     Ectopy: none     Conduction: normal      MEDICATIONS ORDERED IN ED: Medications  sodium chloride 0.9 % bolus 1,000 mL (has no administration in time range)  ondansetron (ZOFRAN) injection 4 mg (has no administration in time range)  iohexol (OMNIPAQUE) 350 MG/ML injection 100 mL (100 mLs Intravenous Contrast Given 05/19/22 0758)     IMPRESSION / MDM / ASSESSMENT AND PLAN / ED COURSE  I reviewed the triage vital signs and the  nursing notes.   Patient's presentation is most consistent with acute presentation with potential threat to life or bodily function.   Differential includes lower GI bleed.  No prior history of colonoscopy.  Will get CT imaging to evaluate for active bleed given patient is significantly tachycardic and has a slight drop in hemoglobin although just started prior to arrival.  Differential includes diverticular bleed, infectious colitis, internal hem, lower suspicion for upper bleed.  Pregnancy test was negative.  CMP reassuring.  CBC shows elevated white count of 22,000.  Hemoglobin is dropped from 11.4 down to 10.9.  CT imaging negative for acute findings.  Incidental findings were discussed with patient.  Patient meets sepsis criteria but no clear source at this time so we will hold off on any antibiotics.  We will try to get stool studies given if these were positive this could explain the bleeding if this was like an infectious colitis picture.  Given afebrile continue to monitor we will hold off on blood cultures, lactate given no known source at this time  Discussed with patient given tachycardia and slight drop in hemoglobin without any evidence of external hemorrhoids on examination recommended admission for trending hemoglobins and GI consultation.  Did message Dr. Norma Fredrickson who is aware of the patient.  I discussed with the hospitalist for admission.  At this time do not feel that we need to give any blood transfusions given blood pressure is stable and not a significant amount of blood on rectal exam but will need to be closely monitored.  The patient is on the cardiac monitor to evaluate for evidence of arrhythmia and/or significant heart rate changes.     FINAL CLINICAL IMPRESSION(S) / ED DIAGNOSES   Final diagnoses:  Tachycardia  Lower GI bleed     Rx / DC Orders   ED Discharge Orders     None        Note:  This document was prepared using Dragon voice recognition  software and may include unintentional dictation errors.   Concha Se, MD 05/19/22 1751    Concha Se, MD 05/19/22 [phone]

## 2022-05-19 NOTE — Assessment & Plan Note (Signed)
Continue fluoxetine and hydroxyzine

## 2022-05-19 NOTE — ED Triage Notes (Signed)
Patient ambulatory to triage with steady gait, without difficulty or distress noted; pt reports lower abd pain with some rectal bleeding noted tonight

## 2022-05-19 NOTE — H&P (Signed)
Plan Mr. White count History and Physical    Patient: [name] ZJQ:[id number] DOB: Jan 07, 1986 DOA: 05/19/2022 DOS: the patient was seen and examined on 05/19/2022 PCP: Susy Frizzle, MD  Patient coming from: Home  Chief Complaint:  Chief Complaint  Patient presents with   Abdominal Pain   HPI: [name] is a 36 y.o. female with medical history significant for hiatal hernia, hypertension, anxiety disorder, history of possible IBS (not diagnosed) who presents to the ER for evaluation of rectal bleeding. Patient states that she has had abdominal issues which include alternating constipation, abdominal pain, diarrhea and she was told by her gynecologist while she was pregnant that she may have irritable bowel syndrome. She states that a couple of days ago she had constipation and her eating some prunes and overnight she developed diarrhea containing bright red blood.  She denies having to strain to have a bowel movement and states that she has known hemorrhoids.  She also complains of some discomfort mostly in the lower abdominal area associated with nausea but no vomiting.  She had several more episodes of diarrhea containing blood prompting her visit to the emergency room. She denies having any chest pain, no shortness of breath, no urinary symptoms, no dizziness, no lightheadedness, no headache, no fever, no chills, no cough, no blurred vision or any focal deficit. In the ER she was noted to be tachycardic with heart rate in the 130s and blood pressure was elevated as well Patient attributes that to her known history of anxiety She will be referred to observation status for further evaluation.    Review of Systems: As mentioned in the history of present illness. All other systems reviewed and are negative. Past Medical History:  Diagnosis Date   Anxiety    History of hiatal hernia    Hypertension    with pregnancy. 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 She has never used smokeless tobacco. She reports that she does not drink alcohol and does not use drugs.  Allergies  Allergen Reactions   Amoxicillin Itching    Has patient had a PCN reaction causing immediate rash, facial/tongue/throat swelling, SOB or lightheadedness with hypotension: No Has patient had a PCN reaction causing severe rash involving mucus membranes or skin necrosis: No Has patient had a PCN reaction that required hospitalization: No Has patient had a PCN reaction occurring within the last 10 years: No If all of the above answers are "NO", then may proceed with Cephalosporin use.    Keflex [Cephalexin] Itching    Family History  Problem Relation Age of Onset   Diabetes Mother    Hypertension Mother    Kidney disease Mother    Hypertension Father     Prior to Admission medications   Medication Sig Start Date End Date Taking? Authorizing Provider  acetaminophen (TYLENOL) 500 MG tablet Take 1,000 mg by mouth every 6 (six) hours as needed (for pain).   Yes [provider]  cetirizine (ZYRTEC) 10 MG tablet Take 10 mg by mouth every evening.    Yes [provider]  FLUoxetine (PROZAC) 40 MG capsule Take 1 capsule (40 mg total) by mouth daily. 08/18/21  Yes Malachy Mood, MD  hydrOXYzine (ATARAX) 25 MG tablet Take 1 tablet (25 mg total) by mouth every 6 (six) hours as needed for anxiety. 09/29/21  Yes Malachy Mood, MD  omeprazole (PRILOSEC) 20 MG capsule Take 40 mg by  mouth daily.   Yes [provider]  celecoxib (CELEBREX) 200 MG capsule Take 1 capsule (200 mg total) by mouth 2 (two) times daily. Patient not taking: Reported on 05/19/2022 08/09/21   Billey Co, MD  diphenhydrAMINE (BENADRYL) 25 MG tablet Take 25 mg by mouth every 6 (six) hours as needed. Patient not taking: Reported on 05/19/2022    [provider]  esomeprazole (NEXIUM) 20 MG capsule Take 20 mg by mouth every evening. Patient not taking: Reported on 05/19/2022    [provider]  Prenatal Vit-Fe Fumarate-FA (PRENATAL MULTIVITAMIN) TABS Take 1 tablet by mouth every evening.  Patient not taking: Reported on 08/18/2021    [provider]  traZODone (DESYREL) 50 MG tablet Take 1 tablet (50 mg total) by mouth at bedtime as needed for sleep. Patient not taking: Reported on 05/19/2022 09/29/21   Malachy Mood, MD    Physical Exam: Vitals:   05/19/22 0705 05/19/22 0839 05/19/22 0900 05/19/22 1024  BP: 127/76 132/66 131/89 126/79  Pulse: (!) 116 (!) 118 (!) 114 100  Resp: '18 10 20 18  ' Temp:  98.2 F (36.8 C)  98.7 F (37.1 C)  TempSrc:  Oral  Oral  SpO2: 100% 100% 100% 100%  Weight:      Height:       Physical Exam Vitals and nursing note reviewed.  Constitutional:      Appearance: She is obese.  HENT:     Head: Normocephalic and atraumatic.     Mouth/Throat:     Comments: Dry  Cardiovascular:     Rate and Rhythm: Tachycardia present.  Pulmonary:     Effort: Pulmonary effort is normal.     Breath sounds: Normal breath sounds.  Abdominal:     General: Bowel sounds are normal.     Palpations: Abdomen is soft.     Tenderness: There is abdominal tenderness in the left lower quadrant.  Skin:    General: Skin is warm and dry.  Neurological:     General: No focal deficit present.     Mental Status: She is alert.  Psychiatric:        Mood and Affect: Mood is anxious.     Data Reviewed: Relevant notes from primary care and specialist  visits, past discharge summaries as available in EHR, including Care Everywhere. Prior diagnostic testing as pertinent to current admission diagnoses Updated medications and problem lists for reconciliation ED course, including vitals, labs, imaging, treatment and response to treatment Triage notes, nursing and pharmacy notes and ED provider's notes Notable results as noted in HPI Labs reviewed.  Sodium 137, potassium 3.6, chloride 106, bicarb 21, glucose 133, BUN 11, creatinine 0.81, calcium 9.0, total protein 8.3, albumin 4.2, AST 32, ALT 22, alk phos 114, white count 22, hemoglobin 10.9, hematocrit 36.7, MCV 72, platelet count 389 CT angio of the abdomen and pelvis shows no active bleeding into the gastrointestinal tract. No bowel pathology identified by CT. Evidence of hepatic steatosis. Small hiatal hernia. Twelve-lead EKG reviewed  by me shows sinus tachycardia There are no new results to review at this time.    Assessment and Plan: * Rectal bleeding Patient presents for evaluation of episodes of diarrhea containing bright red blood with some abdominal discomfort. Follow-up stool PCR to rule out an infectious etiology especially since she has a white count of 22,000 which could also be related to her rectal bleeding We will consult GI  Obesity (BMI 30-39.9) BMI 46.19 Complicates overall prognosis and care  Modification and exercise has been discussed with patient in detail  Anxiety Continue fluoxetine and hydroxyzine  Hiatal hernia with GERD Continue PPI      Advance Care Planning:   Code Status: Full Code   Consults: Gastroenterology  Family Communication: Greater than 50% of time was spent discussing plan of care with patient at the bedside.  All questions and concerns have been addressed.  She verbalizes understanding and agrees with the plan.  Severity of Illness: The appropriate patient status for this patient is OBSERVATION. Observation status is judged to be  reasonable and necessary in order to provide the required intensity of service to ensure the patient's safety. The patient's presenting symptoms, physical exam findings, and initial radiographic and laboratory data in the context of their medical condition is felt to place them at decreased risk for further clinical deterioration. Furthermore, it is anticipated that the patient will be medically stable for discharge from the hospital within 2 midnights of admission.   Author: Collier Bullock, MD 05/19/2022 11:09 AM  For on call review [email or web address].

## 2022-05-19 NOTE — Assessment & Plan Note (Signed)
BMI 35.43 Complicates overall prognosis and care  Modification and exercise has been discussed with patient in detail

## 2022-05-19 NOTE — Consult Note (Addendum)
Consultation  Referring Provider:    Dr Joylene Igo  Admit date 05/19/22 Consult date      05/19/22   Reason for Consultation:     rectal bleeding/llq pain         HPI:   [name] is a 36 y.o. female with medical history significant for hiatal hernia, hypertension, anxiety disorder, history of possible IBS who was admitted with lower abodminal pain, rectal bleeding and diarrhea.  States she has a long term GI history- has had GERD since a teen however her daily PPI controls this well when she takes it regularly.  States she was checked for celiac once with conflicting results.  States she has had irregular bowel habits for 9y or more- but over the last 38m has has some problems with increasing irregular bowel habits- describes alternating constipation, abdominal pain, and diarrhea- describes a pattern of constipation for a couple of days followed by a day of several stools that become progressively looser and watery.  Has had some intermittent rectal pain while defecating when she has several stools in a day. Has seen some occasional brpr in small amounts, attributed it to a hemorrhoid. There is nausea with this but no vomiting. Has noted some food triggers including salmon.  She is not taking any fiber supplement or regular stool softeners at this time.  Reports more recently, just prior to this visit,  she had a couple of days of constipation so she ate some prunes- later that night she developed some bloating and cramps- then started moving her bowels- had a formed stool followed by several loose stools- then developed some bloody diarrhea that has stopped since admission.  Still has some lower abdominal cramping but no more diarrhea/ bleeding.  States her mother was throwing up yesterday and feeling unwell- son has also had some diarrhea yesterday.  She denies any history of DM/vascular issues/tobacco/substances. She has never had any colonoscopy. Denies any problems with uveitis/rash. States a  history of chronic anemia of unknown reason.There is some intermittent back pain. States family history significant for ibs but denies any colorectal cancer/colon polyps/IBD/celiac. She denies any medication changes/recent antibiotics. Denies nsaids. She is afebrile and hemodynamically stable. Her hemoglobin is decreased slightly from her baseline onf 11-12. There is some leukocytosis, stool cultures have been ordered. BUN and albumin normal.  CTA of the abdomen and pelvis today with some hepatic steatosis but otherwise unremarkable.  PREVIOUS ENDOSCOPIES:            Egd-? 20y ago in [place] as a teen- states she had a hiatal hernia   Past Medical History:  Diagnosis Date   Anxiety    History of hiatal hernia    Hypertension    with pregnancy. 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    Palpitations    Scoliosis    Trouble in sleeping     Past Surgical History:  Procedure Laterality Date   CESAREAN SECTION N/A 03/04/2013   Procedure: CESAREAN SECTION;  Surgeon: Bing Plume, MD;  Location: [facility];  Service: Obstetrics;  Laterality: N/A;   CESAREAN SECTION N/A 05/09/2018   Procedure: CESAREAN SECTION;  Surgeon: [name], MD;  Location: [facility];  Service: Obstetrics;  Laterality: N/A;    Family History  Problem Relation Age of Onset   Diabetes Mother    Hypertension Mother    Kidney disease Mother    Hypertension Father      Social History  Tobacco Use   Smoking status: Never   Smokeless tobacco: Never  Vaping Use   Vaping Use: Never used  Substance Use Topics   Alcohol use: No   Drug use: No    Prior to Admission medications   Medication Sig Start Date End Date Taking? Authorizing Provider  acetaminophen (TYLENOL) 500 MG tablet Take 1,000 mg by mouth every 6 (six) hours as needed (for pain).   Yes [provider]  cetirizine (ZYRTEC) 10 MG tablet Take 10 mg by mouth every evening.    Yes [provider]  FLUoxetine  (PROZAC) 40 MG capsule Take 1 capsule (40 mg total) by mouth daily. 08/18/21  Yes [name], MD  hydrOXYzine (ATARAX) 25 MG tablet Take 1 tablet (25 mg total) by mouth every 6 (six) hours as needed for anxiety. 09/29/21  Yes [name], MD  omeprazole (PRILOSEC) 20 MG capsule Take 40 mg by mouth daily.   Yes [provider]  celecoxib (CELEBREX) 200 MG capsule Take 1 capsule (200 mg total) by mouth 2 (two) times daily. Patient not taking: Reported on 05/19/2022 08/09/21   Sondra Come, MD  diphenhydrAMINE (BENADRYL) 25 MG tablet Take 25 mg by mouth every 6 (six) hours as needed. Patient not taking: Reported on 05/19/2022    [provider]  esomeprazole (NEXIUM) 20 MG capsule Take 20 mg by mouth every evening. Patient not taking: Reported on 05/19/2022    [provider]  Prenatal Vit-Fe Fumarate-FA (PRENATAL MULTIVITAMIN) TABS Take 1 tablet by mouth every evening.  Patient not taking: Reported on 08/18/2021    [provider]  traZODone (DESYREL) 50 MG tablet Take 1 tablet (50 mg total) by mouth at bedtime as needed for sleep. Patient not taking: Reported on 05/19/2022 09/29/21   [name], MD    No current facility-administered medications for this encounter.    Allergies as of 05/19/2022 - Review Complete 05/19/2022  Allergen Reaction Noted   Amoxicillin Itching 07/26/2012   Keflex [cephalexin] Itching 01/15/2018     Review of Systems:    All systems reviewed and negative except where noted in HPI, with the exception of anxiety.      Physical Exam:  Vital signs in last 24 hours: Temp:  [97.6 F (36.4 C)-98.7 F (37.1 C)] 98.7 F (37.1 C) (09/01 1024) Pulse Rate:  [100-134] 100 (09/01 1024) Resp:  [10-21] 18 (09/01 1024) BP: (126-157)/(66-93) 126/79 (09/01 1024) SpO2:  [100 %] 100 % (09/01 1024) Weight:  [90.7 kg] 90.7 kg (09/01 0548) Last BM Date : 05/19/22 General:   Pleasant woman in NAD Head:  Normocephalic and  atraumatic. Eyes:   No icterus.   Conjunctiva pink. Ears:  Normal auditory acuity. Mouth: Mucosa pink moist, no lesions. Neck:  Supple; no masses felt Lungs:  Respirations even and unlabored. Lungs clear to auscultation bilaterally.   No wheezes, crackles, or rhonchi.  Heart:  S1S2, RRR, no MRG. No edema. Abdomen:   Flat, soft, nondistended, nontender. Normal bowel sounds. No appreciable masses or hepatomegaly. No rebound signs or other peritoneal signs. Rectal:  Not performed.  Msk:  MAEW x4, No clubbing or cyanosis. Strength 5/5. Symmetrical without gross deformities. Neurologic:  Alert and  oriented x4;  Cranial nerves II-XII intact.  Skin:  Warm, dry, pink without significant lesions or rashes. Psych:  Alert and cooperative. Normal affect.  LAB RESULTS: Recent Labs    05/19/22 0549  WBC 22.0*  HGB 10.9*  HCT 36.7  PLT 389  BMET Recent Labs    05/19/22 0549  NA 137  K 3.6  CL 106  CO2 21*  GLUCOSE 133*  BUN 11  CREATININE 0.81  CALCIUM 9.0   LFT Recent Labs    05/19/22 0549  PROT 8.3*  ALBUMIN 4.2  AST 32  ALT 22  ALKPHOS 114  BILITOT 0.6   PT/INR No results for input(s): "LABPROT", "INR" in the last 72 hours.  STUDIES: CT Angio Abd/Pel W and/or Wo Contrast  Result Date: 05/19/2022 CLINICAL DATA:  Rectal bleeding. EXAM: CT ANGIOGRAPHY ABDOMEN AND PELVIS WITH CONTRAST AND WITHOUT CONTRAST TECHNIQUE: Multidetector CT imaging of the abdomen and pelvis was performed using the standard protocol during bolus administration of intravenous contrast. Multiplanar reconstructed images and MIPs were obtained and reviewed to evaluate the vascular anatomy. RADIATION DOSE REDUCTION: This exam was performed according to the departmental dose-optimization program which includes automated exposure control, adjustment of the mA and/or kV according to patient size and/or use of iterative reconstruction technique. CONTRAST:  OMNIPAQUE IOHEXOL 350 MG/ML SOLN COMPARISON:  None  Available. FINDINGS: VASCULAR Aorta: Normally patent. No evidence of atherosclerosis, aneurysm or dissection. Celiac: Normally patent. Normal branching anatomy and branch vessel patency. SMA: Normally patent. 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 IMA: Normally patent. Inflow: Normally patent bilateral iliac arteries. Proximal Outflow: Normally patent bilateral common femoral arteries and femoral bifurcations. Veins: Venous phase imaging demonstrates normal patency of venous structures in the abdomen and pelvis. Review of the MIP images confirms the above findings. NON-VASCULAR Lower chest: No acute abnormality.  Small hiatal hernia. Hepatobiliary: Evidence of hepatic steatosis. Normal appearance of gallbladder. No biliary ductal dilatation. Pancreas: Unremarkable. No pancreatic ductal dilatation or surrounding inflammatory changes. Spleen: Normal in size without focal abnormality. Adrenals/Urinary Tract: Adrenal glands are unremarkable. Kidneys are normal, without renal calculi, focal lesion, or hydronephrosis. Bladder is unremarkable. Stomach/Bowel: Bowel shows no evidence of obstruction, ileus, inflammation or lesion. The appendix is normal. No free intraperitoneal air. No evidence of active bleeding into the gastrointestinal tract by CTA. Lymphatic: No enlarged abdominal or pelvic lymph nodes. Reproductive: Uterus and bilateral adnexa are unremarkable. Other: No abdominal wall hernia or abnormality. No abdominopelvic ascites. Musculoskeletal: No acute or significant osseous findings. IMPRESSION: 1. No active bleeding into the gastrointestinal tract. No bowel pathology identified by CT. 2. Evidence of hepatic steatosis. 3. Small hiatal hernia. Electronically Signed   By: Irish Lack M.D.   On: 05/19/2022 08:32       Impression / Plan:   Rectal bleeding/llq pain/irregular bowel habits- I think it is likely  she has an acute gastroenteritis that is resolving (given her reported history) with IBSC with fecal catharsis and anal outlet issues however patient has some chronic anemia and reports a confusing celiac test some years ago. DDx includes proctitis/IBD, less likely celiac/colon ischemia/malignancy. We discussed her care plan for some time. For now would like to see a fecal calprotectin and celiac panel. Recommending daily fiber like psyllium and daily stool softener like colace.  I do thinks she should have a colonsocopy for luminal evaluation soon- +/- with egd pending celiac test results. States she would like to do this, did also advise to continue follow up as o/p. Will discuss with Dr Norma Fredrickson  Thank you very much for this consult. These services were provided by Vevelyn Pat, NP-C, in collaboration with Stanton Kidney, MD, with whom I have discussed this  patient in full.   Addendum- in further consideration and discussion with Dr Norma Fredrickson- will plan for egd/colonoscopy tomorrow for her history of gerd/duodenal biopsies/abdominal pain/rectal bleeding/irregular bowel habits. This was discussed with patient- indications/benefits/risks and she says she would like to proceed.  Vevelyn Pat, NP-C

## 2022-05-19 NOTE — H&P (View-Only) (Signed)
Consultation  Referring Provider:    Dr Joylene Igo  Admit date 05/19/22 Consult date      05/19/22   Reason for Consultation:     rectal bleeding/llq pain         HPI:   [name] is a 36 y.o. female with medical history significant for hiatal hernia, hypertension, anxiety disorder, history of possible IBS who was admitted with lower abodminal pain, rectal bleeding and diarrhea.  States she has a long term GI history- has had GERD since a teen however her daily PPI controls this well when she takes it regularly.  States she was checked for celiac once with conflicting results.  States she has had irregular bowel habits for 9y or more- but over the last 38m has has some problems with increasing irregular bowel habits- describes alternating constipation, abdominal pain, and diarrhea- describes a pattern of constipation for a couple of days followed by a day of several stools that become progressively looser and watery.  Has had some intermittent rectal pain while defecating when she has several stools in a day. Has seen some occasional brpr in small amounts, attributed it to a hemorrhoid. There is nausea with this but no vomiting. Has noted some food triggers including salmon.  She is not taking any fiber supplement or regular stool softeners at this time.  Reports more recently, just prior to this visit,  she had a couple of days of constipation so she ate some prunes- later that night she developed some bloating and cramps- then started moving her bowels- had a formed stool followed by several loose stools- then developed some bloody diarrhea that has stopped since admission.  Still has some lower abdominal cramping but no more diarrhea/ bleeding.  States her mother was throwing up yesterday and feeling unwell- son has also had some diarrhea yesterday.  She denies any history of DM/vascular issues/tobacco/substances. She has never had any colonoscopy. Denies any problems with uveitis/rash. States a  history of chronic anemia of unknown reason.There is some intermittent back pain. States family history significant for ibs but denies any colorectal cancer/colon polyps/IBD/celiac. She denies any medication changes/recent antibiotics. Denies nsaids. She is afebrile and hemodynamically stable. Her hemoglobin is decreased slightly from her baseline onf 11-12. There is some leukocytosis, stool cultures have been ordered. BUN and albumin normal.  CTA of the abdomen and pelvis today with some hepatic steatosis but otherwise unremarkable.  PREVIOUS ENDOSCOPIES:            Egd-? 20y ago in [place] as a teen- states she had a hiatal hernia   Past Medical History:  Diagnosis Date   Anxiety    History of hiatal hernia    Hypertension    with pregnancy. 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    Palpitations    Scoliosis    Trouble in sleeping     Past Surgical History:  Procedure Laterality Date   CESAREAN SECTION N/A 03/04/2013   Procedure: CESAREAN SECTION;  Surgeon: Bing Plume, MD;  Location: [facility];  Service: Obstetrics;  Laterality: N/A;   CESAREAN SECTION N/A 05/09/2018   Procedure: CESAREAN SECTION;  Surgeon: [name], MD;  Location: [facility];  Service: Obstetrics;  Laterality: N/A;    Family History  Problem Relation Age of Onset   Diabetes Mother    Hypertension Mother    Kidney disease Mother    Hypertension Father      Social History  Tobacco Use   Smoking status: Never   Smokeless tobacco: Never  Vaping Use   Vaping Use: Never used  Substance Use Topics   Alcohol use: No   Drug use: No    Prior to Admission medications   Medication Sig Start Date End Date Taking? Authorizing Provider  acetaminophen (TYLENOL) 500 MG tablet Take 1,000 mg by mouth every 6 (six) hours as needed (for pain).   Yes [provider]  cetirizine (ZYRTEC) 10 MG tablet Take 10 mg by mouth every evening.    Yes [provider]  FLUoxetine  (PROZAC) 40 MG capsule Take 1 capsule (40 mg total) by mouth daily. 08/18/21  Yes [name], MD  hydrOXYzine (ATARAX) 25 MG tablet Take 1 tablet (25 mg total) by mouth every 6 (six) hours as needed for anxiety. 09/29/21  Yes [name], MD  omeprazole (PRILOSEC) 20 MG capsule Take 40 mg by mouth daily.   Yes [provider]  celecoxib (CELEBREX) 200 MG capsule Take 1 capsule (200 mg total) by mouth 2 (two) times daily. Patient not taking: Reported on 05/19/2022 08/09/21   Sondra Come, MD  diphenhydrAMINE (BENADRYL) 25 MG tablet Take 25 mg by mouth every 6 (six) hours as needed. Patient not taking: Reported on 05/19/2022    [provider]  esomeprazole (NEXIUM) 20 MG capsule Take 20 mg by mouth every evening. Patient not taking: Reported on 05/19/2022    [provider]  Prenatal Vit-Fe Fumarate-FA (PRENATAL MULTIVITAMIN) TABS Take 1 tablet by mouth every evening.  Patient not taking: Reported on 08/18/2021    [provider]  traZODone (DESYREL) 50 MG tablet Take 1 tablet (50 mg total) by mouth at bedtime as needed for sleep. Patient not taking: Reported on 05/19/2022 09/29/21   [name], MD    No current facility-administered medications for this encounter.    Allergies as of 05/19/2022 - Review Complete 05/19/2022  Allergen Reaction Noted   Amoxicillin Itching 07/26/2012   Keflex [cephalexin] Itching 01/15/2018     Review of Systems:    All systems reviewed and negative except where noted in HPI, with the exception of anxiety.      Physical Exam:  Vital signs in last 24 hours: Temp:  [97.6 F (36.4 C)-98.7 F (37.1 C)] 98.7 F (37.1 C) (09/01 1024) Pulse Rate:  [100-134] 100 (09/01 1024) Resp:  [10-21] 18 (09/01 1024) BP: (126-157)/(66-93) 126/79 (09/01 1024) SpO2:  [100 %] 100 % (09/01 1024) Weight:  [90.7 kg] 90.7 kg (09/01 0548) Last BM Date : 05/19/22 General:   Pleasant woman in NAD Head:  Normocephalic and  atraumatic. Eyes:   No icterus.   Conjunctiva pink. Ears:  Normal auditory acuity. Mouth: Mucosa pink moist, no lesions. Neck:  Supple; no masses felt Lungs:  Respirations even and unlabored. Lungs clear to auscultation bilaterally.   No wheezes, crackles, or rhonchi.  Heart:  S1S2, RRR, no MRG. No edema. Abdomen:   Flat, soft, nondistended, nontender. Normal bowel sounds. No appreciable masses or hepatomegaly. No rebound signs or other peritoneal signs. Rectal:  Not performed.  Msk:  MAEW x4, No clubbing or cyanosis. Strength 5/5. Symmetrical without gross deformities. Neurologic:  Alert and  oriented x4;  Cranial nerves II-XII intact.  Skin:  Warm, dry, pink without significant lesions or rashes. Psych:  Alert and cooperative. Normal affect.  LAB RESULTS: Recent Labs    05/19/22 0549  WBC 22.0*  HGB 10.9*  HCT 36.7  PLT 389  BMET Recent Labs    05/19/22 0549  NA 137  K 3.6  CL 106  CO2 21*  GLUCOSE 133*  BUN 11  CREATININE 0.81  CALCIUM 9.0   LFT Recent Labs    05/19/22 0549  PROT 8.3*  ALBUMIN 4.2  AST 32  ALT 22  ALKPHOS 114  BILITOT 0.6   PT/INR No results for input(s): "LABPROT", "INR" in the last 72 hours.  STUDIES: CT Angio Abd/Pel W and/or Wo Contrast  Result Date: 05/19/2022 CLINICAL DATA:  Rectal bleeding. EXAM: CT ANGIOGRAPHY ABDOMEN AND PELVIS WITH CONTRAST AND WITHOUT CONTRAST TECHNIQUE: Multidetector CT imaging of the abdomen and pelvis was performed using the standard protocol during bolus administration of intravenous contrast. Multiplanar reconstructed images and MIPs were obtained and reviewed to evaluate the vascular anatomy. RADIATION DOSE REDUCTION: This exam was performed according to the departmental dose-optimization program which includes automated exposure control, adjustment of the mA and/or kV according to patient size and/or use of iterative reconstruction technique. CONTRAST:  OMNIPAQUE IOHEXOL 350 MG/ML SOLN COMPARISON:  None  Available. FINDINGS: VASCULAR Aorta: Normally patent. No evidence of atherosclerosis, aneurysm or dissection. Celiac: Normally patent. Normal branching anatomy and branch vessel patency. SMA: Normally patent. 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 IMA: Normally patent. Inflow: Normally patent bilateral iliac arteries. Proximal Outflow: Normally patent bilateral common femoral arteries and femoral bifurcations. Veins: Venous phase imaging demonstrates normal patency of venous structures in the abdomen and pelvis. Review of the MIP images confirms the above findings. NON-VASCULAR Lower chest: No acute abnormality.  Small hiatal hernia. Hepatobiliary: Evidence of hepatic steatosis. Normal appearance of gallbladder. No biliary ductal dilatation. Pancreas: Unremarkable. No pancreatic ductal dilatation or surrounding inflammatory changes. Spleen: Normal in size without focal abnormality. Adrenals/Urinary Tract: Adrenal glands are unremarkable. Kidneys are normal, without renal calculi, focal lesion, or hydronephrosis. Bladder is unremarkable. Stomach/Bowel: Bowel shows no evidence of obstruction, ileus, inflammation or lesion. The appendix is normal. No free intraperitoneal air. No evidence of active bleeding into the gastrointestinal tract by CTA. Lymphatic: No enlarged abdominal or pelvic lymph nodes. Reproductive: Uterus and bilateral adnexa are unremarkable. Other: No abdominal wall hernia or abnormality. No abdominopelvic ascites. Musculoskeletal: No acute or significant osseous findings. IMPRESSION: 1. No active bleeding into the gastrointestinal tract. No bowel pathology identified by CT. 2. Evidence of hepatic steatosis. 3. Small hiatal hernia. Electronically Signed   By: Irish Lack M.D.   On: 05/19/2022 08:32       Impression / Plan:   Rectal bleeding/llq pain/irregular bowel habits- I think it is likely  she has an acute gastroenteritis that is resolving (given her reported history) with IBSC with fecal catharsis and anal outlet issues however patient has some chronic anemia and reports a confusing celiac test some years ago. DDx includes proctitis/IBD, less likely celiac/colon ischemia/malignancy. We discussed her care plan for some time. For now would like to see a fecal calprotectin and celiac panel. Recommending daily fiber like psyllium and daily stool softener like colace.  I do thinks she should have a colonsocopy for luminal evaluation soon- +/- with egd pending celiac test results. States she would like to do this, did also advise to continue follow up as o/p. Will discuss with Dr Norma Fredrickson  Thank you very much for this consult. These services were provided by Vevelyn Pat, NP-C, in collaboration with Stanton Kidney, MD, with whom I have discussed this  patient in full.   Addendum- in further consideration and discussion with Dr Norma Fredrickson- will plan for egd/colonoscopy tomorrow for her history of gerd/duodenal biopsies/abdominal pain/rectal bleeding/irregular bowel habits. This was discussed with patient- indications/benefits/risks and she says she would like to proceed.  Vevelyn Pat, NP-C

## 2022-05-19 NOTE — Assessment & Plan Note (Signed)
Patient presents for evaluation of episodes of diarrhea containing bright red blood with some abdominal discomfort. Follow-up stool PCR to rule out an infectious etiology especially since she has a white count of 22,000 which could also be related to her rectal bleeding We will consult GI

## 2022-05-19 NOTE — Assessment & Plan Note (Signed)
Continue PPI ?

## 2022-05-20 ENCOUNTER — Encounter: Payer: Self-pay | Admitting: Internal Medicine

## 2022-05-20 ENCOUNTER — Observation Stay: Payer: Managed Care, Other (non HMO) | Admitting: Anesthesiology

## 2022-05-20 ENCOUNTER — Encounter
Admission: EM | Disposition: A | Discharge status: Home / Self Care | Payer: Self-pay | Source: Home / Self Care | Attending: Emergency Medicine

## 2022-05-20 DIAGNOSIS — K529 Noninfective gastroenteritis and colitis, unspecified: Secondary | ICD-10-CM | POA: Diagnosis present

## 2022-05-20 DIAGNOSIS — K625 Hemorrhage of anus and rectum: Secondary | ICD-10-CM | POA: Diagnosis not present

## 2022-05-20 DIAGNOSIS — D72825 Bandemia: Secondary | ICD-10-CM | POA: Diagnosis not present

## 2022-05-20 DIAGNOSIS — D72829 Elevated white blood cell count, unspecified: Secondary | ICD-10-CM | POA: Diagnosis present

## 2022-05-20 HISTORY — PX: ESOPHAGOGASTRODUODENOSCOPY: SHX5428

## 2022-05-20 HISTORY — PX: COLONOSCOPY: SHX5424

## 2022-05-20 LAB — TYPE AND SCREEN
ABO/RH(D): O POS
Antibody Screen: NEGATIVE

## 2022-05-20 LAB — CBC WITH DIFFERENTIAL/PLATELET
Abs Immature Granulocytes: 0.03 10*3/uL (ref 0.00–0.07)
Basophils Absolute: 0 10*3/uL (ref 0.0–0.1)
Basophils Relative: 0 %
Eosinophils Absolute: 0.1 10*3/uL (ref 0.0–0.5)
Eosinophils Relative: 1 %
HCT: 32.9 % — ABNORMAL LOW (ref 36.0–46.0)
Hemoglobin: 9.9 g/dL — ABNORMAL LOW (ref 12.0–15.0)
Immature Granulocytes: 0 %
Lymphocytes Relative: 21 %
Lymphs Abs: 1.8 10*3/uL (ref 0.7–4.0)
MCH: 21.6 pg — ABNORMAL LOW (ref 26.0–34.0)
MCHC: 30.1 g/dL (ref 30.0–36.0)
MCV: 71.8 fL — ABNORMAL LOW (ref 80.0–100.0)
Monocytes Absolute: 0.7 10*3/uL (ref 0.1–1.0)
Monocytes Relative: 8 %
Neutro Abs: 6 10*3/uL (ref 1.7–7.7)
Neutrophils Relative %: 70 %
Platelets: 366 10*3/uL (ref 150–400)
RBC: 4.58 MIL/uL (ref 3.87–5.11)
RDW: 16 % — ABNORMAL HIGH (ref 11.5–15.5)
WBC: 8.6 10*3/uL (ref 4.0–10.5)
nRBC: 0 % (ref 0.0–0.2)

## 2022-05-20 LAB — GASTROINTESTINAL PANEL BY PCR, STOOL (REPLACES STOOL CULTURE)

## 2022-05-20 LAB — HEMOGLOBIN AND HEMATOCRIT, BLOOD
HCT: 32.2 % — ABNORMAL LOW (ref 36.0–46.0)
HCT: 31.9 % — ABNORMAL LOW (ref 36.0–46.0)
Hemoglobin: 9.8 g/dL — ABNORMAL LOW (ref 12.0–15.0)
Hemoglobin: 9.6 g/dL — ABNORMAL LOW (ref 12.0–15.0)

## 2022-05-20 SURGERY — EGD (ESOPHAGOGASTRODUODENOSCOPY)
Anesthesia: General

## 2022-05-20 MED ORDER — LIDOCAINE HCL (CARDIAC) PF 100 MG/5ML IV SOSY
PREFILLED_SYRINGE | INTRAVENOUS | Status: DC | PRN
  Administered 2022-05-20: 60 mg via INTRAVENOUS

## 2022-05-20 MED ORDER — PROPOFOL 10 MG/ML IV BOLUS
INTRAVENOUS | Status: AC
  Filled 2022-05-20: qty 20

## 2022-05-20 MED ORDER — PROPOFOL 10 MG/ML IV BOLUS
INTRAVENOUS | Status: DC | PRN
  Administered 2022-05-20: 60 mg via INTRAVENOUS
  Administered 2022-05-20: 50 mg via INTRAVENOUS
  Administered 2022-05-20: 40 mg via INTRAVENOUS

## 2022-05-20 MED ORDER — MIDAZOLAM HCL 2 MG/2ML IJ SOLN
INTRAMUSCULAR | Status: DC | PRN
  Administered 2022-05-20 (×2): 2 mg via INTRAVENOUS

## 2022-05-20 MED ORDER — PROPOFOL 500 MG/50ML IV EMUL
INTRAVENOUS | Status: DC | PRN
  Administered 2022-05-20: 150 ug/kg/min via INTRAVENOUS

## 2022-05-20 MED ORDER — SODIUM CHLORIDE 0.9 % IV SOLN: INTRAVENOUS | Status: DC

## 2022-05-20 MED ORDER — MIDAZOLAM HCL 2 MG/2ML IJ SOLN
INTRAMUSCULAR | Status: AC
  Filled 2022-05-20: qty 2

## 2022-05-20 MED ORDER — DEXMEDETOMIDINE (PRECEDEX) IN NS 20 MCG/5ML (4 MCG/ML) IV SYRINGE
PREFILLED_SYRINGE | INTRAVENOUS | Status: DC | PRN
  Administered 2022-05-20: 4 ug via INTRAVENOUS
  Administered 2022-05-20: 8 ug via INTRAVENOUS

## 2022-05-20 NOTE — Anesthesia Procedure Notes (Signed)
Procedure Name: MAC Date/Time: 05/20/2022 9:36 AM  Performed by: Jerrye Noble, CRNAPre-anesthesia Checklist: Patient identified, Emergency Drugs available, Suction available and Patient being monitored Patient Re-evaluated:Patient Re-evaluated prior to induction Oxygen Delivery Method: Nasal cannula

## 2022-05-20 NOTE — Op Note (Signed)
[facility] Gastroenterology Patient Name: [name] Procedure Date: 05/20/2022 9:35 AM MRN: [id number] Account #: [id number] Date of Birth: 1986-06-12 Admit Type: Inpatient Age: 36 Room: [facility] ENDO ROOM 4 Gender: Female Note Status: Finalized Instrument Name: Prentice Docker [id number] Procedure:             Colonoscopy Indications:           Generalized abdominal pain, Diarrhea, Hematochezia Providers:             Boykin Nearing. Norma Fredrickson MD, MD Referring MD:          Priscille Heidelberg. Pickard (Referring MD) Medicines:             Propofol per Anesthesia Complications:         No immediate complications. Procedure:             Pre-Anesthesia Assessment:                        - The risks and benefits of the procedure and the                         sedation options and risks were discussed with the                         patient. All questions were answered and informed                         consent was obtained.                        - Patient identification and proposed procedure were                         verified prior to the procedure by the nurse. The                         procedure was verified in the procedure room.                        - ASA Grade Assessment: III - A patient with severe                         systemic disease.                        - After reviewing the risks and benefits, the patient                         was deemed in satisfactory condition to undergo the                         procedure.                        After obtaining informed consent, the colonoscope was                         passed under direct vision. Throughout the procedure,                         the  patient's blood pressure, pulse, and oxygen                         saturations were monitored continuously. The                         Colonoscope was introduced through the anus and                         advanced to the the terminal ileum, with                          identification of the appendiceal orifice and IC                         valve. The colonoscopy was performed without                         difficulty. The patient tolerated the procedure well.                         The quality of the bowel preparation was good. The                         terminal ileum, ileocecal valve, appendiceal orifice,                         and rectum were photographed. Findings:      The perianal and digital rectal examinations were normal. Pertinent       negatives include normal sphincter tone and no palpable rectal lesions.      Patchy severe inflammation characterized by erythema, friability and       serpentine ulcerations was found in the sigmoid colon. Biopsies were       taken with a cold forceps for histology.      The terminal ileum appeared normal. Biopsies were taken with a cold       forceps for histology.      The transverse colon, ascending colon and cecum appeared normal. Four       biopsies were obtained with cold forceps for histology right colon.      The rectum appeared normal. Impression:            - Patchy severe inflammation was found in the sigmoid                         colon secondary to left-sided colitis. Biopsied.                        - The examined portion of the ileum was normal.                         Biopsied.                        - The transverse colon, ascending colon and cecum are                         normal.                        -  The rectum is normal.                        - Biopsies performed right colon. Recommendation:        - Await pathology results from EGD, also performed                         today.                        - Return patient to hospital ward for ongoing care.                        - Await pathology results.                        - Mechanical soft diet.                        - GI service will continue to follow the patient's                         progress and closely observe  for any changes in                         clinical status. Procedure Code(s):     --- Professional ---                        [phone], Colonoscopy, flexible; with biopsy, single or                         multiple Diagnosis Code(s):     --- Professional ---                        K92.1, Melena (includes Hematochezia)                        R19.7, Diarrhea, unspecified                        R10.84, Generalized abdominal pain                        K51.50, Left sided colitis without complications CPT copyright 2019 [employer]. All rights reserved. The codes documented in this report are preliminary and upon coder review may  be revised to meet current compliance requirements. Stanton Kidney MD, MD 05/20/2022 10:21:48 AM This report has been signed electronically. Number of Addenda: 0 Note Initiated On: 05/20/2022 9:35 AM Scope Withdrawal Time: 0 hours 7 minutes 13 seconds  Total Procedure Duration: 0 hours 13 minutes 8 seconds  Estimated Blood Loss:  Estimated blood loss was minimal.      [facility]

## 2022-05-20 NOTE — Anesthesia Preprocedure Evaluation (Addendum)
Anesthesia Evaluation  Patient identified by MRN, date of birth, ID band Patient awake    Reviewed: Allergy & Precautions, NPO status , Patient's Chart, lab work & pertinent test results  Airway Mallampati: III  TM Distance: >3 FB Neck ROM: full    Dental no notable dental hx.    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 No date: Palpitations No date: Scoliosis No date: Trouble in sleeping  Past Surgical History: 03/04/2013: CESAREAN SECTION; N/A     Comment:  Procedure: CESAREAN SECTION;  Surgeon: Bing Plume,               MD;  Location: [facility];  Service: Obstetrics;  Laterality:              N/A; 05/09/2018: CESAREAN SECTION; N/A     Comment:  Procedure: CESAREAN SECTION;  Surgeon: [name], MD;  Location: [facility] ORS;  Service: Obstetrics;                Laterality: N/A;  BMI    Body Mass Index: 35.43 kg/m      Reproductive/Obstetrics negative OB ROS                            Anesthesia Physical Anesthesia Plan  ASA: 2  Anesthesia Plan: General   Post-op Pain Management: Minimal or no pain anticipated   Induction: Intravenous  PONV  Risk Score and Plan: Propofol infusion and TIVA  Airway Management Planned: Natural Airway  Additional Equipment:   Intra-op Plan:   Post-operative Plan:   Informed Consent: I have reviewed the patients History and Physical, chart, labs and discussed the procedure including the risks, benefits and alternatives for the proposed anesthesia with the patient or authorized representative who has indicated his/her understanding and acceptance.     Dental Advisory Given  Plan Discussed with: Anesthesiologist, CRNA and Surgeon  Anesthesia Plan Comments:        Anesthesia Quick Evaluation

## 2022-05-20 NOTE — Transfer of Care (Signed)
Immediate Anesthesia Transfer of Care Note  Patient: [name]  Procedure(s) Performed: ESOPHAGOGASTRODUODENOSCOPY (EGD) COLONOSCOPY  Patient Location: PACU  Anesthesia Type:General  Level of Consciousness: drowsy and patient cooperative  Airway & Oxygen Therapy: Patient Spontanous Breathing and Patient connected to nasal cannula oxygen  Post-op Assessment: Report given to RN and Post -op Vital signs reviewed and stable  Post vital signs: Reviewed and stable  Last Vitals:  Vitals Value Taken Time  BP 132/79 05/20/22 1015  Temp 36.6 C 05/20/22 1015  Pulse 88 05/20/22 1021  Resp 16 05/20/22 1021  SpO2 100 % 05/20/22 1021  Vitals shown include unvalidated device data.  Last Pain:  Vitals:   05/20/22 1015  TempSrc:   PainSc: 0-No pain         Complications: No notable events documented.

## 2022-05-20 NOTE — Anesthesia Postprocedure Evaluation (Signed)
Anesthesia Post Note  Patient: [name]  Procedure(s) Performed: ESOPHAGOGASTRODUODENOSCOPY (EGD) COLONOSCOPY  Patient location during evaluation: PACU Anesthesia Type: General Level of consciousness: awake and alert Pain management: pain level controlled Vital Signs Assessment: post-procedure vital signs reviewed and stable Respiratory status: spontaneous breathing, nonlabored ventilation and respiratory function stable Cardiovascular status: blood pressure returned to baseline and stable Postop Assessment: no apparent nausea or vomiting Anesthetic complications: no   No notable events documented.   Last Vitals:  Vitals:   05/20/22 1559 05/20/22 2002  BP: 129/80 (!) 143/91  Pulse: 99 95  Resp: 18 16  Temp: 36.9 C 36.9 C  SpO2: 100% 100%    Last Pain:  Vitals:   05/20/22 2002  TempSrc: Oral  PainSc: 0-No pain                 Foye Deer

## 2022-05-20 NOTE — Op Note (Signed)
[facility] Gastroenterology Patient Name: [name] Procedure Date: 05/20/2022 9:36 AM MRN: [id number] Account #: [id number] Date of Birth: [birth year] Admit Type: Inpatient Age: 36 Room: [facility] ENDO ROOM 4 Gender: Female Note Status: Finalized Instrument Name: Upper Endoscope [id number] Procedure:             Upper GI endoscopy Indications:           Generalized abdominal pain, Diarrhea, Endoscopy to                         assess diarrhea in patient suspected of having disease                         of the small-bowel Providers:             Benay Pike. Alice Reichert MD, MD Referring MD:          Cammie Mcgee. Pickard (Referring MD) Medicines:             Propofol per Anesthesia Complications:         No immediate complications. Procedure:             Pre-Anesthesia Assessment:                        - The risks and benefits of the procedure and the                         sedation options and risks were discussed with the                         patient. All questions were answered and informed                         consent was obtained.                        - Patient identification and proposed procedure were                         verified prior to the procedure by the nurse. The                         procedure was verified in the procedure room.                        - ASA Grade Assessment: III - A patient with severe                         systemic disease.                        - After reviewing the risks and benefits, the patient                         was deemed in satisfactory condition to undergo the                         procedure.  After obtaining informed consent, the endoscope was                         passed under direct vision. Throughout the procedure,                         the patient's blood pressure, pulse, and oxygen                         saturations were monitored continuously. The Endoscope                          was introduced through the mouth, and advanced to the                         third part of duodenum. The upper GI endoscopy was                         accomplished without difficulty. The patient tolerated                         the procedure well. Findings:      The esophagus was normal.      A 1 cm hiatal hernia was present.      The exam of the stomach was otherwise normal.      The examined duodenum was normal. Biopsies for histology were taken with       a cold forceps for evaluation of celiac disease.      The exam was otherwise without abnormality. Impression:            - Normal esophagus.                        - 1 cm hiatal hernia.                        - Normal examined duodenum. Biopsied.                        - The examination was otherwise normal. Recommendation:        - Await pathology results.                        - Proceed with colonoscopy Procedure Code(s):     --- Professional ---                        [phone], Esophagogastroduodenoscopy, flexible,                         transoral; with biopsy, single or multiple Diagnosis Code(s):     --- Professional ---                        R19.7, Diarrhea, unspecified                        R10.84, Generalized abdominal pain                        K44.9, Diaphragmatic hernia without obstruction or  gangrene CPT copyright 2019 American Medical Association. All rights reserved. The codes documented in this report are preliminary and upon coder review may  be revised to meet current compliance requirements. Stanton Kidney MD, MD 05/20/2022 9:48:31 AM This report has been signed electronically. Number of Addenda: 0 Note Initiated On: 05/20/2022 9:36 AM Estimated Blood Loss:  Estimated blood loss: none.      [facility]

## 2022-05-20 NOTE — Interval H&P Note (Signed)
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  The various methods of treatment have been discussed with the patient and family. After consideration of risks, benefits and other options for treatment, the patient has consented to  Procedure(s): ESOPHAGOGASTRODUODENOSCOPY (EGD) (N/A) COLONOSCOPY (N/A) as a surgical intervention.  The patient's history has been reviewed, patient examined, no change in status, stable for surgery.  I have reviewed the patient's chart and labs.  Questions were answered to the patient's satisfaction.     [name], Jenna

## 2022-05-20 NOTE — Interval H&P Note (Signed)
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  The various methods of treatment have been discussed with the patient and family. After consideration of risks, benefits and other options for treatment, the patient has consented to  Procedure(s): ESOPHAGOGASTRODUODENOSCOPY (EGD) (N/A) COLONOSCOPY (N/A) as a surgical intervention.  The patient's history has been reviewed, patient examined, no change in status, stable for surgery.  I have reviewed the patient's chart and labs.  Questions were answered to the patient's satisfaction.     Contina Strain   

## 2022-05-20 NOTE — Progress Notes (Addendum)
Progress Note    [name]  KVQ:[id number] DOB: 1985/11/01  DOA: 05/19/2022 PCP: Donita Brooks, MD      Brief Narrative:    Medical records reviewed and are as summarized below:  [name] is a 36 y.o. female with medical history significant for hiatal hernia, hypertension, anxiety disorder, history of possible IBS (not diagnosed), who presented to the hospital because of rectal bleeding, diarrhea and abdominal pain        Assessment/Plan:   Principal Problem:   Rectal bleeding Active Problems:   Hiatal hernia with GERD   Anxiety   Obesity (BMI 30-39.9)   Acute colitis   Leukocytosis    Body mass index is 35.43 kg/m.  (Obesity)  Rectal bleeding, acute colitis: S/p EGD and colonoscopy which showed severe left-sided colitis.  Continue soft diet.  Case discussed with Dr. Norma Fredrickson, gastroenterologist.  No antibiotics for now.  Leukocytosis has resolved.  Stool for C. difficile toxin was negative.  Repeat CBC tomorrow  Hiatal hernia, GERD: Continue Protonix  Other comorbidities include anxiety, sinus headaches  Diet Order             DIET SOFT Room service appropriate? Yes; Fluid consistency: Thin  Diet effective now                            Consultants: Gastroenterologist  Procedures: EGD and colonoscopy    Medications:    FLUoxetine  40 mg Oral QHS   pantoprazole  40 mg Oral Daily   Continuous Infusions:   Anti-infectives (From admission, onward)    None              Family Communication/Anticipated D/C date and plan/Code Status   DVT prophylaxis: SCDs Start: 05/19/22 1105     Code Status: Full Code  Family Communication: None Disposition Plan: Plan to discharge home tomorrow   Status is: Observation The patient will require care spanning > 2 midnights and should be moved to inpatient because: Needs to be monitored overnight after severe colitis was found on colonoscopy       Subjective:    Interval events noted.  No rectal bleeding or abdominal pain.  She feels better.  Objective:    Vitals:   05/20/22 1025 05/20/22 1030 05/20/22 1040 05/20/22 1559  BP: 119/75 119/75 129/80 129/80  Pulse: 89 95 96 99  Resp: 18 15 14 18   Temp:   99.1 F (37.3 C) 98.5 F (36.9 C)  TempSrc:    Oral  SpO2: 99% 99% 98% 100%  Weight:      Height:       No data found.   Intake/Output Summary (Last 24 hours) at 05/20/2022 1626 Last data filed at 05/20/2022 1402 Gross per 24 hour  Intake 629.67 ml  Output --  Net 629.67 ml   Filed Weights   05/19/22 0548  Weight: 90.7 kg    Exam:  GEN: NAD SKIN: No rash EYES: EOMI ENT: MMM CV: RRR PULM: CTA B ABD: soft, obese, NT, +BS CNS: AAO x 3, non focal EXT: No edema or tenderness        Data Reviewed:   I have personally reviewed following labs and imaging studies:  Labs: Labs show the following:   Basic Metabolic Panel: Recent Labs  Lab 05/19/22 0549  NA 137  K 3.6  CL 106  CO2 21*  GLUCOSE 133*  BUN 11  CREATININE 0.81  CALCIUM  9.0   GFR Estimated Creatinine Clearance: 102.6 mL/min (by C-G formula based on SCr of 0.81 mg/dL). Liver Function Tests: Recent Labs  Lab 05/19/22 0549  AST 32  ALT 22  ALKPHOS 114  BILITOT 0.6  PROT 8.3*  ALBUMIN 4.2   Recent Labs  Lab 05/19/22 0549  LIPASE 30   No results for input(s): "AMMONIA" in the last 168 hours. Coagulation profile No results for input(s): "INR", "PROTIME" in the last 168 hours.  CBC: Recent Labs  Lab 05/19/22 0549 05/19/22 1400 05/19/22 2150 05/20/22 0533 05/20/22 1348  WBC 22.0*  --   --  8.6  --   NEUTROABS 20.3*  --   --  6.0  --   HGB 10.9* 10.1* 9.8* 9.9*  9.8* 9.6*  HCT 36.7 33.0* 32.2* 32.9*  32.2* 31.9*  MCV 72.2*  --   --  71.8*  --   PLT 389  --   --  366  --    Cardiac Enzymes: No results for input(s): "CKTOTAL", "CKMB", "CKMBINDEX", "TROPONINI" in the last 168 hours. BNP (last 3 results) No results for input(s):  "PROBNP" in the last 8760 hours. CBG: No results for input(s): "GLUCAP" in the last 168 hours. D-Dimer: No results for input(s): "DDIMER" in the last 72 hours. Hgb A1c: No results for input(s): "HGBA1C" in the last 72 hours. Lipid Profile: No results for input(s): "CHOL", "HDL", "LDLCALC", "TRIG", "CHOLHDL", "LDLDIRECT" in the last 72 hours. Thyroid function studies: No results for input(s): "TSH", "T4TOTAL", "T3FREE", "THYROIDAB" in the last 72 hours.  Invalid input(s): "FREET3" Anemia work up: No results for input(s): "VITAMINB12", "FOLATE", "FERRITIN", "TIBC", "IRON", "RETICCTPCT" in the last 72 hours. Sepsis Labs: Recent Labs  Lab 05/19/22 0549 05/20/22 0533  WBC 22.0* 8.6    Microbiology Recent Results (from the past 240 hour(s))  SARS Coronavirus 2 by RT PCR (hospital order, performed in [facility] hospital lab) *cepheid single result test* Anterior Nasal Swab     Status: None   Collection Time: 05/19/22  8:22 AM   Specimen: Anterior Nasal Swab  Result Value Ref Range Status   SARS Coronavirus 2 by RT PCR NEGATIVE NEGATIVE Final    Comment: (NOTE) SARS-CoV-2 target nucleic acids are NOT DETECTED.  The SARS-CoV-2 RNA is generally detectable in upper and lower respiratory specimens during the acute phase of infection. The lowest concentration of SARS-CoV-2 viral copies this assay can detect is 250 copies / mL. A negative result does not preclude SARS-CoV-2 infection and should not be used as the sole basis for treatment or other patient management decisions.  A negative result may occur with improper specimen collection / handling, submission of specimen other than nasopharyngeal swab, presence of viral mutation(s) within the areas targeted by this assay, and inadequate number of viral copies (<250 copies / mL). A negative result must be combined with clinical observations, patient history, and epidemiological information.  Fact Sheet for Patients:    RoadLapTop.co.za  Fact Sheet for Healthcare Providers: http://kim-miller.com/  This test is not yet approved or  cleared by the [place] FDA and has been authorized for detection and/or diagnosis of SARS-CoV-2 by FDA under an Emergency Use Authorization (EUA).  This EUA will remain in effect (meaning this test can be used) for the duration of the COVID-19 declaration under Section 564(b)(1) of the Act, 21 U.S.C. section 360bbb-3(b)(1), unless the authorization is terminated or revoked sooner.  Performed at [facility], [address] [place], [place] [address]   Gastrointestinal  Panel by PCR , Stool     Status: None   Collection Time: 05/19/22 10:15 PM   Specimen: Stool  Result Value Ref Range Status   Campylobacter species NOT DETECTED NOT DETECTED Final   Plesimonas shigelloides NOT DETECTED NOT DETECTED Final   Salmonella species NOT DETECTED NOT DETECTED Final   Yersinia enterocolitica NOT DETECTED NOT DETECTED Final   Vibrio species NOT DETECTED NOT DETECTED Final   Vibrio cholerae NOT DETECTED NOT DETECTED Final   Enteroaggregative E coli (EAEC) NOT DETECTED NOT DETECTED Final   Enteropathogenic E coli (EPEC) NOT DETECTED NOT DETECTED Final   Enterotoxigenic E coli (ETEC) NOT DETECTED NOT DETECTED Final   Shiga like toxin producing E coli (STEC) NOT DETECTED NOT DETECTED Final   Shigella/Enteroinvasive E coli (EIEC) NOT DETECTED NOT DETECTED Final   Cryptosporidium NOT DETECTED NOT DETECTED Final   Cyclospora cayetanensis NOT DETECTED NOT DETECTED Final   Entamoeba histolytica NOT DETECTED NOT DETECTED Final   Giardia lamblia NOT DETECTED NOT DETECTED Final   Adenovirus F40/41 NOT DETECTED NOT DETECTED Final   Astrovirus NOT DETECTED NOT DETECTED Final   Norovirus GI/GII NOT DETECTED NOT DETECTED Final   Rotavirus A NOT DETECTED NOT DETECTED Final   Sapovirus (I, II, IV, and V) NOT DETECTED NOT DETECTED Final     Comment: Performed at [facility], [address] [place], [place] [address]  C Difficile Quick Screen w PCR reflex     Status: None   Collection Time: 05/19/22 10:15 PM  Result Value Ref Range Status   C Diff antigen NEGATIVE NEGATIVE Final   C Diff toxin NEGATIVE NEGATIVE Final   C Diff interpretation No C. difficile detected.  Final    Comment: Performed at [facility], [address] [place], [place] [address]    Procedures and diagnostic studies:  CT Angio Abd/Pel W and/or Wo Contrast  Result Date: 05/19/2022 CLINICAL DATA:  Rectal bleeding. EXAM: CT ANGIOGRAPHY ABDOMEN AND PELVIS WITH CONTRAST AND WITHOUT CONTRAST TECHNIQUE: Multidetector CT imaging of the abdomen and pelvis was performed using the standard protocol during bolus administration of intravenous contrast. Multiplanar reconstructed images and MIPs were obtained and reviewed to evaluate the vascular anatomy. RADIATION DOSE REDUCTION: This exam was performed according to the departmental dose-optimization program which includes automated exposure control, adjustment of the mA and/or kV according to patient size and/or use of iterative reconstruction technique. CONTRAST:  OMNIPAQUE IOHEXOL 350 MG/ML SOLN COMPARISON:  None Available. FINDINGS: VASCULAR Aorta: Normally patent. No evidence of atherosclerosis, aneurysm or dissection. Celiac: Normally patent. Normal branching anatomy and branch vessel patency. SMA: Normally patent. 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 IMA: Normally patent. Inflow: Normally patent bilateral iliac arteries. Proximal Outflow: Normally patent bilateral common femoral arteries and femoral bifurcations. Veins: Venous phase imaging demonstrates normal patency of venous structures in the abdomen and pelvis. Review of the MIP images confirms the above findings. NON-VASCULAR Lower chest:  No acute abnormality.  Small hiatal hernia. Hepatobiliary: Evidence of hepatic steatosis. Normal appearance of gallbladder. No biliary ductal dilatation. Pancreas: Unremarkable. No pancreatic ductal dilatation or surrounding inflammatory changes. Spleen: Normal in size without focal abnormality. Adrenals/Urinary Tract: Adrenal glands are unremarkable. Kidneys are normal, without renal calculi, focal lesion, or hydronephrosis. Bladder is unremarkable. Stomach/Bowel: Bowel shows no evidence of obstruction, ileus, inflammation or lesion. The appendix is normal. No free intraperitoneal air. No evidence of active bleeding  into the gastrointestinal tract by CTA. Lymphatic: No enlarged abdominal or pelvic lymph nodes. Reproductive: Uterus and bilateral adnexa are unremarkable. Other: No abdominal wall hernia or abnormality. No abdominopelvic ascites. Musculoskeletal: No acute or significant osseous findings. IMPRESSION: 1. No active bleeding into the gastrointestinal tract. No bowel pathology identified by CT. 2. Evidence of hepatic steatosis. 3. Small hiatal hernia. Electronically Signed   By: Irish Lack M.D.   On: 05/19/2022 08:32               LOS: 0 days   Hance Caspers  [facility]   Pager on [email or web address]. If 7PM-7AM, please contact night-coverage at [email or web address]     05/20/2022, 4:26 PM

## 2022-05-21 DIAGNOSIS — K529 Noninfective gastroenteritis and colitis, unspecified: Secondary | ICD-10-CM | POA: Diagnosis not present

## 2022-05-21 DIAGNOSIS — K449 Diaphragmatic hernia without obstruction or gangrene: Secondary | ICD-10-CM

## 2022-05-21 DIAGNOSIS — K219 Gastro-esophageal reflux disease without esophagitis: Secondary | ICD-10-CM | POA: Diagnosis not present

## 2022-05-21 DIAGNOSIS — K625 Hemorrhage of anus and rectum: Secondary | ICD-10-CM | POA: Diagnosis not present

## 2022-05-21 LAB — CBC
HCT: 32.5 % — ABNORMAL LOW (ref 36.0–46.0)
Hemoglobin: 9.8 g/dL — ABNORMAL LOW (ref 12.0–15.0)
MCH: 21.5 pg — ABNORMAL LOW (ref 26.0–34.0)
MCHC: 30.2 g/dL (ref 30.0–36.0)
MCV: 71.4 fL — ABNORMAL LOW (ref 80.0–100.0)
Platelets: 344 10*3/uL (ref 150–400)
RBC: 4.55 MIL/uL (ref 3.87–5.11)
RDW: 16 % — ABNORMAL HIGH (ref 11.5–15.5)
WBC: 15.8 10*3/uL — ABNORMAL HIGH (ref 4.0–10.5)
nRBC: 0 % (ref 0.0–0.2)

## 2022-05-21 LAB — CELIAC DISEASE PANEL
Endomysial Ab, IgA: NEGATIVE
IgA: 152 mg/dL (ref 87–352)
Tissue Transglutaminase Ab, IgA: <2 U/mL (ref 0–3)

## 2022-05-21 NOTE — Plan of Care (Signed)

## 2022-05-21 NOTE — Progress Notes (Signed)
Discharge instructions provided to patient. IV removed by this RN.

## 2022-05-21 NOTE — Progress Notes (Signed)
[facility] Gastroenterology Inpatient Progress Note    Subjective: Patient seen for follow up of acute colitis with hematochezia. Patient without further bleeding or abdominal pain. Stools have started to form but are still somewhat loose. Patient tolerating diet without difficulty.  Objective: Vital signs in last 24 hours: Temp:  [97.8 F (36.6 C)-98.5 F (36.9 C)] 98.3 F (36.8 C) (09/03 0914) Pulse Rate:  [95-108] 108 (09/03 0914) Resp:  [16-19] 18 (09/03 0914) BP: (129-145)/(80-91) 138/89 (09/03 0914) SpO2:  [99 %-100 %] 100 % (09/03 0914) Blood pressure 138/89, pulse (!) 108, temperature 98.3 F (36.8 C), resp. rate 18, height 5\' 3"  (1.6 m), weight 90.7 kg, last menstrual period 05/19/2022, SpO2 100 %, currently breastfeeding.    Intake/Output from previous day: 09/02 0701 - 09/03 0700 In: 382 [P.O.:180; I.V.:202] Out: -   Intake/Output this shift: Total I/O In: 240 [P.O.:240] Out: -    Gen: NAD. Appears comfortable.  HEENT: [place]/AT. PERRLA. Normal external ear exam.  Chest: CTA, no wheezes.  CV: RR nl S1, S2. No gallops.  Abd: soft, nt, nd. BS+  Ext: no edema. Pulses 2+  Neuro: Alert and oriented. Judgement appears normal. Nonfocal.   Lab Results: Results for orders placed or performed during the hospital encounter of 05/19/22 (from the past 24 hour(s))  Hemoglobin and hematocrit, blood     Status: Abnormal   Collection Time: 05/20/22  1:48 PM  Result Value Ref Range   Hemoglobin 9.6 (L) 12.0 - 15.0 g/dL   HCT 07/20/22 (L) 93.2 - 67.1 %  CBC     Status: Abnormal   Collection Time: 05/21/22  4:35 AM  Result Value Ref Range   WBC 15.8 (H) 4.0 - 10.5 K/uL   RBC 4.55 3.87 - 5.11 MIL/uL   Hemoglobin 9.8 (L) 12.0 - 15.0 g/dL   HCT 07/21/22 (L) 80.9 - 98.3 %   MCV 71.4 (L) 80.0 - 100.0 fL   MCH 21.5 (L) 26.0 - 34.0 pg   MCHC 30.2 30.0 - 36.0 g/dL   RDW 38.2 (H) 50.5 - 39.7 %   Platelets 344 150 - 400 K/uL   nRBC 0.0 0.0 - 0.2 %     Recent Labs     05/19/22 0549 05/19/22 1400 05/20/22 0533 05/20/22 1348 05/21/22 0435  WBC 22.0*  --  8.6  --  15.8*  HGB 10.9*   < > 9.9*  9.8* 9.6* 9.8*  HCT 36.7   < > 32.9*  32.2* 31.9* 32.5*  PLT 389  --  366  --  344   < > = values in this interval not displayed.   BMET Recent Labs    05/19/22 0549  NA 137  K 3.6  CL 106  CO2 21*  GLUCOSE 133*  BUN 11  CREATININE 0.81  CALCIUM 9.0   LFT Recent Labs    05/19/22 0549  PROT 8.3*  ALBUMIN 4.2  AST 32  ALT 22  ALKPHOS 114  BILITOT 0.6   PT/INR No results for input(s): "LABPROT", "INR" in the last 72 hours. Hepatitis Panel No results for input(s): "HEPBSAG", "HCVAB", "HEPAIGM", "HEPBIGM" in the last 72 hours. C-Diff Recent Labs    05/19/22 2215  CDIFFTOX NEGATIVE   No results for input(s): "CDIFFPCR" in the last 72 hours.   Studies/Results: No results found.  Scheduled Inpatient Medications:    FLUoxetine  40 mg Oral QHS   pantoprazole  40 mg Oral Daily    Continuous Inpatient Infusions:    PRN  Inpatient Medications:  acetaminophen, hydrOXYzine, ondansetron **OR** ondansetron (ZOFRAN) IV, mouth rinse  Miscellaneous: N/A  Assessment:  Acute colitis with diarrhea and hematochezia - s/p EGD and colonoscoyp on 05/20/22. EGD unremarkable. Colonoscopy with significant sigmoid colitis with patchy ulcerations consistent with infection vs. Ischemia. Biopsies pending. Inflammatory bowel disease not considered likely in this setting.  Abdominal pain - resolved. Hematochezia - resolved, related to #1.  Plan:  Follow up pathology results. Will communicate findings with patient. Advance diet as tolerated. Patient advised to continue soft diet an additional 24-48 hours. Okay to discharge from a GI standpoint. Patient advised to call me in the interim with any problems. Otherwise follow up as needed with GI.  Auda Finfrock K. Norma Fredrickson, M.D. 05/21/2022, 11:37 AM

## 2022-05-21 NOTE — Discharge Summary (Signed)
Physician Discharge Summary   Patient: [name] MRN: [id number] DOB: May 26, 1986  Admit date:     05/19/2022  Discharge date: 05/21/22  Discharge Physician: Lurene Shadow   PCP: Donita Brooks, MD   Recommendations at discharge:   Follow-up with PCP in 1 week Follow-up with gastroenterologist for pathology results from colonoscopy  Discharge Diagnoses: Principal Problem:   Rectal bleeding Active Problems:   Hiatal hernia with GERD   Anxiety   Obesity (BMI 30-39.9)   Acute colitis   Leukocytosis  Resolved Problems:   * No resolved hospital problems. [facility] Course:  [name] is a 35 y.o. female with medical history significant for hiatal hernia, hypertension, anxiety disorder, history of possible IBS (not diagnosed), who presented to the hospital because of rectal bleeding, diarrhea and abdominal pain.   She was admitted to the hospital for rectal bleeding.  She was treated with IV fluids and analgesics.  She underwent EGD and colonoscopy.  EGD showed hiatal hernia and colonoscopy showed severe sigmoid colitis.  Rectal bleeding has resolved.  She does not have any abdominal pain.  She feels better and she is okay for discharge from gastroenterologist's standpoint.         Consultants: Gastroenterologist Procedures performed: EGD and colonoscopy Disposition: Home Diet recommendation:  Discharge Diet Orders (From admission, onward)     Start     Ordered   05/21/22 0000  Diet - low sodium heart healthy        05/21/22 1308           Cardiac diet DISCHARGE MEDICATION: Allergies as of 05/21/2022       Reactions   Amoxicillin Itching   Has patient had a PCN reaction causing immediate rash, facial/tongue/throat swelling, SOB or lightheadedness with hypotension: No Has patient had a PCN reaction causing severe rash involving mucus membranes or skin necrosis: No Has patient had a PCN reaction that required hospitalization: No Has patient had a PCN  reaction occurring within the last 10 years: No If all of the above answers are "NO", then may proceed with Cephalosporin use.   Keflex [cephalexin] Itching        Medication List     STOP taking these medications    celecoxib 200 MG capsule Commonly known as: CeleBREX   diphenhydrAMINE 25 MG tablet Commonly known as: BENADRYL   esomeprazole 20 MG capsule Commonly known as: NEXIUM   prenatal multivitamin Tabs tablet   traZODone 50 MG tablet Commonly known as: DESYREL       TAKE these medications    acetaminophen 500 MG tablet Commonly known as: TYLENOL Take 1,000 mg by mouth every 6 (six) hours as needed (for pain).   cetirizine 10 MG tablet Commonly known as: ZYRTEC Take 10 mg by mouth every evening.   FLUoxetine 40 MG capsule Commonly known as: PROzac Take 1 capsule (40 mg total) by mouth daily.   hydrOXYzine 25 MG tablet Commonly known as: ATARAX Take 1 tablet (25 mg total) by mouth every 6 (six) hours as needed for anxiety.   omeprazole 20 MG capsule Commonly known as: PRILOSEC Take 40 mg by mouth daily.        Discharge Exam: Filed Weights   05/19/22 0548  Weight: 90.7 kg   GEN: NAD SKIN: No rash EYES: EOMI ENT: MMM CV: RRR PULM: CTA B ABD: soft, obese, NT, +BS CNS: AAO x 3, non focal EXT: No edema or tenderness   Condition at discharge: good  The results of significant diagnostics from this hospitalization (including imaging, microbiology, ancillary and laboratory) are listed below for reference.   Imaging Studies: CT Angio Abd/Pel W and/or Wo Contrast  Result Date: 05/19/2022 CLINICAL DATA:  Rectal bleeding. EXAM: CT ANGIOGRAPHY ABDOMEN AND PELVIS WITH CONTRAST AND WITHOUT CONTRAST TECHNIQUE: Multidetector CT imaging of the abdomen and pelvis was performed using the standard protocol during bolus administration of intravenous contrast. Multiplanar reconstructed images and MIPs were obtained and reviewed to evaluate the vascular  anatomy. RADIATION DOSE REDUCTION: This exam was performed according to the departmental dose-optimization program which includes automated exposure control, adjustment of the mA and/or kV according to patient size and/or use of iterative reconstruction technique. CONTRAST:  OMNIPAQUE IOHEXOL 350 MG/ML SOLN COMPARISON:  None Available. FINDINGS: VASCULAR Aorta: Normally patent. No evidence of atherosclerosis, aneurysm or dissection. Celiac: Normally patent. Normal branching anatomy and branch vessel patency. SMA: Normally patent. 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 IMA: Normally patent. Inflow: Normally patent bilateral iliac arteries. Proximal Outflow: Normally patent bilateral common femoral arteries and femoral bifurcations. Veins: Venous phase imaging demonstrates normal patency of venous structures in the abdomen and pelvis. Review of the MIP images confirms the above findings. NON-VASCULAR Lower chest: No acute abnormality.  Small hiatal hernia. Hepatobiliary: Evidence of hepatic steatosis. Normal appearance of gallbladder. No biliary ductal dilatation. Pancreas: Unremarkable. No pancreatic ductal dilatation or surrounding inflammatory changes. Spleen: Normal in size without focal abnormality. Adrenals/Urinary Tract: Adrenal glands are unremarkable. Kidneys are normal, without renal calculi, focal lesion, or hydronephrosis. Bladder is unremarkable. Stomach/Bowel: Bowel shows no evidence of obstruction, ileus, inflammation or lesion. The appendix is normal. No free intraperitoneal air. No evidence of active bleeding into the gastrointestinal tract by CTA. Lymphatic: No enlarged abdominal or pelvic lymph nodes. Reproductive: Uterus and bilateral adnexa are unremarkable. Other: No abdominal wall hernia or abnormality. No abdominopelvic ascites. Musculoskeletal: No acute or significant osseous findings.  IMPRESSION: 1. No active bleeding into the gastrointestinal tract. No bowel pathology identified by CT. 2. Evidence of hepatic steatosis. 3. Small hiatal hernia. Electronically Signed   By: Irish Lack M.D.   On: 05/19/2022 08:32    Microbiology: Results for orders placed or performed during the hospital encounter of 05/19/22  SARS Coronavirus 2 by RT PCR (hospital order, performed in [facility] hospital lab) *cepheid single result test* Anterior Nasal Swab     Status: None   Collection Time: 05/19/22  8:22 AM   Specimen: Anterior Nasal Swab  Result Value Ref Range Status   SARS Coronavirus 2 by RT PCR NEGATIVE NEGATIVE Final    Comment: (NOTE) SARS-CoV-2 target nucleic acids are NOT DETECTED.  The SARS-CoV-2 RNA is generally detectable in upper and lower respiratory specimens during the acute phase of infection. The lowest concentration of SARS-CoV-2 viral copies this assay can detect is 250 copies / mL. A negative result does not preclude SARS-CoV-2 infection and should not be used as the sole basis for treatment or other patient management decisions.  A negative result may occur with improper specimen collection / handling, submission of specimen other than nasopharyngeal swab, presence of viral mutation(s) within the areas targeted by this assay, and inadequate number of viral copies (<250 copies / mL). A negative result must be combined with clinical observations, patient history, and epidemiological information.  Fact Sheet for Patients:   RoadLapTop.co.za  Fact Sheet for Healthcare Providers: http://kim-miller.com/  This test  is not yet approved or  cleared by the [place] and has been authorized for detection and/or diagnosis of SARS-CoV-2 by FDA under an Emergency Use Authorization (EUA).  This EUA will remain in effect (meaning this test can be used) for the duration of the COVID-19 declaration under Section  564(b)(1) of the Act, 21 U.S.C. section 360bbb-3(b)(1), unless the authorization is terminated or revoked sooner.  Performed at [facility], [address] [place], [place] [address]   Gastrointestinal Panel by PCR , Stool     Status: None   Collection Time: 05/19/22 10:15 PM   Specimen: Stool  Result Value Ref Range Status   Campylobacter species NOT DETECTED NOT DETECTED Final   Plesimonas shigelloides NOT DETECTED NOT DETECTED Final   Salmonella species NOT DETECTED NOT DETECTED Final   Yersinia enterocolitica NOT DETECTED NOT DETECTED Final   Vibrio species NOT DETECTED NOT DETECTED Final   Vibrio cholerae NOT DETECTED NOT DETECTED Final   Enteroaggregative E coli (EAEC) NOT DETECTED NOT DETECTED Final   Enteropathogenic E coli (EPEC) NOT DETECTED NOT DETECTED Final   Enterotoxigenic E coli (ETEC) NOT DETECTED NOT DETECTED Final   Shiga like toxin producing E coli (STEC) NOT DETECTED NOT DETECTED Final   Shigella/Enteroinvasive E coli (EIEC) NOT DETECTED NOT DETECTED Final   Cryptosporidium NOT DETECTED NOT DETECTED Final   Cyclospora cayetanensis NOT DETECTED NOT DETECTED Final   Entamoeba histolytica NOT DETECTED NOT DETECTED Final   Giardia lamblia NOT DETECTED NOT DETECTED Final   Adenovirus F40/41 NOT DETECTED NOT DETECTED Final   Astrovirus NOT DETECTED NOT DETECTED Final   Norovirus GI/GII NOT DETECTED NOT DETECTED Final   Rotavirus A NOT DETECTED NOT DETECTED Final   Sapovirus (I, II, IV, and V) NOT DETECTED NOT DETECTED Final    Comment: Performed at [facility], [address] [place], [place] [address]  C Difficile Quick Screen w PCR reflex     Status: None   Collection Time: 05/19/22 10:15 PM  Result Value Ref Range Status   C Diff antigen NEGATIVE NEGATIVE Final   C Diff toxin NEGATIVE NEGATIVE Final   C Diff interpretation No C. difficile detected.  Final    Comment: Performed at [facility], [address] [place],  [place] [address]    Labs: CBC: Recent Labs  Lab 05/19/22 0549 05/19/22 1400 05/19/22 2150 05/20/22 0533 05/20/22 1348 05/21/22 0435  WBC 22.0*  --   --  8.6  --  15.8*  NEUTROABS 20.3*  --   --  6.0  --   --   HGB 10.9* 10.1* 9.8* 9.9*  9.8* 9.6* 9.8*  HCT 36.7 33.0* 32.2* 32.9*  32.2* 31.9* 32.5*  MCV 72.2*  --   --  71.8*  --  71.4*  PLT 389  --   --  366  --  344   Basic Metabolic Panel: Recent Labs  Lab 05/19/22 0549  NA 137  K 3.6  CL 106  CO2 21*  GLUCOSE 133*  BUN 11  CREATININE 0.81  CALCIUM 9.0   Liver Function Tests: Recent Labs  Lab 05/19/22 0549  AST 32  ALT 22  ALKPHOS 114  BILITOT 0.6  PROT 8.3*  ALBUMIN 4.2   CBG: No results for input(s): "GLUCAP" in the last 168 hours.  Discharge time spent: greater than 30 minutes.  Signed: Lurene Shadow, MD [facility] 05/21/2022

## 2022-05-23 ENCOUNTER — Encounter: Payer: Self-pay | Admitting: Internal Medicine

## 2022-05-24 LAB — CALPROTECTIN, FECAL: Calprotectin, Fecal: 405 ug/g — ABNORMAL HIGH (ref 0–120)

## 2022-05-24 LAB — SURGICAL PATHOLOGY

## 2022-06-12 ENCOUNTER — Encounter: Payer: Self-pay | Admitting: Internal Medicine

## 2022-07-11 NOTE — Progress Notes (Signed)
+   with rectal bleeding- stool studies order to evaluate for infectious cause.

## 2022-11-30 ENCOUNTER — Telehealth: Payer: Managed Care, Other (non HMO) | Admitting: Family Medicine

## 2022-11-30 DIAGNOSIS — R3989 Other symptoms and signs involving the genitourinary system: Secondary | ICD-10-CM | POA: Diagnosis not present

## 2022-11-30 MED ORDER — SULFAMETHOXAZOLE-TRIMETHOPRIM 800-160 MG PO TABS: 1.0000 | ORAL_TABLET | Freq: Two times a day (BID) | ORAL | 0 refills | Status: AC

## 2022-11-30 MED ORDER — PHENAZOPYRIDINE HCL 100 MG PO TABS: 100.0000 mg | ORAL_TABLET | Freq: Three times a day (TID) | ORAL | 0 refills | Status: DC | PRN

## 2022-12-04 ENCOUNTER — Encounter: Payer: Self-pay | Admitting: Internal Medicine

## 2022-12-07 ENCOUNTER — Telehealth: Payer: Managed Care, Other (non HMO) | Admitting: Nurse Practitioner

## 2022-12-07 DIAGNOSIS — N368 Other specified disorders of urethra: Secondary | ICD-10-CM | POA: Diagnosis not present

## 2022-12-07 NOTE — Progress Notes (Signed)
Virtual Visit Consent   [name], you are scheduled for a virtual visit with a [facility] provider today. Just as with appointments in the office, your consent must be obtained to participate. Your consent will be active for this visit and any virtual visit you may have with one of our providers in the next 365 days. If you have a MyChart account, a copy of this consent can be sent to you electronically.  As this is a virtual visit, video technology does not allow for your provider to perform a traditional examination. This may limit your provider's ability to fully assess your condition. If your provider identifies any concerns that need to be evaluated in person or the need to arrange testing (such as labs, EKG, etc.), we will make arrangements to do so. Although advances in technology are sophisticated, we cannot ensure that it will always work on either your end or our end. If the connection with a video visit is poor, the visit may have to be switched to a telephone visit. With either a video or telephone visit, we are not always able to ensure that we have a secure connection.  By engaging in this virtual visit, you consent to the provision of healthcare and authorize for your insurance to be billed (if applicable) for the services provided during this visit. Depending on your insurance coverage, you may receive a charge related to this service.  I need to obtain your verbal consent now. Are you willing to proceed with your visit today? [name] has provided verbal consent on 12/07/2022 for a virtual visit (video or telephone). Apolonio Schneiders, FNP  Date: 12/07/2022 12:35 PM  Virtual Visit via Video Note   I, Apolonio Schneiders, connected with  [name]  ([id number], September 02, 1986) on 12/07/22 at 12:45 PM EDT by a video-enabled telemedicine application and verified that I am speaking with the correct person using two identifiers.  Location: Patient: Virtual Visit  Location Patient: Home Provider: Virtual Visit Location Provider: Home Office   I discussed the limitations of evaluation and management by telemedicine and the availability of in person appointments. The patient expressed understanding and agreed to proceed.    History of Present Illness:  [name] is a 37 y.o. who identifies as a female who was assigned female at birth, and is being seen today for symptoms of urinary irritation  Last week she had a VV 11/30/22 and was treated with 5 days of Bactrim and Pyridium for suspected UTI She had improvement of symptoms but does not feel that her symptoms completely resolved  Symptoms at that time were burning with urination and inability to fully void  Symptoms today are some "irritation" with urinating / no vaginal discharge, no burning  She had a similar experience about 1.5 years ago  Was started on Macrobid initially at that time Transitioned to Bactrim due to ongoing symptoms  Had a Urine Culture that was negative at that time Given Diflucan and then Cipro and finally had resolution of symptoms  (Cipro was started while awaiting Urine Culture that returned negative)   She was sent to a urologist and had negative testing at that time and was diagnosed with irritation to her urethra post UTI no kidney disease or bladder disorders   Also finds correlation to having COVID just prior to her UTI 1.5 years ago and was also sick with URI symptoms prior to this episode as well- possible COVID was not tested   No  back pain No fever good appetite drinking 4 bottles of water daily   She has been off antibiotics for the past 48 hours and feels her symptoms have improved overall    Problems:  Patient Active Problem List   Diagnosis Date Noted   Acute colitis 05/20/2022   Leukocytosis 05/20/2022   Rectal bleeding 05/19/2022   Obesity (BMI 30-39.9) 05/19/2022   Uterine scar from previous cesarean delivery affecting pregnancy 05/09/2018    Indication for care in labor and delivery, antepartum 05/09/2018   Pregnancy 04/24/2018   Hematuria 04/15/2018   Polyhydramnios affecting pregnancy in third trimester 03/20/2018   Anemia in pregnancy, third trimester 03/13/2018   Chronic hypertension during pregnancy, antepartum 11/29/2017   Anxiety 11/29/2017   Hiatal hernia with GERD 10/04/2017   Mitral valve disorder 10/04/2017   Palpitations 10/04/2017   Scoliosis deformity of spine 10/04/2017   Supervision of high risk pregnancy, antepartum 09/25/2017   History of gestational hypertension 09/25/2017   History of cesarean delivery, currently pregnant 09/25/2017    Allergies:  Allergies  Allergen Reactions   Amoxicillin Itching    Has patient had a PCN reaction causing immediate rash, facial/tongue/throat swelling, SOB or lightheadedness with hypotension: No Has patient had a PCN reaction causing severe rash involving mucus membranes or skin necrosis: No Has patient had a PCN reaction that required hospitalization: No Has patient had a PCN reaction occurring within the last 10 years: No If all of the above answers are "NO", then may proceed with Cephalosporin use.    Keflex [Cephalexin] Itching   Medications:  Current Outpatient Medications:    acetaminophen (TYLENOL) 500 MG tablet, Take 1,000 mg by mouth every 6 (six) hours as needed (for pain)., Disp: , Rfl:    cetirizine (ZYRTEC) 10 MG tablet, Take 10 mg by mouth every evening. , Disp: , Rfl:    FLUoxetine (PROZAC) 40 MG capsule, Take 1 capsule (40 mg total) by mouth daily., Disp: 30 capsule, Rfl: 11   hydrOXYzine (ATARAX) 25 MG tablet, Take 1 tablet (25 mg total) by mouth every 6 (six) hours as needed for anxiety., Disp: 30 tablet, Rfl: 6   omeprazole (PRILOSEC) 20 MG capsule, Take 40 mg by mouth daily., Disp: , Rfl:    phenazopyridine (PYRIDIUM) 100 MG tablet, Take 1 tablet (100 mg total) by mouth 3 (three) times daily as needed for pain., Disp: 10 tablet, Rfl:  0  Observations/Objective: Patient is well-developed, well-nourished in no acute distress.  Resting comfortably  at home.  Head is normocephalic, atraumatic.  No labored breathing.  Speech is clear and coherent with logical content.  Patient is alert and oriented at baseline.    Assessment and Plan: 1. Urethral irritation Symptoms seem consistent with last UTI that resulted in residual irritation without resistant UTI  Advised to call PCP and request Urine culture/follow up if possible to assure resolution of suspected infection   Advised increasing water intake  May use OTC advil for inflammatory relief as needed.   If symptoms persist or worsen, with onset of fever, back pain seek immediate follow up in person      Follow Up Instructions: I discussed the assessment and treatment plan with the patient. The patient was provided an opportunity to ask questions and all were answered. The patient agreed with the plan and demonstrated an understanding of the instructions.  A copy of instructions were sent to the patient via MyChart unless otherwise noted below.    The patient was advised to call back  or seek an in-person evaluation if the symptoms worsen or if the condition fails to improve as anticipated.  Time:  I spent 15 minutes with the patient via telehealth technology discussing the above problems/concerns.    Apolonio Schneiders, FNP

## 2023-03-01 ENCOUNTER — Telehealth: Payer: Managed Care, Other (non HMO) | Admitting: Family Medicine

## 2023-03-01 DIAGNOSIS — R3989 Other symptoms and signs involving the genitourinary system: Secondary | ICD-10-CM

## 2023-03-01 MED ORDER — SULFAMETHOXAZOLE-TRIMETHOPRIM 800-160 MG PO TABS: 1.0000 | ORAL_TABLET | Freq: Two times a day (BID) | ORAL | 0 refills | Status: AC

## 2023-03-01 NOTE — Progress Notes (Signed)
Virtual Visit Consent   [name], you are scheduled for a virtual visit with a [facility] provider today. Just as with appointments in the office, your consent must be obtained to participate. Your consent will be active for this visit and any virtual visit you may have with one of our providers in the next 365 days. If you have a MyChart account, a copy of this consent can be sent to you electronically.  As this is a virtual visit, video technology does not allow for your provider to perform a traditional examination. This may limit your provider's ability to fully assess your condition. If your provider identifies any concerns that need to be evaluated in person or the need to arrange testing (such as labs, EKG, etc.), we will make arrangements to do so. Although advances in technology are sophisticated, we cannot ensure that it will always work on either your end or our end. If the connection with a video visit is poor, the visit may have to be switched to a telephone visit. With either a video or telephone visit, we are not always able to ensure that we have a secure connection.  By engaging in this virtual visit, you consent to the provision of healthcare and authorize for your insurance to be billed (if applicable) for the services provided during this visit. Depending on your insurance coverage, you may receive a charge related to this service.  I need to obtain your verbal consent now. Are you willing to proceed with your visit today? Mikaelah Kenlynn Delgardo has provided verbal consent on 03/01/2023 for a virtual visit (video or telephone). [name], NP  Date: 03/01/2023 10:20 AM  Virtual Visit via Video Note   I, [name], connected with  [name]  ([id number], 1986/02/03) on 03/01/23 at 10:30 AM EDT by a video-enabled telemedicine application and verified that I am speaking with the correct person using two identifiers.  Location: Patient: Virtual Visit  Location Patient: Home Provider: Virtual Visit Location Provider: Home Office   I discussed the limitations of evaluation and management by telemedicine and the availability of in person appointments. The patient expressed understanding and agreed to proceed.    History of Present Illness: [name] is a 37 y.o. who identifies as a female who was assigned female at birth, and is being seen today for dysuria  Onset was has been on and off- with the last few days has increased.  Associated symptoms are burning off and on, increased frequency at times, irration times Modifying factors are increased water, underwear adjust to not be tight Denies n/v, pelvic pain, fevers, chills  Home test- leukocytes-no nitrates or bacteria  Problems:  Patient Active Problem List   Diagnosis Date Noted   Acute colitis 05/20/2022   Leukocytosis 05/20/2022   Rectal bleeding 05/19/2022   Obesity (BMI 30-39.9) 05/19/2022   Uterine scar from previous cesarean delivery affecting pregnancy 05/09/2018   Indication for care in labor and delivery, antepartum 05/09/2018   Pregnancy 04/24/2018   Hematuria 04/15/2018   Polyhydramnios affecting pregnancy in third trimester 03/20/2018   Anemia in pregnancy, third trimester 03/13/2018   Chronic hypertension during pregnancy, antepartum 11/29/2017   Anxiety 11/29/2017   Hiatal hernia with GERD 10/04/2017   Mitral valve disorder 10/04/2017   Palpitations 10/04/2017   Scoliosis deformity of spine 10/04/2017   Supervision of high risk pregnancy, antepartum 09/25/2017   History of gestational hypertension 09/25/2017   History of cesarean delivery, currently pregnant  09/25/2017    Allergies:  Allergies  Allergen Reactions   Amoxicillin Itching    Has patient had a PCN reaction causing immediate rash, facial/tongue/throat swelling, SOB or lightheadedness with hypotension: No Has patient had a PCN reaction causing severe rash involving mucus membranes or  skin necrosis: No Has patient had a PCN reaction that required hospitalization: No Has patient had a PCN reaction occurring within the last 10 years: No If all of the above answers are "NO", then may proceed with Cephalosporin use.    Keflex [Cephalexin] Itching   Medications:  Current Outpatient Medications:    acetaminophen (TYLENOL) 500 MG tablet, Take 1,000 mg by mouth every 6 (six) hours as needed (for pain)., Disp: , Rfl:    cetirizine (ZYRTEC) 10 MG tablet, Take 10 mg by mouth every evening. , Disp: , Rfl:    FLUoxetine (PROZAC) 40 MG capsule, Take 1 capsule (40 mg total) by mouth daily., Disp: 30 capsule, Rfl: 11   hydrOXYzine (ATARAX) 25 MG tablet, Take 1 tablet (25 mg total) by mouth every 6 (six) hours as needed for anxiety., Disp: 30 tablet, Rfl: 6   omeprazole (PRILOSEC) 20 MG capsule, Take 40 mg by mouth daily., Disp: , Rfl:    phenazopyridine (PYRIDIUM) 100 MG tablet, Take 1 tablet (100 mg total) by mouth 3 (three) times daily as needed for pain., Disp: 10 tablet, Rfl: 0  Observations/Objective: Patient is well-developed, well-nourished in no acute distress.  Resting comfortably  at home.  Head is normocephalic, atraumatic.  No labored breathing.  Speech is clear and coherent with logical content.  Patient is alert and oriented at baseline.    Assessment and Plan:  1. Suspected UTI  - sulfamethoxazole-trimethoprim (BACTRIM DS) 800-160 MG tablet; Take 1 tablet by mouth 2 (two) times daily for 5 days.  Dispense: 10 tablet; Refill: 0   -no other red flags for stone or kidney infection -increase fluids -complete medication as discussed -prevention discussed and on AVS   Reviewed side effects, risks and benefits of medication.     Patient acknowledged agreement and understanding of the plan.    Past Medical, Surgical, Social History, Allergies, and Medications have been Reviewed     Follow Up Instructions: I discussed the assessment and treatment plan with the  patient. The patient was provided an opportunity to ask questions and all were answered. The patient agreed with the plan and demonstrated an understanding of the instructions.  A copy of instructions were sent to the patient via MyChart unless otherwise noted below.    The patient was advised to call back or seek an in-person evaluation if the symptoms worsen or if the condition fails to improve as anticipated.  Time:  I spent 10 minutes with the patient via telehealth technology discussing the above problems/concerns.    [name], NP

## 2023-03-01 NOTE — Patient Instructions (Signed)
  [name], thank you for joining Jenna Finner, NP for today's virtual visit.  While this provider is not your primary care provider (PCP), if your PCP is located in our provider database this encounter information will be shared with them immediately following your visit.   A [facility] MyChart account gives you access to today's visit and all your visits, tests, and labs performed at [facility] " click here if you don't have a [facility] MyChart account or go to [email or web address]  Consent: (Patient) [name] provided verbal consent for this virtual visit at the beginning of the encounter.  Current Medications:  Current Outpatient Medications:    sulfamethoxazole-trimethoprim (BACTRIM DS) 800-160 MG tablet, Take 1 tablet by mouth 2 (two) times daily for 5 days., Disp: 10 tablet, Rfl: 0   acetaminophen (TYLENOL) 500 MG tablet, Take 1,000 mg by mouth every 6 (six) hours as needed (for pain)., Disp: , Rfl:    cetirizine (ZYRTEC) 10 MG tablet, Take 10 mg by mouth every evening. , Disp: , Rfl:    FLUoxetine (PROZAC) 40 MG capsule, Take 1 capsule (40 mg total) by mouth daily., Disp: 30 capsule, Rfl: 11   hydrOXYzine (ATARAX) 25 MG tablet, Take 1 tablet (25 mg total) by mouth every 6 (six) hours as needed for anxiety., Disp: 30 tablet, Rfl: 6   omeprazole (PRILOSEC) 20 MG capsule, Take 40 mg by mouth daily., Disp: , Rfl:    Medications ordered in this encounter:  Meds ordered this encounter  Medications   sulfamethoxazole-trimethoprim (BACTRIM DS) 800-160 MG tablet    Sig: Take 1 tablet by mouth 2 (two) times daily for 5 days.    Dispense:  10 tablet    Refill:  0    Order Specific Question:   Supervising Provider    Answer:   Merrilee Jansky [id number]     *If you need refills on other medications prior to your next appointment, please contact your pharmacy*  Follow-Up: Call back or seek an in-person evaluation if the symptoms worsen or if  the condition fails to improve as anticipated.  [facility] [phone]  Other Instructions  Follow up with PCP for referral back to urology for check to see what might be going on    If you have been instructed to have an in-person evaluation today at a local Urgent Care facility, please use the link below. It will take you to a list of all of our available [facility], including address, phone number and hours of operation. Please do not delay care.  [facility]  If you or a family member do not have a primary care provider, use the link below to schedule a visit and establish care. When you choose a [facility] primary care physician or advanced practice provider, you gain a long-term partner in health. Find a Primary Care Provider  Learn more about [facility] in-office and virtual care options:  - Get Care Now
